# Patient Record
Sex: Female | Born: 1965 | Race: White | Hispanic: No | Marital: Married | Smoking: Never smoker
Health system: Southern US, Community
[De-identification: ages and names within clinical notes are randomized; demographics above are authoritative.]

## PROBLEM LIST (undated history)

## (undated) DIAGNOSIS — R1013 Epigastric pain: Secondary | ICD-10-CM

## (undated) DIAGNOSIS — B019 Varicella without complication: Secondary | ICD-10-CM

## (undated) DIAGNOSIS — R011 Cardiac murmur, unspecified: Secondary | ICD-10-CM

## (undated) DIAGNOSIS — R739 Hyperglycemia, unspecified: Secondary | ICD-10-CM

## (undated) DIAGNOSIS — E785 Hyperlipidemia, unspecified: Secondary | ICD-10-CM

## (undated) DIAGNOSIS — R42 Dizziness and giddiness: Secondary | ICD-10-CM

## (undated) DIAGNOSIS — R030 Elevated blood-pressure reading, without diagnosis of hypertension: Secondary | ICD-10-CM

## (undated) DIAGNOSIS — E039 Hypothyroidism, unspecified: Secondary | ICD-10-CM

## (undated) DIAGNOSIS — R001 Bradycardia, unspecified: Secondary | ICD-10-CM

## (undated) DIAGNOSIS — E559 Vitamin D deficiency, unspecified: Secondary | ICD-10-CM

## (undated) DIAGNOSIS — R7303 Prediabetes: Secondary | ICD-10-CM

## (undated) HISTORY — DX: Hypothyroidism, unspecified: E03.9

## (undated) HISTORY — DX: Bradycardia, unspecified: R00.1

## (undated) HISTORY — DX: Varicella without complication: B01.9

## (undated) HISTORY — DX: Hyperglycemia, unspecified: R73.9

## (undated) HISTORY — DX: Elevated blood-pressure reading, without diagnosis of hypertension: R03.0

## (undated) HISTORY — PX: OTHER SURGICAL HISTORY: SHX169

## (undated) HISTORY — DX: Hyperlipidemia, unspecified: E78.5

## (undated) HISTORY — DX: Vitamin D deficiency, unspecified: E55.9

## (undated) HISTORY — DX: Epigastric pain: R10.13

## (undated) HISTORY — DX: Cardiac murmur, unspecified: R01.1

## (undated) HISTORY — DX: Dizziness and giddiness: R42

---

## 2015-01-16 ENCOUNTER — Ambulatory Visit (INDEPENDENT_AMBULATORY_CARE_PROVIDER_SITE_OTHER): Payer: BLUE CROSS/BLUE SHIELD | Admitting: Adult Health

## 2015-01-16 ENCOUNTER — Encounter: Payer: Self-pay | Admitting: Adult Health

## 2015-01-16 VITALS — BP 132/90 | Temp 98.4°F | Ht 62.5 in | Wt 172.0 lb

## 2015-01-16 DIAGNOSIS — Z23 Encounter for immunization: Secondary | ICD-10-CM | POA: Diagnosis not present

## 2015-01-16 DIAGNOSIS — Z7189 Other specified counseling: Secondary | ICD-10-CM | POA: Diagnosis not present

## 2015-01-16 DIAGNOSIS — D1720 Benign lipomatous neoplasm of skin and subcutaneous tissue of unspecified limb: Secondary | ICD-10-CM

## 2015-01-16 DIAGNOSIS — Z7689 Persons encountering health services in other specified circumstances: Secondary | ICD-10-CM

## 2015-01-16 NOTE — Progress Notes (Signed)
HPI:  Brooke Khan is here to establish care. She is a very pleasant caucasian female who  has a past medical history of Chicken pox and Heart murmur.  Last PCP and physical: Unknown "years", she does see GYN. She has been living in [place] and [place] for the last 12 years.  Immunizations:UTD Diet: Cooks healthy. Trying to get rid of sweets in her diet.  Exercise:Tennis 3-4 times a week. Walks.  Colonoscopy:Not had one Pap Smear:10/2013 - Sees GYN Mammogram:10/2013 Dental: twice a year   Has the following chronic problems that require follow up and concerns today:  None  Has the following acute problems that require follow-up and concerns today:  1. " Swollen lymph node" in right axilla. Has been there for 1.5 years. No pain, redness, or warmth.   2. Mole on right shoulder that she has noticed has increased in size.   ROS negative for unless reported above: fevers, chills,feeling poorly, unintentional weight loss, hearing or vision loss, chest pain, palpitations, leg claudication, struggling to breath,Not feeling congested in the chest, no orthopenia, no cough,no wheezing, normal appetite, no soft tissue swelling, no hemoptysis, melena, hematochezia, hematuria, falls, loc, si, or thoughts of self harm.     Past Medical History  Diagnosis Date  . Chicken pox   . Heart murmur     History reviewed. No pertinent past surgical history.  Family History  Problem Relation Age of Onset  . Arthritis Maternal Grandfather   . Breast cancer Maternal Aunt   . Prostate cancer Father   . Elevated Lipids Father   . Elevated Lipids Mother   . Heart disease Father   . Hypertension Father   . Diabetes Father   . Diabetes Paternal Grandfather   . Diabetes Maternal Grandmother     Social History   Social History  . Marital Status: Unknown    Spouse Name: N/A  . Number of Children: N/A  . Years of Education: N/A   Social History Main Topics  . Smoking status: Never Smoker    . Smokeless tobacco: None  . Alcohol Use: 0.0 oz/week    0 Standard drinks or equivalent per week     Comment: occ; twice a month   . Drug Use: None  . Sexual Activity: Not Asked   Other Topics Concern  . None   Social History Narrative  . None     Current outpatient prescriptions:  Marland Kitchen  VITAMIN D, CHOLECALCIFEROL, PO, Take by mouth., Disp: , Rfl:   EXAM:   Filed Vitals:   01/16/15 1300  BP: 132/90  Temp: 98.4 F (36.9 C)    Body mass index is 30.94 kg/(m^2).  GENERAL: vitals reviewed and listed above, alert, oriented, appears well hydrated and in no acute distress  HEENT: atraumatic, conjunttiva clear, no obvious abnormalities on inspection of external nose and ears  NECK: Neck is soft and supple without masses, no adenopathy or thyromegaly, trachea midline, no JVD. Normal range of motion.   LUNGS: clear to auscultation bilaterally, no wheezes, rales or rhonchi, good air movement  CV: Regular rate and rhythm, normal S1/S2, soft murmur, no gallops, or rubs. No carotid bruit and no peripheral edema.   MS: moves all extremities without noticeable abnormality. No edema noted  Abd: soft/nontender/nondistended/normal bowel sounds   Skin: warm and dry, no rash. Small brown nevi on right upper arm, it does not look concerning at this point . Area of swelling in right axilla. Feels soft and fluctuant. No  pain or hardness. Does not appear as lymph node in nature. No redness or warmth noted.    Extremities: No clubbing, cyanosis, or edema. Capillary refill is WNL. Pulses intact bilaterally in upper and lower extremities.   Neuro: CN II-XII intact, sensation and reflexes normal throughout, 5/5 muscle strength in bilateral upper and lower extremities. Normal finger to nose. Normal rapid alternating movements.   PSYCH: pleasant and cooperative, no obvious depression or anxiety  ASSESSMENT AND PLAN: 1. Encounter to establish care - Follow up at next available appointment for  CPE - Follow up for any acute issue sooner, if needed - Ambulatory referral to Gastroenterology for Colonoscopy - Her blood pressure was slightly elevated at 132/90 in the office today. Will continue to monitor.   2. Lipoma of axilla - Likely Lipoma. Less likely lymphedema. No concern for abscess - Will continue to monitor.  - Consider consult to plastic surgery - Consider [place] in future.    3. Encounter for immunization - Flu vaccination given.    -We reviewed the PMH, PSH, FH, SH, Meds and Allergies. -We provided refills for any medications we will prescribe as needed. -We addressed current concerns per orders and patient instructions. -We have asked for records for pertinent exams, studies, vaccines and notes from previous providers. -We have advised patient to follow up per instructions below.   -Patient advised to return or notify a provider immediately if symptoms worsen or persist or new concerns arise.  Dorothyann Peng, AGNP

## 2015-01-16 NOTE — Progress Notes (Signed)
Pre visit review using our clinic review tool, if applicable. No additional management support is needed unless otherwise documented below in the visit note. 

## 2015-01-16 NOTE — Patient Instructions (Signed)
It was great meeting you today!  Please follow up in 1-2 months for a complete physical. If you need anything in the meantime, please let me know.   Continue to work on diet and exercise.

## 2015-03-14 ENCOUNTER — Other Ambulatory Visit (INDEPENDENT_AMBULATORY_CARE_PROVIDER_SITE_OTHER): Payer: BLUE CROSS/BLUE SHIELD

## 2015-03-14 DIAGNOSIS — R7989 Other specified abnormal findings of blood chemistry: Secondary | ICD-10-CM

## 2015-03-14 DIAGNOSIS — Z0000 Encounter for general adult medical examination without abnormal findings: Secondary | ICD-10-CM

## 2015-03-14 LAB — POCT URINALYSIS DIPSTICK
Bilirubin, UA: NEGATIVE
Blood, UA: NEGATIVE
Glucose, UA: NEGATIVE
Ketones, UA: NEGATIVE
Leukocytes, UA: NEGATIVE
Nitrite, UA: NEGATIVE
Protein, UA: NEGATIVE
Spec Grav, UA: 1.02
Urobilinogen, UA: 0.2
pH, UA: 7

## 2015-03-14 LAB — HEPATIC FUNCTION PANEL
ALT: 30 U/L (ref 0–35)
AST: 19 U/L (ref 0–37)
Albumin: 4.1 g/dL (ref 3.5–5.2)
Alkaline Phosphatase: 73 U/L (ref 39–117)
Bilirubin, Direct: 0.1 mg/dL (ref 0.0–0.3)
Total Bilirubin: 0.4 mg/dL (ref 0.2–1.2)
Total Protein: 7.3 g/dL (ref 6.0–8.3)

## 2015-03-14 LAB — BASIC METABOLIC PANEL
BUN: 18 mg/dL (ref 6–23)
CO2: 29 mEq/L (ref 19–32)
Calcium: 9.6 mg/dL (ref 8.4–10.5)
Chloride: 107 mEq/L (ref 96–112)
Creatinine, Ser: 0.76 mg/dL (ref 0.40–1.20)
GFR: 85.07 mL/min (ref >60.00)
Glucose, Bld: 104 mg/dL — ABNORMAL HIGH (ref 70–99)
Potassium: 4.4 mEq/L (ref 3.5–5.1)
Sodium: 143 mEq/L (ref 135–145)

## 2015-03-14 LAB — CBC WITH DIFFERENTIAL/PLATELET
Basophils Absolute: 0 10*3/uL (ref 0.0–0.1)
Basophils Relative: 0.6 % (ref 0.0–3.0)
Eosinophils Absolute: 0.4 10*3/uL (ref 0.0–0.7)
Eosinophils Relative: 5.3 % — ABNORMAL HIGH (ref 0.0–5.0)
HCT: 42.2 % (ref 36.0–46.0)
Hemoglobin: 14.2 g/dL (ref 12.0–15.0)
Lymphocytes Relative: 38.1 % (ref 12.0–46.0)
Lymphs Abs: 2.9 10*3/uL (ref 0.7–4.0)
MCHC: 33.7 g/dL (ref 30.0–36.0)
MCV: 90.5 fl (ref 78.0–100.0)
Monocytes Absolute: 0.4 10*3/uL (ref 0.1–1.0)
Monocytes Relative: 5.3 % (ref 3.0–12.0)
Neutro Abs: 3.9 10*3/uL (ref 1.4–7.7)
Neutrophils Relative %: 50.7 % (ref 43.0–77.0)
Platelets: 293 10*3/uL (ref 150.0–400.0)
RBC: 4.66 Mil/uL (ref 3.87–5.11)
RDW: 13.1 % (ref 11.5–15.5)
WBC: 7.7 10*3/uL (ref 4.0–10.5)

## 2015-03-14 LAB — LIPID PANEL
Cholesterol: 174 mg/dL (ref 0–200)
HDL: 37.1 mg/dL — ABNORMAL LOW (ref >39.00)
NonHDL: 136.6
Total CHOL/HDL Ratio: 5
Triglycerides: 215 mg/dL — ABNORMAL HIGH (ref 0.0–149.0)
VLDL: 43 mg/dL — ABNORMAL HIGH (ref 0.0–40.0)

## 2015-03-14 LAB — TSH: TSH: 5.86 u[IU]/mL — ABNORMAL HIGH (ref 0.35–4.50)

## 2015-03-14 LAB — LDL CHOLESTEROL, DIRECT: Direct LDL: 118 mg/dL

## 2015-03-20 ENCOUNTER — Encounter: Payer: Self-pay | Admitting: Adult Health

## 2015-03-20 ENCOUNTER — Ambulatory Visit (INDEPENDENT_AMBULATORY_CARE_PROVIDER_SITE_OTHER): Payer: BLUE CROSS/BLUE SHIELD | Admitting: Adult Health

## 2015-03-20 VITALS — BP 120/78 | HR 82 | Temp 98.6°F | Ht 63.19 in | Wt 175.9 lb

## 2015-03-20 DIAGNOSIS — R7989 Other specified abnormal findings of blood chemistry: Secondary | ICD-10-CM

## 2015-03-20 DIAGNOSIS — Z0000 Encounter for general adult medical examination without abnormal findings: Secondary | ICD-10-CM

## 2015-03-20 NOTE — Progress Notes (Signed)
Subjective:    Patient ID: Brooke Khan, female    DOB: 17-Aug-1965, 49 y.o.   MRN: [id number]  HPI  Patient presents for yearly preventative medicine examination. She  has a past medical history of Chicken pox and Heart murmur.  All immunizations and health maintenance protocols were reviewed with the patient and needed orders were placed.  Appropriate screening laboratory values were ordered for the patient including screening of hyperlipidemia, renal function and hepatic function.  Medication reconciliation,  past medical history, social history, problem list and allergies were reviewed in detail with the patient  Goals were established with regard to weight loss, exercise, and  diet in compliance with medications  End of life planning was discussed.- She does not have advanced directives or living will  She has been seen by her GYN and had her pap done recently.   Review of Systems  Constitutional: Negative.   HENT: Negative.   Eyes: Negative.   Respiratory: Negative.   Cardiovascular: Negative.   Gastrointestinal: Negative.   Endocrine: Negative.   Genitourinary: Negative.   Musculoskeletal: Negative.   Skin: Negative.   Allergic/Immunologic: Negative.   Neurological: Negative.   Hematological: Negative.   Psychiatric/Behavioral: Negative.   All other systems reviewed and are negative.  Past Medical History  Diagnosis Date  . Chicken pox   . Heart murmur     Social History   Social History  . Marital Status: Married    Spouse Name: N/A  . Number of Children: N/A  . Years of Education: N/A   Occupational History  . Not on file.   Social History Main Topics  . Smoking status: Never Smoker   . Smokeless tobacco: Not on file  . Alcohol Use: 0.0 oz/week    0 Standard drinks or equivalent per week     Comment: occ; twice a month   . Drug Use: No  . Sexual Activity: Not on file   Other Topics Concern  . Not on file   Social History Narrative   Does not work    Married for 26 years    Two children ( 75 and 65), both live in [place].    Has a beagle   She plays tennis and golf.     Past Surgical History  Procedure Laterality Date  . Cesarean section      Family History  Problem Relation Age of Onset  . Arthritis Maternal Grandfather   . Breast cancer Maternal Aunt   . Prostate cancer Father   . Elevated Lipids Father   . Elevated Lipids Mother   . Heart disease Father   . Hypertension Father   . Diabetes Father   . Diabetes Paternal Grandfather   . Diabetes Maternal Grandmother     Allergies  Allergen Reactions  . Codeine     Current Outpatient Prescriptions on File Prior to Visit  Medication Sig Dispense Refill  . VITAMIN D, CHOLECALCIFEROL, PO Take by mouth.     No current facility-administered medications on file prior to visit.    BP 120/78 mmHg  Pulse 82  Temp(Src) 98.6 F (37 C) (Oral)  Ht 5' 3.19" (1.605 m)  Wt 175 lb 14.4 oz (79.788 kg)  BMI 30.97 kg/m2  LMP 05/11/2014       Objective:   Physical Exam  Constitutional: She is oriented to person, place, and time. She appears well-developed and well-nourished. No distress.  Slightly obese  HENT:  Head: Normocephalic and atraumatic.  Right Ear:  External ear normal.  Left Ear: External ear normal.  Nose: Nose normal.  Mouth/Throat: Oropharynx is clear and moist. No oropharyngeal exudate.  Eyes: Conjunctivae and EOM are normal. Pupils are equal, round, and reactive to light. Right eye exhibits no discharge. Left eye exhibits no discharge. No scleral icterus.  Neck: Normal range of motion. Neck supple. No JVD present. No tracheal deviation present. No thyromegaly present.  Cardiovascular: Normal rate, regular rhythm, normal heart sounds and intact distal pulses.  Exam reveals no gallop and no friction rub.   No murmur heard. No carotid bruit  Pulmonary/Chest: Effort normal and breath sounds normal. No respiratory distress. She has no wheezes. She  has no rales. She exhibits no tenderness.  Abdominal: Soft. Bowel sounds are normal. She exhibits no distension and no mass. There is no tenderness. There is no rebound and no guarding.  Genitourinary:  Deferred, done by GYN  Musculoskeletal: Normal range of motion.  Lymphadenopathy:    She has no cervical adenopathy.  Neurological: She is alert and oriented to person, place, and time. She has normal reflexes. No cranial nerve deficit. Coordination normal.  Skin: Skin is warm and dry. No rash noted. She is not diaphoretic. No erythema. No pallor.  Scattered non malignant appearing moles on torso  Psychiatric: She has a normal mood and affect. Her behavior is normal. Judgment and thought content normal.  Nursing note and vitals reviewed.     Assessment & Plan:  1. Abnormal TSH - Has no symptoms of hypothyroidism - TSH; Future  2. Routine general medical examination at a health care facility - Follow up in one year for CPE - Follow up sooner if needed - Stressed importance of diet and exercise.  - She will schedule her colonoscopy.

## 2015-03-20 NOTE — Patient Instructions (Addendum)
It was great seeing you again!  Please follow up in 1-2 months to have your thyroid level checked again.   Call and schedule your colonoscopy  [facility]  Address: [address], [place], [place] [address]  [phone]   Follow up in one year for a complete physical exam or sooner if needed.

## 2015-03-20 NOTE — Progress Notes (Signed)
Pre visit review using our clinic review tool, if applicable. No additional management support is needed unless otherwise documented below in the visit note. 

## 2015-04-30 ENCOUNTER — Other Ambulatory Visit (INDEPENDENT_AMBULATORY_CARE_PROVIDER_SITE_OTHER): Payer: BLUE CROSS/BLUE SHIELD

## 2015-04-30 DIAGNOSIS — R7989 Other specified abnormal findings of blood chemistry: Secondary | ICD-10-CM

## 2015-04-30 LAB — TSH: TSH: 3.89 u[IU]/mL (ref 0.35–4.50)

## 2015-06-07 ENCOUNTER — Encounter: Payer: Self-pay | Admitting: Gastroenterology

## 2015-07-29 ENCOUNTER — Ambulatory Visit (AMBULATORY_SURGERY_CENTER): Payer: Self-pay | Admitting: *Deleted

## 2015-07-29 VITALS — Ht 62.0 in | Wt 167.0 lb

## 2015-07-29 DIAGNOSIS — Z1211 Encounter for screening for malignant neoplasm of colon: Secondary | ICD-10-CM

## 2015-07-29 NOTE — Progress Notes (Signed)
No egg or soy allergy known to patient  No issues with past sedation with any surgeries  or procedures, no intubation problems  No diet pills per patient No home 02 use per patient  No blood thinners per patient  Pt denies issues with constipation  emmi video declined

## 2015-08-05 ENCOUNTER — Ambulatory Visit (AMBULATORY_SURGERY_CENTER): Payer: BLUE CROSS/BLUE SHIELD | Admitting: Gastroenterology

## 2015-08-05 ENCOUNTER — Encounter: Payer: Self-pay | Admitting: Gastroenterology

## 2015-08-05 VITALS — BP 110/86 | HR 56 | Temp 99.1°F | Resp 10 | Ht 62.0 in | Wt 167.0 lb

## 2015-08-05 DIAGNOSIS — D123 Benign neoplasm of transverse colon: Secondary | ICD-10-CM | POA: Diagnosis not present

## 2015-08-05 DIAGNOSIS — Z1211 Encounter for screening for malignant neoplasm of colon: Secondary | ICD-10-CM | POA: Diagnosis not present

## 2015-08-05 MED ORDER — SODIUM CHLORIDE 0.9 % IV SOLN: 500.0000 mL | INTRAVENOUS | Status: DC

## 2015-08-05 NOTE — Progress Notes (Signed)
Called to room to assist during endoscopic procedure.  Patient ID and intended procedure confirmed with present staff. Received instructions for my participation in the procedure from the performing physician.  

## 2015-08-05 NOTE — Op Note (Signed)
[facility] Patient Name: Brooke Khan Procedure Date: 08/05/2015 11:37 AM MRN: [id number] Endoscopist: Mallie Mussel L. Loletha Carrow , MD Age: 50 Referring MD:  Date of Birth: 27-Apr-1966 Gender: Female Procedure:                Colonoscopy Indications:              Screening for colorectal malignant neoplasm Medicines:                Monitored Anesthesia Care Procedure:                Pre-Anesthesia Assessment:                           - Prior to the procedure, a History and Physical                            was performed, and patient medications and                            allergies were reviewed. The patient's tolerance of                            previous anesthesia was also reviewed. The risks                            and benefits of the procedure and the sedation                            options and risks were discussed with the patient.                            All questions were answered, and informed consent                            was obtained. Prior Anticoagulants: The patient has                            taken no previous anticoagulant or antiplatelet                            agents. ASA Grade Assessment: I - A normal, healthy                            patient. After reviewing the risks and benefits,                            the patient was deemed in satisfactory condition to                            undergo the procedure.                           After obtaining informed consent, the colonoscope  was passed under direct vision. Throughout the                            procedure, the patient's blood pressure, pulse, and                            oxygen saturations were monitored continuously. The                            Model PCF-H190L [id number]) scope was introduced                            through the anus and advanced to the the cecum,                            identified by appendiceal orifice and ileocecal                           valve. The colonoscopy was performed without                            difficulty. The patient tolerated the procedure                            well. The quality of the bowel preparation was                            good. The ileocecal valve, appendiceal orifice, and                            rectum were photographed. The bowel preparation                            used was Miralax. The quality of the bowel                            preparation was evaluated using the BBPS [facility]                            Bowel Preparation Scale) with scores of: Right                            Colon = 2, Transverse Colon = 2 and Left Colon = 2.                            The total BBPS score equals 6. Scope In: 11:51:20 AM Scope Out: 12:10:04 PM Scope Withdrawal Time: 0 hours 13 minutes 38 seconds  Total Procedure Duration: 0 hours 18 minutes 44 seconds  Findings:      The perianal and digital rectal examinations were normal.      A 4 mm polyp was found in the splenic flexure. The polyp was sessile.       The polyp was removed with a piecemeal technique using a cold biopsy  forceps. Resection and retrieval were complete.      The exam was otherwise without abnormality on direct and retroflexion       views. Complications:            No immediate complications. Estimated Blood Loss:     Estimated blood loss was minimal. Impression:               - One 4 mm polyp at the splenic flexure, removed                            piecemeal using a cold biopsy forceps. Resected and                            retrieved.                           - The examination was otherwise normal on direct                            and retroflexion views. Recommendation:           - Patient has a contact number available for                            emergencies. The signs and symptoms of potential                            delayed complications were discussed with the                             patient. Return to normal activities tomorrow.                            Written discharge instructions were provided to the                            patient.                           - Resume previous diet.                           - Continue present medications.                           - Await pathology results.                           - Repeat colonoscopy is recommended for                            surveillance. The colonoscopy date will be                            determined after pathology results from today's  exam become available for review. Procedure Code(s):        --- Professional ---                           [phone], Colonoscopy, flexible; with biopsy, single                            or multiple CPT copyright 2016 American Medical Association. All rights reserved. Amita Atayde L. Loletha Carrow, MD 08/05/2015 12:17:23 PM This report has been signed electronically. Number of Addenda: 0 Referring MD:      Dorothyann Peng

## 2015-08-05 NOTE — Progress Notes (Signed)
Report given to RN, vss

## 2015-08-05 NOTE — Patient Instructions (Signed)
YOU HAD AN ENDOSCOPIC PROCEDURE TODAY AT [facility]:   Refer to the procedure report that was given to you for any specific questions about what was found during the examination.  If the procedure report does not answer your questions, please call your gastroenterologist to clarify.  If you requested that your care partner not be given the details of your procedure findings, then the procedure report has been included in a sealed envelope for you to review at your convenience later.  YOU SHOULD EXPECT: Some feelings of bloating in the abdomen. Passage of more gas than usual.  Walking can help get rid of the air that was put into your GI tract during the procedure and reduce the bloating. If you had a lower endoscopy (such as a colonoscopy or flexible sigmoidoscopy) you may notice spotting of blood in your stool or on the toilet paper. If you underwent a bowel prep for your procedure, you may not have a normal bowel movement for a few days.  Please Note:  You might notice some irritation and congestion in your nose or some drainage.  This is from the oxygen used during your procedure.  There is no need for concern and it should clear up in a day or so.  SYMPTOMS TO REPORT IMMEDIATELY:   Following lower endoscopy (colonoscopy or flexible sigmoidoscopy):  Excessive amounts of blood in the stool  Significant tenderness or worsening of abdominal pains  Swelling of the abdomen that is new, acute  Fever of 100F or higher  For urgent or emergent issues, a gastroenterologist can be reached at any hour by calling [phone].   DIET: Your first meal following the procedure should be a small meal and then it is ok to progress to your normal diet. Heavy or fried foods are harder to digest and may make you feel nauseous or bloated.  Likewise, meals heavy in dairy and vegetables can increase bloating.  Drink plenty of fluids but you should avoid alcoholic beverages for 24  hours.  ACTIVITY:  You should plan to take it easy for the rest of today and you should NOT DRIVE or use heavy machinery until tomorrow (because of the sedation medicines used during the test).    FOLLOW UP: Our staff will call the number listed on your records the next business day following your procedure to check on you and address any questions or concerns that you may have regarding the information given to you following your procedure. If we do not reach you, we will leave a message.  However, if you are feeling well and you are not experiencing any problems, there is no need to return our call.  We will assume that you have returned to your regular daily activities without incident.  If any biopsies were taken you will be contacted by phone or by letter within the next 1-3 weeks.  Please call us at [phone] if you have not heard about the biopsies in 3 weeks.    SIGNATURES/CONFIDENTIALITY: You and/or your care partner have signed paperwork which will be entered into your electronic medical record.  These signatures attest to the fact that that the information above on your After Visit Summary has been reviewed and is understood.  Full responsibility of the confidentiality of this discharge information lies with you and/or your care-partner.  Polyp handout given Await pathology results Resume previous medication and diet

## 2015-08-06 ENCOUNTER — Telehealth: Payer: Self-pay

## 2015-08-06 NOTE — Telephone Encounter (Signed)
  Follow up Call-  Call back number 08/05/2015  Post procedure Call Back phone  # [phone]  Permission to leave phone message Yes     Patient was called for follow up phone call after her procedure on 08/05/2015. No answer at the number given for follow up phone call. A message was left on the answering machine.

## 2015-08-09 ENCOUNTER — Encounter: Payer: Self-pay | Admitting: Gastroenterology

## 2015-10-28 ENCOUNTER — Telehealth: Payer: Self-pay | Admitting: Adult Health

## 2015-10-28 NOTE — Telephone Encounter (Signed)
[facility]  Patient Name: Brooke Khan  DOB: 1965-09-06    Initial Comment Caller states for the past 2 1/2 months, she started having vaginal bleeding and pain/pressure in pelvis. She is also having pain in her back.   Nurse Assessment      Guidelines    Guideline Title Affirmed Question Affirmed Notes       Final Disposition User   FINAL ATTEMPT MADE - no message left Harlow Mares, Therapist, sports, Suanne Marker

## 2015-10-29 NOTE — Telephone Encounter (Signed)
Pt has been scheduled.  °

## 2015-10-30 ENCOUNTER — Encounter: Payer: Self-pay | Admitting: Family Medicine

## 2015-10-30 ENCOUNTER — Ambulatory Visit (INDEPENDENT_AMBULATORY_CARE_PROVIDER_SITE_OTHER): Payer: BLUE CROSS/BLUE SHIELD | Admitting: Family Medicine

## 2015-10-30 VITALS — BP 110/80 | HR 65 | Temp 98.2°F | Resp 12 | Ht 62.0 in | Wt 161.1 lb

## 2015-10-30 DIAGNOSIS — R1032 Left lower quadrant pain: Secondary | ICD-10-CM

## 2015-10-30 DIAGNOSIS — R1031 Right lower quadrant pain: Secondary | ICD-10-CM

## 2015-10-30 DIAGNOSIS — M545 Low back pain, unspecified: Secondary | ICD-10-CM

## 2015-10-30 MED ORDER — CYCLOBENZAPRINE HCL 10 MG PO TABS: 10.0000 mg | ORAL_TABLET | Freq: Every day | ORAL | Status: AC

## 2015-10-30 NOTE — Patient Instructions (Signed)
A few things to remember from today's visit:   [name] I have seen you today for an acute visit because your primary care provider was not available. Monitor for signs of worsening symptoms and seek immediate medical attention if any concerning/warning symptom as we discussed.   Please continue following with your PCP , schedule a follow up appt in 4-6 weeks, before if symptoms are worse.  1. Bilateral lower abdominal pain  - CT Abdomen Pelvis W Contrast; Future  2. Bilateral low back pain without sciatica  - cyclobenzaprine (FLEXERIL) 10 MG tablet; Take 1 tablet (10 mg total) by mouth at bedtime.  Dispense: 30 tablet; Refill: 0   Back pain is very common in adults.The cause of back pain is rarely dangerous and the pain often gets better over time even with no pharmacologic treatment.  The cause of your back pain may not be known. Some common causes of back pain include: 1. Strain of the muscles or ligaments supporting the spine. 2. Wear and tear (degeneration) of the spinal disks. 3. Arthritis. 4. Direct injury to the back. 5.  For many people, back pain may return. Since back pain is rarely dangerous, most people can learn to manage this condition on their own.  HOME CARE INSTRUCTIONS Watch your back pain for any changes. The following actions may help to lessen any discomfort you are feeling: 1. Remain active. It is stressful on your back to sit or stand in one place for long periods of time. Do not sit, drive, or stand in one place for more than 30 minutes at a time. Take short walks on even surfaces as soon as you are able.Try to increase the length of time you walk each day. 2.  3. Exercise regularly as directed by your health care provider. Exercise helps your back heal faster. It also helps avoid future injury by keeping your muscles strong and flexible.  4. Do not stay in bed.Resting more than 1-2 days can delay your recovery.                           5. Pay attention to your body when you bend and lift. The most comfortable positions are those that put less stress on your recovering back.  6.  Always use proper lifting techniques, including: 1. Bending your knees. 2. Keeping the load close to your body. 3. Avoiding twisting.  7. Find a comfortable position to sleep. Use a firm mattress and lie on your side with your knees slightly bent. If you lie on your back, put a pillow under your knees.  8. Over the counter rubbing medications like Icy Hot or local heat might help.  Acetaminophen and/or Aleve/Ibuprofen can be taken if needed and if not contraindications. Local ice and heat may be alternated to reduce pain and spasms.     Muscle relaxants might or might not help, they cause drowsiness among other    side effects. They could also interact with some of medications you may be already taking (medications for depression/anxiety and some pain medications).   9. Maintain a healthy weight. Excess weight puts extra stress on your back and makes it difficult to maintain good posture.   SEEK MEDICAL CARE IF: worsening pain, associated fever, rash/edema on area, pain going to legs or buttocks, numbness/tingling, night pain, or abnormal weight loss.    SEEK IMMEDIATE MEDICAL CARE IF:  1. You develop new bowel or bladder control  problems. 2. You have unusual weakness or numbness in your arms or legs. 3. You develop nausea or vomiting. 4. You develop abdominal pain. 5. You feel faint.     Back Exercises The following exercises strengthen the muscles that help to support the back. They also help to keep the lower back flexible. Doing these exercises can help to prevent back pain or lessen existing pain. If you have back pain or discomfort, try doing these exercises 2-3 times each day or as told by your health care provider. When the pain goes away, do them once each day, but increase the number of times that you repeat  the steps for each exercise (do more repetitions). If you do not have back pain or discomfort, do these exercises once each day or as told by your health care provider.   EXERCISES Single Knee to Chest Repeat these steps 3-5 times for each leg: 6. Lie on your back on a firm bed or the floor with your legs extended. 7. Bring one knee to your chest. Your other leg should stay extended and in contact with the floor. 8. Hold your knee in place by grabbing your knee or thigh. 9. Pull on your knee until you feel a gentle stretch in your lower back. 10. Hold the stretch for 10-30 seconds. 11. Slowly release and straighten your leg.  Pelvic Tilt Repeat these steps 5-10 times: 10. Lie on your back on a firm bed or the floor with your legs extended. Emerson your knees so they are pointing toward the ceiling and your feet are flat on the floor. 12. Tighten your lower abdominal muscles to press your lower back against the floor. This motion will tilt your pelvis so your tailbone points up toward the ceiling instead of pointing to your feet or the floor. 13. With gentle tension and even breathing, hold this position for 5-10 seconds.  Cat-Cow Repeat these steps until your lower back becomes more flexible: 1. Get into a hands-and-knees position on a firm surface. Keep your hands under your shoulders, and keep your knees under your hips. You may place padding under your knees for comfort. 2. Let your head hang down, and point your tailbone toward the floor so your lower back becomes rounded like the back of a cat. 3. Hold this position for 5 seconds. 4. Slowly lift your head and point your tailbone up toward the ceiling so your back forms a sagging arch like the back of a cow. 5. Hold this position for 5 seconds.   Press-Ups Repeat these steps 5-10 times: 6. Lie on your abdomen (face-down) on the floor. 7. Place your palms near your head, about shoulder-width apart. 8. While you keep your back as  relaxed as possible and keep your hips on the floor, slowly straighten your arms to raise the top half of your body and lift your shoulders. Do not use your back muscles to raise your upper torso. You may adjust the placement of your hands to make yourself more comfortable. 9. Hold this position for 5 seconds while you keep your back relaxed. 10. Slowly return to lying flat on the floor.   Bridges Repeat these steps 10 times: 1. Lie on your back on a firm surface. 2. Bend your knees so they are pointing toward the ceiling and your feet are flat on the floor. 3. Tighten your buttocks muscles and lift your buttocks off of the floor until your waist is at almost the same height  as your knees. You should feel the muscles working in your buttocks and the back of your thighs. If you do not feel these muscles, slide your feet 1-2 inches farther away from your buttocks. 4. Hold this position for 3-5 seconds. 5. Slowly lower your hips to the starting position, and allow your buttocks muscles to relax completely. If this exercise is too easy, try doing it with your arms crossed over your chest.

## 2015-10-30 NOTE — Progress Notes (Signed)
HPI:   [name] is a 50 y.o. female, who is here today complaining of lower abdominal pain.  She started reporting an episode of vaginal bleeding associated to lower abdominal pain in 08/2015, evaluated by gyn and reporting negative pelvic u/s and endometrial Bx. She has not had any other episode of vaginal bleeding but abdominal pain has not resolved, it has improved though.  Pain is like dull/pressure, mainly suprapubic but sometimes feels it LLQ or RLQ close to groin area, max 5/10, exacerbated by walking/activities during the day, relieved by lying down, it is present about 80 % of the time and starts in the morning a few minutes after being up.   Denies nausea, vomiting, changes in bowel habits, blood in stool or melena. Daily bowel movements, last one today. Reporting colonoscopy in 07/2015, polypectomy. No urinary symptoms. No tobacco use.  Mid and lower back pain, not sure if it is associated with abdominal pain but started also in 08/2015 and usually present at the same time.  Lower back pain is sometimes radiated to buttocks but no to LE, no numbness or tingling, no saddle anesthesia,. No weakness, or limitation of ROM. Achy sensation, mild. No urine or bowel incontinence. Sometimes she takes OTC Ibuprofen and helps.  No prior Hx of back pain, no hx of injuries.  Lab work done a few weeks ago, according to pt, everything was normal: sugar,urine among some. We do not have these results.    Lab Results  Component Value Date   WBC 7.7 03/14/2015   HGB 14.2 03/14/2015   HCT 42.2 03/14/2015   MCV 90.5 03/14/2015   PLT 293.0 03/14/2015     Chemistry      Component Value Date/Time   NA 143 03/14/2015 0832   K 4.4 03/14/2015 0832   CL 107 03/14/2015 0832   CO2 29 03/14/2015 0832   BUN 18 03/14/2015 0832   CREATININE 0.76 03/14/2015 0832      Component Value Date/Time   CALCIUM 9.6 03/14/2015 0832   ALKPHOS 73 03/14/2015 0832   AST 19 03/14/2015  0832   ALT 30 03/14/2015 0832   BILITOT 0.4 03/14/2015 0832       Review of Systems  Constitutional: Negative for fever, activity change, appetite change, fatigue and unexpected weight change.  HENT: Negative for mouth sores, sore throat and trouble swallowing.   Respiratory: Negative for cough, shortness of breath and wheezing.   Cardiovascular: Negative for chest pain, palpitations and leg swelling.  Gastrointestinal: Positive for abdominal pain. Negative for nausea, vomiting and blood in stool.       Negative for changes in bowel habits or fecal incontinence.  Genitourinary: Negative for dysuria, frequency, hematuria, decreased urine volume, vaginal bleeding and vaginal discharge.       Negative for urine incontinence.  Musculoskeletal: Positive for back pain. Negative for joint swelling, arthralgias, gait problem and neck pain.  Skin: Negative for rash.  Neurological: Negative for syncope, weakness, numbness and headaches.  Hematological: Negative for adenopathy. Does not bruise/bleed easily.  Psychiatric/Behavioral: Negative for confusion and sleep disturbance. The patient is nervous/anxious.       Current Outpatient Prescriptions on File Prior to Visit  Medication Sig Dispense Refill  . Calcium Citrate-Vitamin D (CALCIUM + D PO) Take 1 capsule by mouth daily.    . Cyanocobalamin (VITAMIN B 12 PO) Take 1 capsule by mouth daily.    Marland Kitchen VITAMIN D, CHOLECALCIFEROL, PO Take by mouth.  No current facility-administered medications on file prior to visit.     Past Medical History  Diagnosis Date  . Chicken pox   . Heart murmur   . Hyperlipidemia     borderline- no meds   Allergies  Allergen Reactions  . Codeine Nausea And Vomiting    Social History   Social History  . Marital Status: Married    Spouse Name: N/A  . Number of Children: N/A  . Years of Education: N/A   Social History Main Topics  . Smoking status: Never Smoker   . Smokeless tobacco: Never Used  .  Alcohol Use: 0.0 oz/week    0 Standard drinks or equivalent per week     Comment: occ; twice a month   . Drug Use: No  . Sexual Activity: Not Asked   Other Topics Concern  . None   Social History Narrative   Does not work    Married for 26 years    Two children ( 71 and 18), both live in [place].    Has a beagle   She plays tennis and golf.     Filed Vitals:   10/30/15 0928  BP: 110/80  Pulse: 65  Temp: 98.2 F (36.8 C)  Resp: 12   Body mass index is 29.46 kg/(m^2).  SpO2 Readings from Last 3 Encounters:  10/30/15 98%  08/05/15 90%       Physical Exam  Constitutional: She is oriented to person, place, and time. She appears well-developed. She does not appear ill. No distress.  HENT:  Head: Atraumatic.  Mouth/Throat: Oropharynx is clear and moist and mucous membranes are normal.  Eyes: Conjunctivae are normal.  Neck: No tracheal deviation present. No thyromegaly present.  Cardiovascular: Normal rate and regular rhythm.   No murmur heard. Pulses:      Dorsalis pedis pulses are 2+ on the right side, and 2+ on the left side.  Respiratory: Effort normal and breath sounds normal. No respiratory distress.  GI: Soft. Normal appearance. She exhibits no distension, no abdominal bruit and no mass. There is no hepatosplenomegaly. There is no tenderness. Hernia confirmed negative in the right inguinal area and confirmed negative in the left inguinal area.  Musculoskeletal: She exhibits no edema.       Right hip: She exhibits normal range of motion and normal strength.       Left hip: She exhibits normal range of motion and no tenderness.   No tenderness upon palpation of paraspinal muscles. Pain not elicited with movement on exam table during examination.     Lymphadenopathy:    She has no cervical adenopathy.       Right: No supraclavicular adenopathy present.       Left: No supraclavicular adenopathy present.  Neurological: She is alert and oriented to person, place, and  time. She has normal strength. Coordination and gait normal.  SLR negative bilateral.  Skin: Skin is warm. No rash noted. No erythema.  Psychiatric: Her speech is normal. Her mood appears anxious.  Well groomed, good eye contact.      ASSESSMENT AND PLAN:     Jeremi was seen today for vaginal bleeding and back pain.  Diagnoses and all orders for this visit:  Bilateral lower abdominal pain  Possible causes discussed, today examination was negative. Because persistent abdominal pain + pt's concern abdominal/pelvic CT will be arranged. Because she is reporting blood work done a few weeks ago, so I do not think labs are needed today. She  will try to obtain a copy of labs before her follow-up appointment.  -     CT Abdomen Pelvis W Contrast; Future   Bilateral low back pain without sciatica  Not sure if related to her abdominal pain, it seems musculoskeletal. I don't think imaging is needed today, she will try local heat and muscle relaxant at bed time. Follow-up in 6 weeks before if needed.   -     cyclobenzaprine (FLEXERIL) 10 MG tablet; Take 1 tablet (10 mg total) by mouth at bedtime.           -She was advised to return or notify a doctor immediately if symptoms worsen or persist or new concerns arise.       Ediel Unangst G. Martinique, MD  [facility]. [place] office.

## 2015-10-30 NOTE — Progress Notes (Signed)
Pre visit review using our clinic review tool, if applicable. No additional management support is needed unless otherwise documented below in the visit note. 

## 2015-11-04 ENCOUNTER — Encounter (INDEPENDENT_AMBULATORY_CARE_PROVIDER_SITE_OTHER): Payer: Self-pay

## 2015-11-04 ENCOUNTER — Telehealth: Payer: Self-pay | Admitting: Adult Health

## 2015-11-04 NOTE — Telephone Encounter (Signed)
Pt would like to have something to calm her nerves down for the CT that will take place on 6/30 (Friday).

## 2015-11-05 ENCOUNTER — Other Ambulatory Visit: Payer: Self-pay | Admitting: Adult Health

## 2015-11-05 MED ORDER — ALPRAZOLAM 0.5 MG PO TABS: 0.5000 mg | ORAL_TABLET | Freq: Once | ORAL | Status: DC

## 2015-11-05 NOTE — Telephone Encounter (Signed)
She can have a single dose of xanax. She will need to have someone there to drive her home

## 2015-11-05 NOTE — Telephone Encounter (Signed)
Rx faxed to pharmacy  

## 2015-11-06 ENCOUNTER — Other Ambulatory Visit: Payer: BLUE CROSS/BLUE SHIELD

## 2015-11-08 ENCOUNTER — Ambulatory Visit
Admission: RE | Admit: 2015-11-08 | Discharge: 2015-11-08 | Disposition: A | Discharge status: Home / Self Care | Payer: BLUE CROSS/BLUE SHIELD | Source: Ambulatory Visit | Attending: Family Medicine | Admitting: Family Medicine

## 2015-11-08 DIAGNOSIS — R1031 Right lower quadrant pain: Secondary | ICD-10-CM

## 2015-11-08 DIAGNOSIS — R1032 Left lower quadrant pain: Secondary | ICD-10-CM

## 2015-11-08 MED ORDER — IOPAMIDOL (ISOVUE-300) INJECTION 61%
100.0000 mL | Freq: Once | INTRAVENOUS | Status: AC | PRN
  Administered 2015-11-08: 100 mL via INTRAVENOUS

## 2015-11-10 ENCOUNTER — Encounter: Payer: Self-pay | Admitting: Family Medicine

## 2015-11-28 ENCOUNTER — Telehealth: Payer: Self-pay | Admitting: Adult Health

## 2015-11-28 NOTE — Telephone Encounter (Signed)
Please advise 

## 2015-11-28 NOTE — Telephone Encounter (Signed)
Spoke to the pt.  Informed her that Tommi Rumps has agreed to order hep c screen when she comes to see him on 12/24/15

## 2015-11-28 NOTE — Telephone Encounter (Signed)
Pt would like to have a Hep C test 12/24/15.  May I have a order for this?

## 2015-11-28 NOTE — Telephone Encounter (Signed)
That is fine 

## 2015-12-05 ENCOUNTER — Ambulatory Visit: Payer: BLUE CROSS/BLUE SHIELD | Admitting: Adult Health

## 2015-12-24 ENCOUNTER — Ambulatory Visit (INDEPENDENT_AMBULATORY_CARE_PROVIDER_SITE_OTHER): Payer: Managed Care, Other (non HMO) | Admitting: Adult Health

## 2015-12-24 ENCOUNTER — Encounter: Payer: Self-pay | Admitting: Adult Health

## 2015-12-24 VITALS — BP 132/64 | Temp 98.6°F | Ht 62.0 in | Wt 164.2 lb

## 2015-12-24 DIAGNOSIS — R1084 Generalized abdominal pain: Secondary | ICD-10-CM | POA: Diagnosis not present

## 2015-12-24 DIAGNOSIS — Z1159 Encounter for screening for other viral diseases: Secondary | ICD-10-CM | POA: Diagnosis not present

## 2015-12-24 NOTE — Patient Instructions (Signed)
It was great seeing you again and I am happy that your symptoms have resolved  I will follow up with you regarding your blood work

## 2015-12-24 NOTE — Progress Notes (Signed)
Subjective:    Patient ID: Brooke Khan, female    DOB: [birth year], 50 y.o.   MRN: [id number]  HPI  50 year old female who presents to the office today for follow up regarding lower abdominal pain. She saw Dr. Martinique on 10/30/2015 for lower abdominal pain, Pain was  dull/pressure, mainly suprapubic but sometimes feels it LLQ or RLQ close to groin area, max 5/10, exacerbated by walking/activities during the day, relieved by lying down, it is present about 80 % of the time and starts in the morning a few minutes after being up.  She was prescribed Flexeril and a CT was ordered  Today in the office she reports that since having her CT that all of her symptoms have resolved and she is feeling " much better".   IMPRESSION: 1. No specific explanation for lower abdominal pain. 2. 5 mm gallbladder polyp or less likely floating stone. 3. Punctate left nephrolithiasis. 4. 3 cm intramural uterine fibroid. 5.  Aortic Atherosclerosis (ICD10-170.0)  She would like to have testing done for Hep C  Review of Systems  Constitutional: Negative.   Cardiovascular: Negative.   Gastrointestinal: Negative.   Genitourinary: Negative.   Hematological: Negative.   All other systems reviewed and are negative.  Past Medical History:  Diagnosis Date  . Chicken pox   . Heart murmur   . Hyperlipidemia    borderline- no meds    Social History   Social History  . Marital status: Married    Spouse name: N/A  . Number of children: N/A  . Years of education: N/A   Occupational History  . Not on file.   Social History Main Topics  . Smoking status: Never Smoker  . Smokeless tobacco: Never Used  . Alcohol use 0.0 oz/week     Comment: occ; twice a month   . Drug use: No  . Sexual activity: Not on file   Other Topics Concern  . Not on file   Social History Narrative   Does not work    Married for 26 years    Two children ( 96 and 33), both live in [place].    Has a beagle   She plays  tennis and golf.     Past Surgical History:  Procedure Laterality Date  . CESAREAN SECTION     x2  . cosmetic knee surgery     as child    Family History  Problem Relation Age of Onset  . Arthritis Maternal Grandfather   . Breast cancer Maternal Aunt   . Prostate cancer Father   . Elevated Lipids Father   . Heart disease Father   . Hypertension Father   . Diabetes Father   . Elevated Lipids Mother   . Diabetes Paternal Grandfather   . Diabetes Maternal Grandmother   . Colon cancer Neg Hx   . Colon polyps Neg Hx   . Esophageal cancer Neg Hx   . Rectal cancer Neg Hx   . Stomach cancer Neg Hx     Allergies  Allergen Reactions  . Codeine Nausea And Vomiting    Current Outpatient Prescriptions on File Prior to Visit  Medication Sig Dispense Refill  . ALPRAZolam (XANAX) 0.5 MG tablet Take 1 tablet (0.5 mg total) by mouth once. 1 tablet 0  . Calcium Citrate-Vitamin D (CALCIUM + D PO) Take 1 capsule by mouth daily.    . Cyanocobalamin (VITAMIN B 12 PO) Take 1 capsule by mouth daily.    Marland Kitchen  VITAMIN D, CHOLECALCIFEROL, PO Take by mouth.     No current facility-administered medications on file prior to visit.     BP 132/64   Temp 98.6 F (37 C) (Oral)   Ht 5\' 2"  (1.575 m)   Wt 164 lb 3.2 oz (74.5 kg)   LMP 05/11/2014   BMI 30.03 kg/m       Objective:   Physical Exam  Constitutional: She is oriented to person, place, and time. She appears well-developed and well-nourished. No distress.  Cardiovascular: Normal rate, regular rhythm, normal heart sounds and intact distal pulses.  Exam reveals no gallop and no friction rub.   No murmur heard. Pulmonary/Chest: Effort normal and breath sounds normal. No respiratory distress. She has no wheezes. She has no rales. She exhibits no tenderness.  Abdominal: Soft. Bowel sounds are normal. She exhibits no distension and no mass. There is no tenderness. There is no rebound and no guarding.  Neurological: She is alert and oriented to  person, place, and time.  Skin: Skin is warm and dry. No rash noted. She is not diaphoretic. No erythema. No pallor.  Psychiatric: She has a normal mood and affect. Her behavior is normal. Judgment and thought content normal.  Nursing note and vitals reviewed.     Assessment & Plan:  1. Generalized abdominal pain - Appears to have resolved - CT scan reviewed with patient and all questions answered - Follow up as needed  2. Need for hepatitis C screening test - Hep C Antibody   Dorothyann Peng, AGNP

## 2015-12-25 LAB — HEPATITIS C ANTIBODY: HCV Ab: NEGATIVE

## 2017-01-29 ENCOUNTER — Encounter: Payer: Self-pay | Admitting: Adult Health

## 2017-05-27 DIAGNOSIS — Z13220 Encounter for screening for lipoid disorders: Secondary | ICD-10-CM | POA: Diagnosis not present

## 2017-05-27 DIAGNOSIS — Z1321 Encounter for screening for nutritional disorder: Secondary | ICD-10-CM | POA: Diagnosis not present

## 2017-05-27 DIAGNOSIS — Z131 Encounter for screening for diabetes mellitus: Secondary | ICD-10-CM | POA: Diagnosis not present

## 2017-05-27 DIAGNOSIS — Z1329 Encounter for screening for other suspected endocrine disorder: Secondary | ICD-10-CM | POA: Diagnosis not present

## 2017-08-31 DIAGNOSIS — E559 Vitamin D deficiency, unspecified: Secondary | ICD-10-CM | POA: Diagnosis not present

## 2018-08-19 ENCOUNTER — Ambulatory Visit: Payer: Self-pay | Admitting: *Deleted

## 2018-08-19 NOTE — Telephone Encounter (Signed)
  Patient called in with complaints of dizziness that she states started on yesterday morning that comes and goes. Pt states she does feel lightheaded and feels a little like the room is spinning or tilting. Pt denies being on any medications that could cause dizziness. Pt states that she did but a pulse oximeter and noted that her O2 ranged between 98-99% and her pulse was between 56-59. Pt advised to seek treatment at Urgent Care for current symptoms. Last visit with Cory,NP noted to be in 2017. Pt also notes that she was going to her OB/GYN to be evaluated for labs as well bu has not been for an appt in a year and a half. Pt verbalized understanding. Pt can be contacted at [phone]. Reason for Disposition . [1] MODERATE dizziness (e.g., interferes with normal activities) AND [2] has NOT been evaluated by physician for this  (Exception: dizziness caused by heat exposure, sudden standing, or poor fluid intake)  Answer Assessment - Initial Assessment Questions 1. DESCRIPTION: "Describe your dizziness."     Feels light headed and comes and goes 2. LIGHTHEADED: "Do you feel lightheaded?" (e.g., somewhat faint, woozy, weak upon standing)     Feels real lightheaded 3. VERTIGO: "Do you feel like either you or the room is spinning or tilting?" (i.e. vertigo)     A little bit 4. SEVERITY: "How bad is it?"  "Do you feel like you are going to faint?" "Can you stand and walk?"   - MILD - walking normally   - MODERATE - interferes with normal activities (e.g., work, school)    - SEVERE - unable to stand, requires support to walk, feels like passing out now.      mild 5. ONSET:  "When did the dizziness begin?"     Yesterday morning 6. AGGRAVATING FACTORS: "Does anything make it worse?" (e.g., standing, change in head position)     No, not really I just sit down when I start to get dizzy 7. HEART RATE: "Can you tell me your heart rate?" "How many beats in 15 seconds?"  (Note: not all patients can do this)        No does not know how to check 8. CAUSE: "What do you think is causing the dizziness?"     unknown 9. RECURRENT SYMPTOM: "Have you had dizziness before?" If so, ask: "When was the last time?" "What happened that time?"     A long time ago, years and years ago 59. OTHER SYMPTOMS: "Do you have any other symptoms?" (e.g., fever, chest pain, vomiting, diarrhea, bleeding)       No difficulty breathing, about 2 weeks ago I started to have back pain and on right hand shoulder but thought it was due to being heavy chested and due to bra. One day took bra strap off for the whole day and then it seemed like chest right on the inside of chest began to hurt.Pt states the middle and side of back has felt a lot better but on yesterday the right side of the breast began to hurt but not constant. Ibuprofen did help some  Protocols used: DIZZINESS [facility]

## 2018-08-22 NOTE — Telephone Encounter (Signed)
Left a message for a return call.  Will see if pt would like to set up virtual visit.

## 2018-08-23 ENCOUNTER — Telehealth: Payer: Self-pay | Admitting: Family Medicine

## 2018-08-23 NOTE — Telephone Encounter (Signed)
Copied from [place] [id number]. Topic: General - Other >> Aug 22, 2018  4:18 PM Oneta Rack wrote: Patient returning [facility] call and wanted PCP to be made aware she was seen at the urgent care on Friday EKG was done no findings or anything to be concerned with. Patient states she no longer feels dehydrated due to her drinking a lot of water and no c/o of chest pain. Patient states if symptoms worsen will call back and consider virtual visit

## 2018-08-23 NOTE — Telephone Encounter (Signed)
See incoming telephone call from 08/23/2018.

## 2018-09-06 ENCOUNTER — Encounter: Payer: Self-pay | Admitting: Family Medicine

## 2018-09-06 ENCOUNTER — Other Ambulatory Visit: Payer: Self-pay

## 2018-09-06 ENCOUNTER — Ambulatory Visit (INDEPENDENT_AMBULATORY_CARE_PROVIDER_SITE_OTHER): Payer: Managed Care, Other (non HMO) | Admitting: Family Medicine

## 2018-09-06 DIAGNOSIS — R079 Chest pain, unspecified: Secondary | ICD-10-CM | POA: Diagnosis not present

## 2018-09-06 DIAGNOSIS — R42 Dizziness and giddiness: Secondary | ICD-10-CM

## 2018-09-06 NOTE — Progress Notes (Signed)
Subjective:    Patient ID: Brooke Khan, female    DOB: March 02, 1966, 53 y.o.   MRN: [id number]  HPI Virtual Visit via Video Note  I connected with the patient on 09/06/18 at  3:15 PM EDT by a video enabled telemedicine application and verified that I am speaking with the correct person using two identifiers.  Location patient: home Location provider:work or home office Persons participating in the virtual visit: patient, provider  I discussed the limitations of evaluation and management by telemedicine and the availability of in person appointments. The patient expressed understanding and agreed to proceed.   HPI: Here to discuss some symptoms that first appeared 2 and 1/2 weeks ago. That day while walking on her treadmill at home she developed a mild pain in the right upper back along with some dizziness. She describes the dizziness as feeling like the ground is moving beneath her feet, but the room was not spinning around her. No headache or blurred vision, no SOB, no nausea, no cough or fever. As soon as she stopped walking the pain resolved. She was seen in urgent care shortly after that and her exam was normal. Her BP was elevated a bit however at 162/86. O2 sat was 100%. An EKG was normal except for some mild bradycardia. The etiology of her symptoms was not clear, so she was sent home. She felt fine until yesterday when she was again walking on her treadmill at home. This time she developed some mild right sided chest pain and some dizziness. The chest pain resolved promptly when she stopped walking. No SOB or cough or fever. No NVD. She again developed a little dizziness, and this dizziness persists today. No blurred vision or headaches. No bowel or bladder issues. She takes a few OTC vitamins every day, but she takes no medications.    ROS: See pertinent positives and negatives per HPI.  Past Medical History:  Diagnosis Date  . Chicken pox   . Heart murmur   .  Hyperlipidemia    borderline- no meds    Past Surgical History:  Procedure Laterality Date  . CESAREAN SECTION     x2  . cosmetic knee surgery     as child    Family History  Problem Relation Age of Onset  . Arthritis Maternal Grandfather   . Breast cancer Maternal Aunt   . Prostate cancer Father   . Elevated Lipids Father   . Heart disease Father   . Hypertension Father   . Diabetes Father   . Elevated Lipids Mother   . Diabetes Paternal Grandfather   . Diabetes Maternal Grandmother   . Colon cancer Neg Hx   . Colon polyps Neg Hx   . Esophageal cancer Neg Hx   . Rectal cancer Neg Hx   . Stomach cancer Neg Hx      Current Outpatient Medications:  .  Calcium Citrate-Vitamin D (CALCIUM + D PO), Take 1 capsule by mouth daily., Disp: , Rfl:  .  Cyanocobalamin (VITAMIN B 12 PO), Take 1 capsule by mouth daily., Disp: , Rfl:  .  VITAMIN D, CHOLECALCIFEROL, PO, Take by mouth., Disp: , Rfl:   EXAM:  VITALS per patient if applicable:  GENERAL: alert, oriented, appears well and in no acute distress  HEENT: atraumatic, conjunttiva clear, no obvious abnormalities on inspection of external nose and ears  NECK: normal movements of the head and neck  LUNGS: on inspection no signs of respiratory distress, breathing rate appears  normal, no obvious gross SOB, gasping or wheezing  CV: no obvious cyanosis  MS: moves all visible extremities without noticeable abnormality  PSYCH/NEURO: pleasant and cooperative, no obvious depression or anxiety, speech and thought processing grossly intact  ASSESSMENT AND PLAN: It is not clear what the etiology of her symptoms could be. She seems to have a bit of vertigo, which could be related to seasonal allergies. The chest pain could have been muscular I suppose. Another possibility is that her BP went up while she was exercising. We will have her come by the clinic in the morning for lab work including electrolytes and a D dimer. If this  persists, she will likely ned to come in for a live OV to allow a more thorough exam.  Alysia Penna, MD  Discussed the following assessment and plan:  Chest pain, unspecified type - Plan: Lipid panel, Basic metabolic panel, Hepatic function panel, TSH, POCT Urinalysis Dipstick (Automated), CBC with Differential/Platelet, D-dimer, Quantitative     I discussed the assessment and treatment plan with the patient. The patient was provided an opportunity to ask questions and all were answered. The patient agreed with the plan and demonstrated an understanding of the instructions.   The patient was advised to call back or seek an in-person evaluation if the symptoms worsen or if the condition fails to improve as anticipated.     Review of Systems     Objective:   Physical Exam        Assessment & Plan:

## 2018-09-07 ENCOUNTER — Other Ambulatory Visit: Payer: Managed Care, Other (non HMO)

## 2018-09-07 ENCOUNTER — Telehealth: Payer: Self-pay | Admitting: *Deleted

## 2018-09-07 DIAGNOSIS — E559 Vitamin D deficiency, unspecified: Secondary | ICD-10-CM

## 2018-09-07 LAB — POC URINALSYSI DIPSTICK (AUTOMATED)
Bilirubin, UA: NEGATIVE
Blood, UA: NEGATIVE
Glucose, UA: NEGATIVE
Ketones, UA: NEGATIVE
Leukocytes, UA: NEGATIVE
Nitrite, UA: NEGATIVE
Protein, UA: NEGATIVE
Spec Grav, UA: 1.01 (ref 1.010–1.025)
Urobilinogen, UA: 0.2 E.U./dL
pH, UA: 7 (ref 5.0–8.0)

## 2018-09-07 LAB — BASIC METABOLIC PANEL
BUN: 14 mg/dL (ref 6–23)
CO2: 29 mEq/L (ref 19–32)
Calcium: 9.8 mg/dL (ref 8.4–10.5)
Chloride: 105 mEq/L (ref 96–112)
Creatinine, Ser: 0.75 mg/dL (ref 0.40–1.20)
GFR: 80.2 mL/min (ref >60.00)
Glucose, Bld: 99 mg/dL (ref 70–99)
Potassium: 4.6 mEq/L (ref 3.5–5.1)
Sodium: 142 mEq/L (ref 135–145)

## 2018-09-07 LAB — TSH: TSH: 4.79 u[IU]/mL — ABNORMAL HIGH (ref 0.35–4.50)

## 2018-09-07 LAB — CBC WITH DIFFERENTIAL/PLATELET
Basophils Absolute: 0 10*3/uL (ref 0.0–0.1)
Basophils Relative: 0.8 % (ref 0.0–3.0)
Eosinophils Absolute: 0.2 10*3/uL (ref 0.0–0.7)
Eosinophils Relative: 3.4 % (ref 0.0–5.0)
HCT: 42.4 % (ref 36.0–46.0)
Hemoglobin: 14.2 g/dL (ref 12.0–15.0)
Lymphocytes Relative: 37.9 % (ref 12.0–46.0)
Lymphs Abs: 2.3 10*3/uL (ref 0.7–4.0)
MCHC: 33.5 g/dL (ref 30.0–36.0)
MCV: 91.4 fl (ref 78.0–100.0)
Monocytes Absolute: 0.3 10*3/uL (ref 0.1–1.0)
Monocytes Relative: 5.7 % (ref 3.0–12.0)
Neutro Abs: 3.2 10*3/uL (ref 1.4–7.7)
Neutrophils Relative %: 52.2 % (ref 43.0–77.0)
Platelets: 295 10*3/uL (ref 150.0–400.0)
RBC: 4.64 Mil/uL (ref 3.87–5.11)
RDW: 13.6 % (ref 11.5–15.5)
WBC: 6 10*3/uL (ref 4.0–10.5)

## 2018-09-07 LAB — LIPID PANEL
Cholesterol: 195 mg/dL (ref 0–200)
HDL: 41.1 mg/dL (ref >39.00)
LDL Cholesterol: 129 mg/dL — ABNORMAL HIGH (ref 0–99)
NonHDL: 153.47
Total CHOL/HDL Ratio: 5
Triglycerides: 121 mg/dL (ref 0.0–149.0)
VLDL: 24.2 mg/dL (ref 0.0–40.0)

## 2018-09-07 LAB — HEPATIC FUNCTION PANEL
ALT: 33 U/L (ref 0–35)
AST: 20 U/L (ref 0–37)
Albumin: 4.5 g/dL (ref 3.5–5.2)
Alkaline Phosphatase: 67 U/L (ref 39–117)
Bilirubin, Direct: 0.1 mg/dL (ref 0.0–0.3)
Total Bilirubin: 0.4 mg/dL (ref 0.2–1.2)
Total Protein: 7.4 g/dL (ref 6.0–8.3)

## 2018-09-07 NOTE — Addendum Note (Signed)
Addended by: Alysia Penna A on: 09/07/2018 10:37 AM   Modules accepted: Orders

## 2018-09-07 NOTE — Telephone Encounter (Signed)
Dr. Sarajane Jews please advise on adding the vitamin d to her labs today.  Thanks

## 2018-09-07 NOTE — Telephone Encounter (Signed)
Copied from [place] [phone]. Topic: Appointment Scheduling - Scheduling Inquiry for Clinic >> Sep 07, 2018  8:35 AM Brooke Khan wrote: Reason for CRM: pt saw dr fry yesterday and would like vit d to be added to her labs. Pt vit d was low last year. Pt would like this to be coded so that her insurance will cover the charges ? Maybe preventative code. Pt would to have labs drawn today. Pt is fasting

## 2018-09-07 NOTE — Telephone Encounter (Signed)
This order was added

## 2018-09-07 NOTE — Addendum Note (Signed)
Addended by: Elmer Picker on: 09/07/2018 10:25 AM   Modules accepted: Orders

## 2018-09-08 ENCOUNTER — Other Ambulatory Visit: Payer: Self-pay | Admitting: Family Medicine

## 2018-09-08 ENCOUNTER — Encounter: Payer: Self-pay | Admitting: *Deleted

## 2018-09-08 LAB — D-DIMER, QUANTITATIVE (NOT AT ARMC): D-Dimer, Quant: 0.3 mcg/mL FEU (ref <0.50)

## 2018-09-08 MED ORDER — LEVOTHYROXINE SODIUM 50 MCG PO TABS: 50.0000 ug | ORAL_TABLET | Freq: Every day | ORAL | 3 refills | Status: DC

## 2018-09-08 NOTE — Telephone Encounter (Signed)
New rx sent to the pharmacy

## 2018-09-12 NOTE — Telephone Encounter (Signed)
"   Good fats" like omega 3 fatty acids (almonds, salmon, avocados) are fine. Just fatty meats like bacon and sausage, gravies, etc.

## 2018-09-17 ENCOUNTER — Encounter: Payer: Self-pay | Admitting: Family Medicine

## 2018-10-04 ENCOUNTER — Ambulatory Visit: Payer: Managed Care, Other (non HMO) | Admitting: Family Medicine

## 2018-10-04 ENCOUNTER — Ambulatory Visit (INDEPENDENT_AMBULATORY_CARE_PROVIDER_SITE_OTHER): Payer: BC Managed Care – PPO | Admitting: Family Medicine

## 2018-10-04 ENCOUNTER — Encounter: Payer: Self-pay | Admitting: Family Medicine

## 2018-10-04 VITALS — Ht 62.0 in | Wt 170.0 lb

## 2018-10-04 DIAGNOSIS — E785 Hyperlipidemia, unspecified: Secondary | ICD-10-CM | POA: Diagnosis not present

## 2018-10-04 DIAGNOSIS — R42 Dizziness and giddiness: Secondary | ICD-10-CM

## 2018-10-04 DIAGNOSIS — R079 Chest pain, unspecified: Secondary | ICD-10-CM

## 2018-10-04 DIAGNOSIS — M79602 Pain in left arm: Secondary | ICD-10-CM

## 2018-10-04 DIAGNOSIS — E559 Vitamin D deficiency, unspecified: Secondary | ICD-10-CM

## 2018-10-04 DIAGNOSIS — E039 Hypothyroidism, unspecified: Secondary | ICD-10-CM | POA: Diagnosis not present

## 2018-10-04 NOTE — Assessment & Plan Note (Signed)
S: On thyroid medication-Patient was doing weight watchers and lost 4 lbs in 2 months. Had tsh checked and it was slightly high- was started on levothyroxine 50 mcg.  Lab Results  Component Value Date   TSH 4.79 (H) 09/07/2018   A/P: too soon for tsh repeat- she has visit mid June with GYN Dr. Julien Girt and will try to get this drawn at that time

## 2018-10-04 NOTE — Assessment & Plan Note (Signed)
S: currently on 2000 units daily A/P: agrees to get vitamin D checked at [place]- if this cant get ordered will let me know and we can update

## 2018-10-04 NOTE — Patient Instructions (Addendum)
Health Maintenance Due  Topic Date Due  . HIV Screening  08/09/1978  . TETANUS/TDAP  01/19/2016  Will discuss with during visit  Depression screen [facility] 2/9 10/04/2018 01/16/2015  Decreased Interest 0 0  Down, Depressed, Hopeless 0 0  PHQ - 2 Score 0 0   Video visit

## 2018-10-04 NOTE — Progress Notes (Signed)
Phone [phone]   Subjective:  Virtual visit via Video note. Chief complaint: Chief Complaint  Patient presents with  . Establish Care  Patient presents today to establish care.  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   This visit type was conducted due to national recommendations for restrictions regarding the COVID-19 Pandemic (e.g. social distancing).  This format is felt to be most appropriate for this patient at this time balancing risks to patient and risks to population by having him in for in person visit.  No physical exam was performed (except for noted visual exam or audio findings with Telehealth visits).    Our team/I connected with Lysle Morales at  1:40 PM EDT by a video enabled telemedicine application ([email or web address] or caregility through epic) and verified that I am speaking with the correct person using two identifiers.  Location patient: Home[place] Location provider: [facility], office Persons participating in the virtual visit:  patient  Our team/I discussed the limitations of evaluation and management by telemedicine and the availability of in person appointments. In light of current covid-19 pandemic, patient also understands that we are trying to protect them by minimizing in office contact if at all possible.  The patient expressed consent for telemedicine visit and agreed to proceed. Patient understands insurance will be billed.   ROS- no shortness of breath. No neck pain. No abnormal fatigue. No fever or chills reported.    See problem oriented charting  The following were reviewed and entered/updated in epic: Past Medical History:  Diagnosis Date  . Chicken pox   . Heart murmur    since childhood- not always heard  . Hyperlipidemia    borderline- no meds   Patient Active Problem List   Diagnosis Date Noted  . Hypothyroidism, unspecified 10/04/2018  . Vitamin D deficiency 10/04/2018   Past Surgical History:  Procedure Laterality Date   . CESAREAN SECTION     x2  . cosmetic knee surgery     as child    Family History  Problem Relation Age of Onset  . Arthritis Maternal Grandfather   . Breast cancer Maternal Aunt   . Prostate cancer Father   . Elevated Lipids Father   . Heart disease Father        mid 31s. had been remote smoker.   . Hypertension Father   . Diabetes Father        mid 59s  . Elevated Lipids Mother   . Diabetes Paternal Grandfather   . Diabetes Maternal Grandmother   . Healthy Brother   . Colon cancer Neg Hx   . Colon polyps Neg Hx   . Esophageal cancer Neg Hx   . Rectal cancer Neg Hx   . Stomach cancer Neg Hx     Medications- reviewed and updated Current Outpatient Medications  Medication Sig Dispense Refill  . Calcium Citrate-Vitamin D (CALCIUM + D PO) Take 1 capsule by mouth daily.    . Cyanocobalamin (VITAMIN B 12 PO) Take 1 capsule by mouth daily.    Marland Kitchen levothyroxine (SYNTHROID) 50 MCG tablet Take 1 tablet (50 mcg total) by mouth daily. 90 tablet 3  . Omega-3 Fatty Acids (FISH OIL) 1000 MG CAPS Take by mouth.    Marland Kitchen VITAMIN D, CHOLECALCIFEROL, PO Take by mouth.     No current facility-administered medications for this visit.     Allergies-reviewed and updated Allergies  Allergen Reactions  . Codeine Nausea And Vomiting    Social History   Social  History Narrative   Married 30 years October 2020. Daughter went to [place]. Son went to [place]. In 2020, daughter Corporate treasurer in [place], son moving to [place] and to work for [employer] in Engineer, mining. 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       Hobbies: tennis, plays mahjong in womens group      ROS- see above  Objective  Objective:  Ht 5\' 2"  (1.575 m)   Wt 170 lb (77.1 kg)   LMP 05/11/2014   BMI 31.09 kg/m  Gen: NAD, resting comfortably Lungs: nonlabored, normal respiratory rate  Skin: appears dry, no obvious rash   Assessment and Plan:   # prior right sided chest pain. Intermittent left arm  pain.  S:last 2 weeks has had some pain in her left arm- woke her up the first night and kept her up for 2-3 hours. Then since then has had a low level pain. Located in pinpoint level in the arm just to the left of the axilla level. Feels little sharp pains that go away at this point. Also has had similar sensation in right arm recently in same place but not as often and not as severe. Does not hurt when she pushes on it- just comes and goes perhaps 2x a day  No chest pain, shortness of rbeath. It does not correspond to her dizziness.  When saw Dr. Sarajane Jews had some right had some right sided chest pain but that has resolved- that was when she was on treadmill and she has avoided the treadmill  She was seen at urgent care 2 weeks prior to seeing Dr. Sarajane Jews 09/06/2018 A/P: prior right sided chest pain has resolved- was not exertional- occurred at night- possible GERD? Will get records from urgent care as well as EKG- she will contact tem and forward to [place]. In regards to the left arm pain- intermittent sharp pains- no other symptoms along with this and do not strongly suspect cardiac- also occasionally happens on right- perhaps msk strain? Regardless she will monitor closely for new or worsening symptoms and let me know if this occurs.    # DIzziness S:perhaps 2 days a week would feel mildly lightheaded- could still drive and didn't disorient her.  Admits doesn't do the best job staying well hydrated. Started a month ago or so.  A/P: dizziness could easily be dehydration related- encouraged her to push hydration and let us know if recurs. Episodes have not occurred along with right chest pain or left arm pain   #hyperlipidemia S: poorly controlled on no statin. 10 year ascvd risk at 4% Lab Results  Component Value Date   CHOL 195 09/07/2018   HDL 41.10 09/07/2018   LDLCALC 129 (H) 09/07/2018   LDLDIRECT 118.0 03/14/2015   TRIG 121.0 09/07/2018   CHOLHDL 5 09/07/2018   A/P:  Has family history of CAD in  mid 2s as well as untreated hyperlipidemia but very mild- I do not feel strongly about stating atstatin at this point- focus on healthy eating and regular exercise and reassess at least yearly and update ascvd risk  #hypothyroidism S: On thyroid medication-Patient was doing weight watchers and lost 4 lbs in 2 months. Had tsh checked and it was slightly high- was started on levothyroxine 50 mcg.  Lab Results  Component Value Date   TSH 4.79 (H) 09/07/2018   A/P: too soon for tsh repeat- she has visit mid June with GYN Dr. Julien Girt and will try to  get this drawn at that time  Other notes: 1.needs Tdap- she wants to hold off for now.  2. Had zostavax years ago. Wants to consider shingrix later date   Hypothyroidism, unspecified S: On thyroid medication-Patient was doing weight watchers and lost 4 lbs in 2 months. Had tsh checked and it was slightly high- was started on levothyroxine 50 mcg.  Lab Results  Component Value Date   TSH 4.79 (H) 09/07/2018   A/P: too soon for tsh repeat- she has visit mid June with GYN Dr. Julien Girt and will try to get this drawn at that time   Vitamin D deficiency S: currently on 2000 units daily A/P: agrees to get vitamin D checked at [facility]- if this cant get ordered will let me know and we can update   Advised physical at least yearly- did not do as cpe today but would be reasonable to do in 1 year- sooner if needed for new or worsening symptoms such as left arm pain or dizzinss Lab/Order associations: Hypothyroidism, unspecified type  Hyperlipidemia, unspecified hyperlipidemia type  Vitamin D deficiency  Right-sided chest pain  Left arm pain  Dizziness  Return precautions advised. Garret Reddish, MD

## 2018-10-19 DIAGNOSIS — E039 Hypothyroidism, unspecified: Secondary | ICD-10-CM | POA: Diagnosis not present

## 2018-10-19 DIAGNOSIS — Z1321 Encounter for screening for nutritional disorder: Secondary | ICD-10-CM | POA: Diagnosis not present

## 2018-10-19 DIAGNOSIS — R7303 Prediabetes: Secondary | ICD-10-CM | POA: Diagnosis not present

## 2018-11-02 DIAGNOSIS — Z1231 Encounter for screening mammogram for malignant neoplasm of breast: Secondary | ICD-10-CM | POA: Diagnosis not present

## 2018-11-02 DIAGNOSIS — Z6831 Body mass index (BMI) 31.0-31.9, adult: Secondary | ICD-10-CM | POA: Diagnosis not present

## 2018-11-02 DIAGNOSIS — Z01419 Encounter for gynecological examination (general) (routine) without abnormal findings: Secondary | ICD-10-CM | POA: Diagnosis not present

## 2018-11-02 LAB — HM MAMMOGRAPHY

## 2018-11-29 DIAGNOSIS — L570 Actinic keratosis: Secondary | ICD-10-CM | POA: Diagnosis not present

## 2018-11-29 DIAGNOSIS — D485 Neoplasm of uncertain behavior of skin: Secondary | ICD-10-CM | POA: Diagnosis not present

## 2019-01-05 ENCOUNTER — Encounter: Payer: Self-pay | Admitting: Family Medicine

## 2019-01-11 ENCOUNTER — Other Ambulatory Visit: Payer: Self-pay

## 2019-01-11 DIAGNOSIS — R7989 Other specified abnormal findings of blood chemistry: Secondary | ICD-10-CM

## 2019-01-12 NOTE — Telephone Encounter (Signed)
Was pt Bp checked. Please advise

## 2019-01-12 NOTE — Telephone Encounter (Signed)
Lab visit is scheduled for 9/4. BP can be checked when she comes in for labs tomorrow. Looks like it has been noted on appt info for tomorrow.

## 2019-01-12 NOTE — Telephone Encounter (Signed)
Noted. Thanks.

## 2019-01-13 ENCOUNTER — Other Ambulatory Visit: Payer: Self-pay

## 2019-01-13 ENCOUNTER — Other Ambulatory Visit (INDEPENDENT_AMBULATORY_CARE_PROVIDER_SITE_OTHER): Payer: BC Managed Care – PPO

## 2019-01-13 DIAGNOSIS — R7989 Other specified abnormal findings of blood chemistry: Secondary | ICD-10-CM

## 2019-01-13 DIAGNOSIS — E559 Vitamin D deficiency, unspecified: Secondary | ICD-10-CM

## 2019-01-13 LAB — VITAMIN D 25 HYDROXY (VIT D DEFICIENCY, FRACTURES): VITD: 22.76 ng/mL — ABNORMAL LOW (ref 30.00–100.00)

## 2019-01-13 LAB — TSH: TSH: 2.27 u[IU]/mL (ref 0.35–4.50)

## 2019-01-13 NOTE — Telephone Encounter (Signed)
Dr. Yong Channel, BP today was 142/82. Pt denies HA, dizziness, CP, SOB, visual changes. She is aware that you will be out of the office until Tuesday.

## 2019-01-13 NOTE — Progress Notes (Signed)
Pt denies HA, dizziness, CP, SOB, visual changes.

## 2019-01-19 ENCOUNTER — Ambulatory Visit: Payer: BC Managed Care – PPO

## 2019-01-19 ENCOUNTER — Encounter: Payer: Self-pay | Admitting: Family Medicine

## 2019-01-29 ENCOUNTER — Encounter: Payer: Self-pay | Admitting: Family Medicine

## 2019-01-30 ENCOUNTER — Ambulatory Visit: Payer: Self-pay | Admitting: *Deleted

## 2019-01-30 NOTE — Telephone Encounter (Signed)
   Reason for Disposition . Systolic BP  >= 0000000 OR Diastolic >= 123XX123  Answer Assessment - Initial Assessment Questions 1. BLOOD PRESSURE: "What is the blood pressure?" "Did you take at least two measurements 5 minutes apart?"     147/84 and 157/87 5 hours apart   2. ONSET: "When did you take your blood pressure?"     First elevated reading April 2020 3. HOW: "How did you obtain the blood pressure?" (e.g., visiting nurse, automatic home BP monitor)     Automatic home BP monitor recommended by Dr. Yong Channel  4. HISTORY: "Do you have a history of high blood pressure?"     No previous history of elevated blood pressures 5. MEDICATIONS: "Are you taking any medications for blood pressure?" "Have you missed any doses recently?"     Not on blood pressure medications 6. OTHER SYMPTOMS: "Do you have any symptoms?" (e.g., headache, chest pain, blurred vision, difficulty breathing, weakness)     Headache- rated 3/10 , no other symptoms 7. PREGNANCY: "Is there any chance you are pregnant?" "When was your last menstrual period?"     N/A  Protocols used: HIGH BLOOD PRESSURE-A-AH  Patient states she has been keeping a check on her blood pressure for the past week per Dr. Yong Channel.  She sent her readings to him via Mychart 01/29/2019.  Her most elevated readings has been 160s/80s.  Today her readings were 147/84 and 157/87 about 5 hours apart.  She states she has been having a "dull headache" as well, she rates 3/10 today.  Attempted to get patient scheduled for an appointment with Dr. Yong Channel in the next 1-2 days.  His schedule is full, and flow coordinator requested that I send note over, and Dr. Ansel Bong nurse will check with him and call patient with appointment.  Patient notified that she will receive a call back from PCP office regarding appointment.  Advised patient in the meantime if she experiences any chest pain, shortness of breath, severe headache, or vision changes, she should seek treatment at ER.   Patient expressed understanding and is agreeable with plan.

## 2019-01-30 NOTE — Telephone Encounter (Signed)
I am not sure why there is a "block" on the 220 slot-I am not sure what this is referencing.  Lets see her at 220 tomorrow.  If that does not work for her-can use other same days this week.

## 2019-01-30 NOTE — Telephone Encounter (Signed)
Dr. Yong Channel,  Please see pt BP readings and triage note below and advise if pt needs to be worked in or if visit can wait until first available appointment.   "Hi Dr. Yong Channel,   I've been keeping track of my blood pressure this last week. It does seem higher than it has been. It is as follows  9/13 - 139/89. 59  9/14 - 142/93. 66  9/15 - 133/89. 61  9/16 - 142/91. 67  9/17 - 134/74. 61  9/18 - 141/76. 57  (first thing in morning it was really high...160 over something, a couple of hours later it was 142/76  9/19 - 138/80. 60  9/20 - 156/81. 45. in am.  142/86. 60. pm.   at night.Marland KitchenMarland KitchenI checked and it was 153/80 on one arm and 145/83  Seems like it is a little all over the place.   Not exactly sure what all this means. What is your recommendation? Should I come into the office tomorrow? "

## 2019-01-30 NOTE — Telephone Encounter (Signed)
Please advise on scheduling as BP numbers are not elevated. I asked Dr. Jonni Sanger and she asked "does the patient have HTN already? being treated? regardless, can be reassured if not having symptoms and make an appointment with Dr. Yong Channel within the next several weeks. "  Please assist

## 2019-01-31 ENCOUNTER — Encounter: Payer: Self-pay | Admitting: Family Medicine

## 2019-01-31 NOTE — Telephone Encounter (Signed)
Please contact pt to see if she can come in at 2:20 today. Thanks!

## 2019-01-31 NOTE — Telephone Encounter (Signed)
Noted, thanks!

## 2019-01-31 NOTE — Telephone Encounter (Signed)
Patient was unable to come in at 2:20 today. I scheduled her into the 10am for tomorrow so she does not have to wait until the next available and wanted to see Dr. Yong Channel.

## 2019-02-01 ENCOUNTER — Encounter: Payer: Self-pay | Admitting: Family Medicine

## 2019-02-01 ENCOUNTER — Other Ambulatory Visit: Payer: Self-pay

## 2019-02-01 ENCOUNTER — Ambulatory Visit (INDEPENDENT_AMBULATORY_CARE_PROVIDER_SITE_OTHER): Payer: BC Managed Care – PPO | Admitting: Family Medicine

## 2019-02-01 VITALS — BP 130/74 | HR 66 | Temp 98.0°F | Ht 62.0 in | Wt 180.0 lb

## 2019-02-01 DIAGNOSIS — E559 Vitamin D deficiency, unspecified: Secondary | ICD-10-CM

## 2019-02-01 DIAGNOSIS — E039 Hypothyroidism, unspecified: Secondary | ICD-10-CM

## 2019-02-01 DIAGNOSIS — R030 Elevated blood-pressure reading, without diagnosis of hypertension: Secondary | ICD-10-CM

## 2019-02-01 DIAGNOSIS — E669 Obesity, unspecified: Secondary | ICD-10-CM | POA: Diagnosis not present

## 2019-02-01 DIAGNOSIS — Z23 Encounter for immunization: Secondary | ICD-10-CM

## 2019-02-01 DIAGNOSIS — I10 Essential (primary) hypertension: Secondary | ICD-10-CM | POA: Insufficient documentation

## 2019-02-01 NOTE — Patient Instructions (Addendum)
Health Maintenance Due  Topic Date Due  . TETANUS/TDAP -today 01/19/2016  . PAP SMEAR-not due until 10/2019- Sign release of information at the check out desk for last pap smear Dr. Julien Girt  10/09/2016  . INFLUENZA VACCINE -today 12/10/2018   I like the new blood pressures on beet root juice! Lets continue that and weight loss efforts, plant based diet efforts, exercise efforts targetting 150 mins a week. Consider myfitness pal if struggling with weight loss efforts.   DASH Eating Plan DASH stands for "Dietary Approaches to Stop Hypertension." The DASH eating plan is a healthy eating plan that has been shown to reduce high blood pressure (hypertension). It may also reduce your risk for type 2 diabetes, heart disease, and stroke. The DASH eating plan may also help with weight loss. What are tips for following this plan?  General guidelines  Avoid eating more than 2,300 mg (milligrams) of salt (sodium) a day. If you have hypertension, you may need to reduce your sodium intake to 1,500 mg a day.  Limit alcohol intake to no more than 1 drink a day for nonpregnant women and 2 drinks a day for men. One drink equals 12 oz of beer, 5 oz of wine, or 1 oz of hard liquor.  Work with your health care provider to maintain a healthy body weight or to lose weight. Ask what an ideal weight is for you.  Get at least 30 minutes of exercise that causes your heart to beat faster (aerobic exercise) most days of the week. Activities may include walking, swimming, or biking.  Work with your health care provider or diet and nutrition specialist (dietitian) to adjust your eating plan to your individual calorie needs. Reading food labels   Check food labels for the amount of sodium per serving. Choose foods with less than 5 percent of the Daily Value of sodium. Generally, foods with less than 300 mg of sodium per serving fit into this eating plan.  To find whole grains, look for the word "whole" as the first word  in the ingredient list. Shopping  Buy products labeled as "low-sodium" or "no salt added."  Buy fresh foods. Avoid canned foods and premade or frozen meals. Cooking  Avoid adding salt when cooking. Use salt-free seasonings or herbs instead of table salt or sea salt. Check with your health care provider or pharmacist before using salt substitutes.  Do not fry foods. Cook foods using healthy methods such as baking, boiling, grilling, and broiling instead.  Cook with heart-healthy oils, such as olive, canola, soybean, or sunflower oil. Meal planning  Eat a balanced diet that includes: ? 5 or more servings of fruits and vegetables each day. At each meal, try to fill half of your plate with fruits and vegetables. ? Up to 6-8 servings of whole grains each day. ? Less than 6 oz of lean meat, poultry, or fish each day. A 3-oz serving of meat is about the same size as a deck of cards. One egg equals 1 oz. ? 2 servings of low-fat dairy each day. ? A serving of nuts, seeds, or beans 5 times each week. ? Heart-healthy fats. Healthy fats called Omega-3 fatty acids are found in foods such as flaxseeds and coldwater fish, like sardines, salmon, and mackerel.  Limit how much you eat of the following: ? Canned or prepackaged foods. ? Food that is high in trans fat, such as fried foods. ? Food that is high in saturated fat, such as fatty meat. ?  Sweets, desserts, sugary drinks, and other foods with added sugar. ? Full-fat dairy products.  Do not salt foods before eating.  Try to eat at least 2 vegetarian meals each week.  Eat more home-cooked food and less restaurant, buffet, and fast food.  When eating at a restaurant, ask that your food be prepared with less salt or no salt, if possible. What foods are recommended? The items listed may not be a complete list. Talk with your dietitian about what dietary choices are best for you. Grains Whole-grain or whole-wheat bread. Whole-grain or  whole-wheat pasta. Brown rice. [place]. Bulgur. Whole-grain and low-sodium cereals. Pita bread. Low-fat, low-sodium crackers. Whole-wheat flour tortillas. Vegetables Fresh or frozen vegetables (raw, steamed, roasted, or grilled). Low-sodium or reduced-sodium tomato and vegetable juice. Low-sodium or reduced-sodium tomato sauce and tomato paste. Low-sodium or reduced-sodium canned vegetables. Fruits All fresh, dried, or frozen fruit. Canned fruit in natural juice (without added sugar). Meat and other protein foods Skinless chicken or [place]. Ground chicken or [place]. Pork with fat trimmed off. Fish and seafood. Egg whites. Dried beans, peas, or lentils. Unsalted nuts, nut butters, and seeds. Unsalted canned beans. Lean cuts of beef with fat trimmed off. Low-sodium, lean deli meat. Dairy Low-fat (1%) or fat-free (skim) milk. Fat-free, low-fat, or reduced-fat cheeses. Nonfat, low-sodium ricotta or cottage cheese. Low-fat or nonfat yogurt. Low-fat, low-sodium cheese. Fats and oils Soft margarine without trans fats. Vegetable oil. Low-fat, reduced-fat, or light mayonnaise and salad dressings (reduced-sodium). Canola, safflower, olive, soybean, and sunflower oils. Avocado. Seasoning and other foods Herbs. Spices. Seasoning mixes without salt. Unsalted popcorn and pretzels. Fat-free sweets. What foods are not recommended? The items listed may not be a complete list. Talk with your dietitian about what dietary choices are best for you. Grains Baked goods made with fat, such as croissants, muffins, or some breads. Dry pasta or rice meal packs. Vegetables Creamed or fried vegetables. Vegetables in a cheese sauce. Regular canned vegetables (not low-sodium or reduced-sodium). Regular canned tomato sauce and paste (not low-sodium or reduced-sodium). Regular tomato and vegetable juice (not low-sodium or reduced-sodium). Angie Fava. Olives. Fruits Canned fruit in a light or heavy syrup. Fried fruit. Fruit  in cream or butter sauce. Meat and other protein foods Fatty cuts of meat. Ribs. Fried meat. Berniece Salines. Sausage. Bologna and other processed lunch meats. Salami. Fatback. Hotdogs. Bratwurst. Salted nuts and seeds. Canned beans with added salt. Canned or smoked fish. Whole eggs or egg yolks. Chicken or [place] with skin. Dairy Whole or 2% milk, cream, and half-and-half. Whole or full-fat cream cheese. Whole-fat or sweetened yogurt. Full-fat cheese. Nondairy creamers. Whipped toppings. Processed cheese and cheese spreads. Fats and oils Butter. Stick margarine. Lard. Shortening. Ghee. Bacon fat. Tropical oils, such as coconut, palm kernel, or palm oil. Seasoning and other foods Salted popcorn and pretzels. Onion salt, garlic salt, seasoned salt, table salt, and sea salt. Worcestershire sauce. Tartar sauce. Barbecue sauce. Teriyaki sauce. Soy sauce, including reduced-sodium. Steak sauce. Canned and packaged gravies. Fish sauce. Oyster sauce. Cocktail sauce. Horseradish that you find on the shelf. Ketchup. Mustard. Meat flavorings and tenderizers. Bouillon cubes. Hot sauce and Tabasco sauce. Premade or packaged marinades. Premade or packaged taco seasonings. Relishes. Regular salad dressings. Where to find more information:  National Heart, Lung, and [place]: https://wilson-eaton.com/  American Heart Association: www.heart.org Summary  The DASH eating plan is a healthy eating plan that has been shown to reduce high blood pressure (hypertension). It may also reduce your risk for type 2 diabetes,  heart disease, and stroke.  With the DASH eating plan, you should limit salt (sodium) intake to 2,300 mg a day. If you have hypertension, you may need to reduce your sodium intake to 1,500 mg a day.  When on the DASH eating plan, aim to eat more fresh fruits and vegetables, whole grains, lean proteins, low-fat dairy, and heart-healthy fats.  Work with your health care provider or diet and nutrition specialist  (dietitian) to adjust your eating plan to your individual calorie needs. This information is not intended to replace advice given to you by your health care provider. Make sure you discuss any questions you have with your health care provider. Document Released: 04/16/2011 Document Revised: 04/09/2017 Document Reviewed: 04/20/2016 Elsevier Patient Education  2020 [employer].

## 2019-02-01 NOTE — Progress Notes (Signed)
Phone [phone]   Subjective:  Brooke Khan is a 53 y.o. year old very pleasant female patient who presents for/with See problem oriented charting Chief Complaint  Patient presents with  . Follow-up    BP readings  . Hypertension   ROS- Pt denies HA, blurred vision or chest pain. She does mention a dull HA last week but nothing severe that has resolved.  No dizziness reported  Past Medical History-  Patient Active Problem List   Diagnosis Date Noted  . Elevated blood pressure reading 02/01/2019  . Hypothyroidism, unspecified 10/04/2018  . Vitamin D deficiency 10/04/2018    Medications- reviewed and updated Current Outpatient Medications  Medication Sig Dispense Refill  . Calcium Citrate-Vitamin D (CALCIUM + D PO) Take 1 capsule by mouth daily.    . Cyanocobalamin (VITAMIN B 12 PO) Take 1 capsule by mouth daily.    Marland Kitchen levothyroxine (SYNTHROID) 50 MCG tablet Take 1 tablet (50 mcg total) by mouth daily. 90 tablet 3  . Omega-3 Fatty Acids (FISH OIL) 1000 MG CAPS Take by mouth.    Marland Kitchen VITAMIN D, CHOLECALCIFEROL, PO Take by mouth.     No current facility-administered medications for this visit.      Objective:  BP 130/74   Pulse 66   Temp 98 F (36.7 C)   Ht 5\' 2"  (1.575 m)   Wt 180 lb (81.6 kg)   LMP 05/11/2014   SpO2 98%   BMI 32.92 kg/m  Gen: NAD, resting comfortably CV: RRR no murmurs rubs or gallops Lungs: CTAB no crackles, wheeze, rhonchi Abdomen: soft/nontender/nondistended Ext: no edema Skin: warm, dry    Assessment and Plan   #Elevated blood pressure reading without diagnosis of hypertension #Obesity S: Blood pressure currently controlled on diet and exercise, pt states her BP has been running low last night and this morning 132/80 last night and this morning 128/81.   Did some reading online - Pt has been drinking beet root juice since Monday and unsalted pistachios. Home blood pressures trending back into 120s and 130s with these changes.  Prior  blood pressures intermittently above 140 before making this change-see most recent MyChart message.   Pt states diet and exercise are not going well in general recently.  Her weight is up 10 pounds since May  BP Readings from Last 3 Encounters:  02/01/19 130/74  01/13/19 (!) 142/82  12/24/15 132/64    A/P: Blood pressure is well controlled today-do not have enough elevated in office readings to diagnose this as hypertension and patient has responded well to dietary modifications including feet root juice -Counseling was provided today about ways to continue to control blood pressure including low processed foods in diet, DASHeating plan, diet rich in fruits and vegetables, regular exercise.  Did review some recent literature on the reduce and does appear to be a reasonable option.  Working on healthy lifestyle changes should help continue to control blood pressure and control/improve upon obesity  #Vitamin D deficiency S: Patient's last numbers in the 20s for nanograms per milliliter.  She was on 2000 units a day vitamin D-has increased to 4000 units/day A/P: Too soon to update vitamin D-we will plan on checking this next visit.  I think 4000 units/day is reasonable.  #hypothyroidism S: On thyroid medication-well-controlled on levothyroxine 50 mcg Lab Results  Component Value Date   TSH 2.27 01/13/2019   A/P: Stable. Continue current medications.    Also see letter that was written to patient on my chart  Recommended follow up: As needed-currently blood pressure is well controlled and can do 6 to 34-month follow-up  Lab/Order associations:   ICD-10-CM   1. Elevated blood pressure reading  R03.0   2. Obesity (BMI 30-39.9)  E66.9   3. Vitamin D deficiency  E55.9   4. Hypothyroidism, unspecified type  E03.9   5. Need for influenza vaccination  Z23 Flu Vaccine QUAD 6+ mos PF IM (Fluarix Quad PF)  6. Need for Tdap vaccination  Z23 Tdap vaccine greater than or equal to 7yo IM   Return  precautions advised.  Garret Reddish, MD

## 2019-02-13 ENCOUNTER — Other Ambulatory Visit: Payer: Self-pay | Admitting: Family Medicine

## 2019-08-04 ENCOUNTER — Ambulatory Visit: Payer: Self-pay | Attending: Internal Medicine

## 2019-08-04 DIAGNOSIS — Z23 Encounter for immunization: Secondary | ICD-10-CM

## 2019-08-04 NOTE — Progress Notes (Signed)
   Covid-19 Vaccination Clinic  Name:  Brooke Khan    MRN: [id number] DOB: July 31, 1965  08/04/2019  [name] was observed post Covid-19 immunization for 15 minutes without incident. She was provided with Vaccine Information Sheet and instruction to access the V-Safe system.   [name] was instructed to call 911 with any severe reactions post vaccine: Marland Kitchen Difficulty breathing  . Swelling of face and throat  . A fast heartbeat  . A bad rash all over body  . Dizziness and weakness   Immunizations Administered    Name Date Dose VIS Date Route   Pfizer COVID-19 Vaccine 08/04/2019 10:29 AM 0.3 mL 04/21/2019 Intramuscular   Manufacturer: [place]   Lot: [id number]   [place]: [id number]

## 2019-10-10 ENCOUNTER — Telehealth: Payer: Self-pay | Admitting: Family Medicine

## 2019-10-10 NOTE — Telephone Encounter (Signed)
If she is having concerns and wants [place] to update blood work-we can certainly do that before her physical but unless she has [place] healthcare we will have to wait on her normal physical until September

## 2019-10-10 NOTE — Telephone Encounter (Signed)
Patient is calling in wanting to get scheduled for an appointment so she can do fasting blood work, scheduled for a physical in September but patient would like to know if she can just come in sooner.

## 2019-10-10 NOTE — Telephone Encounter (Signed)
Please advise labs should be with visit right?

## 2019-10-11 NOTE — Telephone Encounter (Signed)
Per DPR, left a detailed message informing patient of PCP recommendations.

## 2019-12-12 DIAGNOSIS — Z1231 Encounter for screening mammogram for malignant neoplasm of breast: Secondary | ICD-10-CM | POA: Diagnosis not present

## 2019-12-22 DIAGNOSIS — Z1321 Encounter for screening for nutritional disorder: Secondary | ICD-10-CM | POA: Diagnosis not present

## 2019-12-22 DIAGNOSIS — Z13220 Encounter for screening for lipoid disorders: Secondary | ICD-10-CM | POA: Diagnosis not present

## 2019-12-22 DIAGNOSIS — Z1329 Encounter for screening for other suspected endocrine disorder: Secondary | ICD-10-CM | POA: Diagnosis not present

## 2019-12-22 DIAGNOSIS — Z131 Encounter for screening for diabetes mellitus: Secondary | ICD-10-CM | POA: Diagnosis not present

## 2019-12-23 LAB — LIPID PANEL
Cholesterol: 166 (ref 0–200)
HDL: 39 (ref 35–70)
LDL Cholesterol: 106
LDl/HDL Ratio: 2.7
Triglycerides: 113 (ref 40–160)

## 2019-12-23 LAB — HEMOGLOBIN A1C: Hemoglobin A1C: 6

## 2019-12-23 LAB — HM PAP SMEAR: HM Pap smear: NORMAL

## 2019-12-23 LAB — VITAMIN D 25 HYDROXY (VIT D DEFICIENCY, FRACTURES): Vit D, 25-Hydroxy: 42.4

## 2019-12-23 LAB — TSH: TSH: 4.28 (ref 0.41–5.90)

## 2019-12-23 LAB — HM MAMMOGRAPHY

## 2019-12-26 DIAGNOSIS — Z01419 Encounter for gynecological examination (general) (routine) without abnormal findings: Secondary | ICD-10-CM | POA: Diagnosis not present

## 2019-12-26 DIAGNOSIS — Z6832 Body mass index (BMI) 32.0-32.9, adult: Secondary | ICD-10-CM | POA: Diagnosis not present

## 2020-01-29 ENCOUNTER — Encounter: Payer: Self-pay | Admitting: Family Medicine

## 2020-02-12 ENCOUNTER — Other Ambulatory Visit: Payer: Self-pay | Admitting: Family Medicine

## 2020-05-12 DIAGNOSIS — N390 Urinary tract infection, site not specified: Secondary | ICD-10-CM | POA: Diagnosis not present

## 2020-05-12 DIAGNOSIS — R319 Hematuria, unspecified: Secondary | ICD-10-CM | POA: Diagnosis not present

## 2020-05-12 DIAGNOSIS — U071 COVID-19: Secondary | ICD-10-CM | POA: Diagnosis not present

## 2020-05-13 ENCOUNTER — Telehealth: Payer: Self-pay

## 2020-05-13 NOTE — Telephone Encounter (Signed)
Patient called in stating she has been recently diagnosed with COVID and has been monitoring her oxygen and heart rate levels. Noticed that on her oximeter that her oxygen has been staying in the high 90's but her heart rate has been reading in low 60's in morning but then a recheck it goes down to mid 50's. Has been having light headedness; sent patient to triage.

## 2020-05-13 NOTE — Telephone Encounter (Signed)
That's reasonable- HR doesn't look too bad but interested in outcome of triage. I dont like that shes dizzy. Glad oxygen levels are ok

## 2020-05-13 NOTE — Telephone Encounter (Signed)
FYI

## 2020-05-13 NOTE — Telephone Encounter (Signed)
Please schedule pt for virtual.  

## 2020-05-13 NOTE — Telephone Encounter (Signed)
Nurse Assessment Nurse: Jimmye Norman, RN, Olin Hauser Date/Time Eilene Ghazi Time): 05/13/2020 2:30:43 PM Confirm and document reason for call. If symptomatic, describe symptoms. ---caller states HR is 60 now 56 at rest and COVID +. dizziness noted with headache.Marland Kitchen UTI RX cephalexin. 50 mg... started yesterday (at [facility]) no fever Does the patient have any new or worsening symptoms? ---Yes Will a triage be completed? ---Yes Related visit to physician within the last 2 weeks? ---Yes Does the PT have any chronic conditions? (i.e. diabetes, asthma, this includes High risk factors for pregnancy, etc.) ---No Is this a behavioral health or substance abuse call? ---No Guidelines Guideline Title Affirmed Question Affirmed Notes Nurse Date/Time (Eastern Time) COVID-19 - Diagnosed or Suspected [1] COVID-19 diagnosed by positive lab test (e.g., PCR, rapid selftest kit) AND [2] mild symptoms (e.g., cough, fever, others) AND [6] no complications or SOB Ruby Cola 05/13/2020 2:34:11 PM Heart Rate and Heartbeat Questions Dizziness, lightheadedness, or weakness Ruby Cola 05/13/2020 2:38:29 PM Disp. Time Eilene Ghazi Time) Disposition Final User 05/13/2020 2:37:55 PM [place], RN, Olin Hauser PLEASE NOTE: All timestamps contained within this report are represented as [place] Standard Time. CONFIDENTIALTY NOTICE: This fax transmission is intended only for the addressee. It contains information that is legally privileged, confidential or otherwise protected from use or disclosure. If you are not the intended recipient, you are strictly prohibited from reviewing, disclosing, copying using or disseminating any of this information or taking any action in reliance on or regarding this information. If you have received this fax in error, please notify us immediately by telephone so that we can arrange for its return to [place]. Phone: [phone], Toll-Free: [phone], Fax: [phone] Page: 2 of 2 Call  Id: [id number] 05/13/2020 2:42:29 PM Go to ED Now Yes Jimmye Norman, RN, Otho Najjar Disagree/Comply Disagree Caller Understands Yes PreDisposition Did not know what to do Care Advice Given Per Guideline HOME CARE: * Feeling dehydrated: Drink extra liquids. If the air in your home is dry, use a humidifier. * Fever: For fever over 101 F (38.3 C), take acetaminophen every 4 to 6 hours (Adults 650 mg) OR ibuprofen every 6 to 8 hours (Adults 400 mg). Before taking any medicine, read all the instructions on the package. Do not take aspirin unless your doctor has prescribed it for you. CALL BACK IF: * Fever over 103 F (39.4 C) * Chest pain or difficulty breathing occurs GO TO ED NOW: * Leave now. Drive carefully. ANOTHER ADULT SHOULD DRIVE: * It is better and safer if another adult drives instead of you. BRING MEDICINES: * Bring a list of your current medicines when you go to the Emergency Department (ER). Comments User: Tempie Donning, RN Date/Time Eilene Ghazi Time): 05/13/2020 2:35:50 PM UC yesrterday [address]

## 2020-05-14 NOTE — Telephone Encounter (Signed)
LVM asking patient to call back and get scheduled. 

## 2020-07-04 NOTE — Patient Instructions (Addendum)
No labs today  Call back to schedule shingrix #1 and then repeat in 2-6 months- schedule another nurse visit  discussed going back to 1000 units of vitamin D- if she begins to feel poorly like she did in the past on lower dose- can schedule future vitamin D that I ordered today- for lab draw - also ordered a1c that can be done at that time-   You may find this book helpful for your diabetes. It is available on [place] and is fairly inexpensive: Dr. Janene Harvey Program for Reversing Diabetes: The Scientifically Proven System for Reversing Diabetes without Drugs  Diabetes education-Get on youtube and access videos by Dr. Smiley Houseman (an endocrinologist. Listen to diabetes education parts 1-5)  myfitnesspal if needed app on apple and android   We will call you within two weeks about your referral to [facility] for colonoscopy. If you do not hear within 2 weeks, give [place] a call.   Recommended follow up: Return in about 6 months (around 01/02/2021) for physical or sooner if needed.

## 2020-07-04 NOTE — Progress Notes (Signed)
Phone [phone]   Subjective:  Patient presents today for their annual physical. Chief complaint-noted.   See problem oriented charting- ROS- full  review of systems was completed and negative per full ROS sheet  The following were reviewed and entered/updated in epic: Past Medical History:  Diagnosis Date  . Chicken pox   . Heart murmur    since childhood- not always heard  . Hyperlipidemia    borderline- no meds   Patient Active Problem List   Diagnosis Date Noted  . Elevated blood pressure reading 02/01/2019  . Hypothyroidism, unspecified 10/04/2018  . Vitamin D deficiency 10/04/2018   Past Surgical History:  Procedure Laterality Date  . CESAREAN SECTION     x2  . cosmetic knee surgery     as child    Family History  Problem Relation Age of Onset  . Arthritis Maternal Grandfather   . Breast cancer Maternal Aunt   . Prostate cancer Father   . Elevated Lipids Father   . Heart disease Father        mid 76s. had been remote smoker.   . Hypertension Father   . Diabetes Father        mid 13s  . Dementia Father        vascular based  . Elevated Lipids Mother   . Diabetes Paternal Grandfather   . Diabetes Maternal Grandmother   . Healthy Brother   . Colon cancer Neg Hx   . Colon polyps Neg Hx   . Esophageal cancer Neg Hx   . Rectal cancer Neg Hx   . Stomach cancer Neg Hx     Medications- reviewed and updated Current Outpatient Medications  Medication Sig Dispense Refill  . Calcium Citrate-Vitamin D (CALCIUM + D PO) Take 1 capsule by mouth daily.    . Cyanocobalamin (VITAMIN B 12 PO) Take 1 capsule by mouth daily.    Marland Kitchen levothyroxine (SYNTHROID) 50 MCG tablet TAKE 1 TABLET BY MOUTH EVERY DAY 90 tablet 3  . VITAMIN D, CHOLECALCIFEROL, PO Take by mouth.    . Omega-3 Fatty Acids (FISH OIL) 1000 MG CAPS Take by mouth. (Patient not taking: Reported on 07/05/2020)     No current facility-administered medications for this visit.    Allergies-reviewed and  updated Allergies  Allergen Reactions  . Codeine Nausea And Vomiting    Social History   Social History Narrative   Married 30 years October 2020. Daughter went to [place]. Son went to [place]. 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       Hobbies: tennis, plays mahjong in womens group      Objective  Objective:  BP 134/76   Pulse 60   Temp 98 F (36.7 C) (Temporal)   Ht 5\' 2"  (1.575 m)   Wt 179 lb (81.2 kg)   LMP 05/11/2014   SpO2 97%   BMI 32.74 kg/m  Gen: NAD, resting comfortably HEENT: Mucous membranes are moist. Oropharynx normal Neck: no thyromegaly CV: RRR no murmurs rubs or gallops Lungs: CTAB no crackles, wheeze, rhonchi Abdomen: soft/nontender/nondistended/normal bowel sounds. No rebound or guarding.  Ext: no edema Skin: warm, dry Neuro: grossly normal, moves all extremities, PERRLA   Assessment and Plan   56 y.o. female presenting for annual physical.  Health Maintenance counseling: 1. Anticipatory guidance: Patient counseled regarding regular dental exams -q6-16months, eye exams -  recommended updating exam,  avoiding smoking and second hand smoke , limiting alcohol to 1 beverage per day, no illicit drugs .   2. Risk factor reduction:  Advised patient of need for regular exercise and diet rich and fruits and vegetables to reduce risk of heart attack and stroke. Exercise-  Tennis 2 days a week and pickle ball a day a week- getting at least 150 mins a week. Diet-feels could cut down on snacking and sweets Wt Readings from Last 3 Encounters:  07/05/20 179 lb (81.2 kg)  02/01/19 180 lb (81.6 kg)  10/04/18 170 lb (77.1 kg)  3. Immunizations/screenings/ancillary studies- had zostavax 5 years ago and interested in shingrix- update today Immunization History  Administered Date(s) Administered  . Influenza,inj,Quad PF,6+ Mos 01/16/2015,  02/01/2019  . PFIZER(Purple Top)SARS-COV-2 Vaccination 07/11/2019, 08/04/2019, 04/22/2020  . Tdap 01/18/2006, 02/01/2019  4. Cervical cancer screening- 12/23/19 with Dr. Julien Girt with 2-3 year repeat planned 5. Breast cancer screening-  breast exam with GYn and mammogram 12/23/19 6. Colon cancer screening - 08/05/15 with 5 year repeat with history of tubular adenoma- interestingly had a fair amount of back pain afterwards. Had a bowel cleanout for another scan and symptoms resolved and did not recur.  Didn't end up even needing the scan.  7. Skin cancer screening- dermatology a year ago- going every 2 years per derm. advised regular sunscreen use. Denies worrisome, changing, or new skin lesions.  8. Birth control/STD check- postmenopause and monogomous 9. Osteoporosis screening at 39- considering next year with GYN -Never smoker  Status of chronic or acute concerns   #social update- caring for dad who is 25 with dementia nad had covid and some falls- looking at hospice and 24 hour care. Mom in 32 and largely healthy  #hypothyroidism S: compliant On thyroid medication-Levothyroxine 81mcg Lab Results  Component Value Date   TSH 4.28 12/23/2019    A/P:well controlled- continue current meds   #Vitamin D deficiency S: Medication: Vitamin D 5000 units daily around august 2021 Last vitamin D Lab Results  Component Value Date   VD25OH 42.4 12/23/2019  A/P: discussed going back to 1000 units of vitamin D- if she begins to feel poorly like she did in the past on lower dose- can schedule future vitamin D that I ordered today- for lab draw   #hyperlipidemia S: Medication: none  Lab Results  Component Value Date   CHOL 166 12/23/2019   HDL 39 12/23/2019   LDLCALC 106 12/23/2019   LDLDIRECT 118.0 03/14/2015   TRIG 113 12/23/2019   CHOLHDL 5 09/07/2018   A/P: mild elevations but 10 year ascvd ris l ow at 2.7%. does not need statin at this point- continue to work on [employer]  #  Hyperglycemia/insulin resistance/prediabetes- dad with diabetes S:  Medication: none Exercise and diet- see discussion above Lab Results  Component Value Date   HGBA1C 6.0 12/23/2019   A/P: discussed importance of lifestyle changes with regular exercise and whole foods plant based diet.   Recommended follow up: Return in about 6 months (around 01/02/2021) for physical or sooner if needed.  Lab/Order associations: NOT fasting   ICD-10-CM   1. Preventative health care  Z00.00 CANCELED: HIV Antibody (routine testing w rflx)    CANCELED: CBC with Differential/Platelet    CANCELED: Comprehensive metabolic panel    CANCELED: Lipid panel  2. Hypothyroidism, unspecified type  E03.9   3. Vitamin D deficiency  E55.9 VITAMIN D 25 Hydroxy (Vit-D Deficiency, Fractures)  4. Elevated TSH  R79.89  5. Hyperlipidemia, unspecified hyperlipidemia type  E78.5 CANCELED: CBC with Differential/Platelet    CANCELED: Comprehensive metabolic panel    CANCELED: Lipid panel  6. Screening for HIV (human immunodeficiency virus)  Z11.4 CANCELED: HIV Antibody (routine testing w rflx)  7. Screen for colon cancer  Z12.11 Ambulatory referral to Gastroenterology  8. Hyperglycemia  R73.9 Hemoglobin A1c    No orders of the defined types were placed in this encounter.   Return precautions advised.  Garret Reddish, MD

## 2020-07-05 ENCOUNTER — Ambulatory Visit (INDEPENDENT_AMBULATORY_CARE_PROVIDER_SITE_OTHER): Payer: BC Managed Care – PPO | Admitting: Family Medicine

## 2020-07-05 ENCOUNTER — Encounter: Payer: Self-pay | Admitting: Family Medicine

## 2020-07-05 ENCOUNTER — Other Ambulatory Visit: Payer: Self-pay

## 2020-07-05 VITALS — BP 134/76 | HR 60 | Temp 98.0°F | Ht 62.0 in | Wt 179.0 lb

## 2020-07-05 DIAGNOSIS — E559 Vitamin D deficiency, unspecified: Secondary | ICD-10-CM | POA: Diagnosis not present

## 2020-07-05 DIAGNOSIS — Z114 Encounter for screening for human immunodeficiency virus [HIV]: Secondary | ICD-10-CM

## 2020-07-05 DIAGNOSIS — E039 Hypothyroidism, unspecified: Secondary | ICD-10-CM | POA: Diagnosis not present

## 2020-07-05 DIAGNOSIS — E785 Hyperlipidemia, unspecified: Secondary | ICD-10-CM

## 2020-07-05 DIAGNOSIS — R739 Hyperglycemia, unspecified: Secondary | ICD-10-CM

## 2020-07-05 DIAGNOSIS — Z0000 Encounter for general adult medical examination without abnormal findings: Secondary | ICD-10-CM | POA: Diagnosis not present

## 2020-07-05 DIAGNOSIS — R7989 Other specified abnormal findings of blood chemistry: Secondary | ICD-10-CM | POA: Diagnosis not present

## 2020-07-05 DIAGNOSIS — Z1211 Encounter for screening for malignant neoplasm of colon: Secondary | ICD-10-CM

## 2020-08-25 ENCOUNTER — Encounter: Payer: Self-pay | Admitting: Family Medicine

## 2020-08-27 ENCOUNTER — Telehealth: Payer: Self-pay

## 2020-08-27 NOTE — Telephone Encounter (Signed)
Called pt and LVM to schedule in same day slot.

## 2020-08-27 NOTE — Telephone Encounter (Signed)
Scheduled pt on 4/26 with Dr.Hunter

## 2020-08-27 NOTE — Telephone Encounter (Signed)
Pt called wanting to schedule an appt with Dr. Yong Channel to review readings from heart and discuss cardiology information. First available is not until June 7th. Pt asked if she could get in sooner with Dr. Yong Channel. Ok to schedule in same day? Please advise.

## 2020-08-27 NOTE — Telephone Encounter (Signed)
Yes, SDA is fine. 

## 2020-09-02 NOTE — Progress Notes (Signed)
Phone [phone] In person visit   Subjective:   Brooke Khan is a 55 y.o. year old very pleasant female patient who presents for/with See problem oriented charting Chief Complaint  Patient presents with  . Heart rate readings    Heart rates started running in the 40's, was drinking lots of water and feeling lightheaded. Noticed about a month ago.    This visit occurred during the SARS-CoV-2 public health emergency.  Safety protocols were in place, including screening questions prior to the visit, additional usage of staff PPE, and extensive cleaning of exam room while observing appropriate contact time as indicated for disinfecting solutions.   Past Medical History-  Patient Active Problem List   Diagnosis Date Noted  . Elevated blood pressure reading 02/01/2019  . Hypothyroidism, unspecified 10/04/2018  . Vitamin D deficiency 10/04/2018    Medications- reviewed and updated Current Outpatient Medications  Medication Sig Dispense Refill  . Calcium Citrate-Vitamin D (CALCIUM + D PO) Take 1 capsule by mouth daily.    . Cyanocobalamin (VITAMIN B 12 PO) Take 1 capsule by mouth daily.    Marland Kitchen levothyroxine (SYNTHROID) 50 MCG tablet TAKE 1 TABLET BY MOUTH EVERY DAY 90 tablet 3  . Omega-3 Fatty Acids (FISH OIL) 1000 MG CAPS Take by mouth.    Marland Kitchen VITAMIN D, CHOLECALCIFEROL, PO Take by mouth.     No current facility-administered medications for this visit.     Objective:  BP 138/80   Pulse (!) 59   Temp 97.6 F (36.4 C) (Temporal)   Ht 5\' 2"  (1.575 m)   Wt 172 lb 12.8 oz (78.4 kg)   LMP 05/11/2014   SpO2 97%   BMI 31.61 kg/m  Gen: NAD, resting comfortably CV: RRR no murmurs rubs or gallops Lungs: CTAB no crackles, wheeze, rhonchi Abdomen: soft/nontender/nondistended/normal bowel sounds. No rebound or guarding.  Ext: no edema Skin: warm, dry Neuro: grossly normal, moves all extremities   EKG: Sinus bradycardiawith rate 56, normal axis, normal intervals, no  hypertrophy, no st or t wave changes     Assessment and Plan   #Bradycardia S: Patient has been monitoring her heart rate on her iPhone- has noted several heart rates below 40 for up to 10 minutes during sleep.  The lowest she has seen is 38.  Probably averages in the high 50s.  With exercise can get heart rate up as high as 170. 10 readings in last month down below 40 per her device and would stay for 10 minutes. Resting heart rate 46-54.   She has not had recent issues with lightheadedness or dizziness but has had those in the more distant past- more recently if this occurs drinks more water and seems to help- typically this is not associated with very low heart rate though.  No chest pain or shortness of breath.  No headache or blurry vision.  No abnormal fatigue. Tennis 3-4 days a week and feels fine with that- HR up to 170 with this.   TSH has been normal 12/23/2019 on 50 mcg levothyroxine- not recently changed  #epigastric abdominal pain- Patient played tennis in October of November of 2020- felt upper abdominal pain 5-6 hours after tennis that lasted through night and next day couldn't get out of bed and then the next day she was ine. Had 3 hour match last week- all day she was fine and then that next night about 24 hours later she had similar epigastric pain and ended up having to throw up- couldn't  walk next morning- next day she was fine.  A/P: Patient with sinus bradycardia on exam today.  We are going to check a TSH but with weight loss doubt significant hypothyroidism.  In the past she has run low normal or slightly low but on her home readings on her apple watch she has seen several daytime readings into 40s though and several nighttime readings under 40. Although I am not very confident on accuracy and she had not had any significant symptoms- we discussed monitoring vs. get cardiology's opinion just with how low she has been running- she prefers to see cardiology and referral placed  today -if she becomes symptomatic prior to visit she should let us know  Also has had 2 severe episodes of epigastric pain- I wonder if she may have gallstones- check CMP and ultrasound  #hypothyroidism S: compliant On thyroid medication-levothyroxine 50 mcg  Lab Results  Component Value Date   TSH 4.28 12/23/2019   A/P:hopefully controlled- update tsh with labs- for now continue current meds  # Hyperglycemia/insulin resistance/prediabetes S:  Medication: none Exercise and diet- patient doen 15 lbs on home scales. Playing tennis regularly Lab Results  Component Value Date   HGBA1C 6.0 12/23/2019    A/P: doing a great job with regular exercise and weight loss- will update a1c with labs  Recommended follow up: keep august visit- as needed prior to that visit for new or worsening symptoms Future Appointments  Date Time Provider [place]  12/13/2020  8:15 AM [facility]  12/17/2020  9:20 AM [name], MD [facility]   Lab/Order associations:   ICD-10-CM   1. Bradycardia  R00.1 EKG 12-Lead    Ambulatory referral to Cardiology  2. Epigastric abdominal pain  R10.13 Comprehensive metabolic panel    US Abdomen Limited RUQ (LIVER/GB)  3. Hyperglycemia  R73.9 Hemoglobin A1c  4. Hypothyroidism, unspecified type  E03.9 TSH   Time Spent: 36 minutes of total time (4:00 PM- 4:36 PM with EKG interpreted outside this time frame) was spent on the date of the encounter performing the following actions: chart review prior to seeing the patient, obtaining history, performing a medically necessary exam, counseling on the treatment plan, placing orders, and documenting in our EHR.   Return  precautions advised.  Garret Reddish, MD

## 2020-09-02 NOTE — Patient Instructions (Addendum)
Health Maintenance Due  Topic Date Due  . COLONOSCOPY please call to schedule 08/04/2020   Please stop by lab before you go If you have mychart- we will send your results within 3 business days of [place] receiving them.  If you do not have mychart- we will call you about results within 5 business days of [place] receiving them.  *please also note that you will see labs on mychart as soon as they post. I will later go in and write notes on them- will say "notes from Dr. Yong Channel"  We will call you within two weeks about your referral to ultrasound of gallbladder/liver with recurrent pain. If you do not hear within 2 weeks, give [place] a call.   We will call you within two weeks about your referral to cardiology as well. If you do not hear within 2 weeks, give [place] a call.

## 2020-09-03 ENCOUNTER — Other Ambulatory Visit: Payer: Self-pay

## 2020-09-03 ENCOUNTER — Encounter: Payer: Self-pay | Admitting: Family Medicine

## 2020-09-03 ENCOUNTER — Ambulatory Visit (INDEPENDENT_AMBULATORY_CARE_PROVIDER_SITE_OTHER): Payer: BC Managed Care – PPO | Admitting: Family Medicine

## 2020-09-03 VITALS — BP 138/80 | HR 59 | Temp 97.6°F | Ht 62.0 in | Wt 172.8 lb

## 2020-09-03 DIAGNOSIS — R1013 Epigastric pain: Secondary | ICD-10-CM

## 2020-09-03 DIAGNOSIS — R001 Bradycardia, unspecified: Secondary | ICD-10-CM | POA: Diagnosis not present

## 2020-09-03 DIAGNOSIS — E039 Hypothyroidism, unspecified: Secondary | ICD-10-CM | POA: Diagnosis not present

## 2020-09-03 DIAGNOSIS — R739 Hyperglycemia, unspecified: Secondary | ICD-10-CM

## 2020-09-04 ENCOUNTER — Encounter: Payer: Self-pay | Admitting: Gastroenterology

## 2020-09-04 LAB — COMPREHENSIVE METABOLIC PANEL
ALT: 17 U/L (ref 0–35)
AST: 14 U/L (ref 0–37)
Albumin: 4.3 g/dL (ref 3.5–5.2)
Alkaline Phosphatase: 60 U/L (ref 39–117)
BUN: 18 mg/dL (ref 6–23)
CO2: 26 mEq/L (ref 19–32)
Calcium: 9.7 mg/dL (ref 8.4–10.5)
Chloride: 105 mEq/L (ref 96–112)
Creatinine, Ser: 0.94 mg/dL (ref 0.40–1.20)
GFR: 67.56 mL/min (ref >60.00)
Glucose, Bld: 92 mg/dL (ref 70–99)
Potassium: 3.8 mEq/L (ref 3.5–5.1)
Sodium: 142 mEq/L (ref 135–145)
Total Bilirubin: 0.3 mg/dL (ref 0.2–1.2)
Total Protein: 7.4 g/dL (ref 6.0–8.3)

## 2020-09-04 LAB — HEMOGLOBIN A1C: Hgb A1c MFr Bld: 5.8 % (ref 4.6–6.5)

## 2020-09-04 LAB — TSH: TSH: 2.27 u[IU]/mL (ref 0.35–4.50)

## 2020-09-23 ENCOUNTER — Ambulatory Visit
Admission: RE | Admit: 2020-09-23 | Discharge: 2020-09-23 | Disposition: A | Discharge status: Home / Self Care | Payer: BC Managed Care – PPO | Source: Ambulatory Visit | Attending: Family Medicine | Admitting: Family Medicine

## 2020-09-23 DIAGNOSIS — K824 Cholesterolosis of gallbladder: Secondary | ICD-10-CM | POA: Diagnosis not present

## 2020-09-23 DIAGNOSIS — R1013 Epigastric pain: Secondary | ICD-10-CM

## 2020-10-15 ENCOUNTER — Ambulatory Visit: Payer: BC Managed Care – PPO | Admitting: Family Medicine

## 2020-11-01 ENCOUNTER — Encounter: Payer: Self-pay | Admitting: Physician Assistant

## 2020-11-01 ENCOUNTER — Ambulatory Visit (INDEPENDENT_AMBULATORY_CARE_PROVIDER_SITE_OTHER): Payer: BC Managed Care – PPO

## 2020-11-01 ENCOUNTER — Other Ambulatory Visit: Payer: Self-pay

## 2020-11-01 ENCOUNTER — Ambulatory Visit (INDEPENDENT_AMBULATORY_CARE_PROVIDER_SITE_OTHER): Payer: BC Managed Care – PPO | Admitting: Physician Assistant

## 2020-11-01 VITALS — BP 143/84 | HR 62 | Temp 98.1°F | Ht 62.0 in | Wt 165.2 lb

## 2020-11-01 DIAGNOSIS — K5900 Constipation, unspecified: Secondary | ICD-10-CM | POA: Diagnosis not present

## 2020-11-01 DIAGNOSIS — R1084 Generalized abdominal pain: Secondary | ICD-10-CM

## 2020-11-01 DIAGNOSIS — R109 Unspecified abdominal pain: Secondary | ICD-10-CM | POA: Diagnosis not present

## 2020-11-01 NOTE — Patient Instructions (Signed)
I will call with your official XR reading. Start with one half bottle of Magnesium Citrate OTC, and then finish the bottle if still no results after a few hours. Drink plenty of water. Call on Monday with an update for me of how you are feeling.

## 2020-11-01 NOTE — Progress Notes (Signed)
Acute Office Visit  Subjective:    Patient ID: Brooke Khan, female    DOB: Jul 10, 1965, 56 y.o.   MRN: [id number]  Chief Complaint  Patient presents with   Abdominal Pain   Constipation    HPI Patient is in today for abdominal pain x 1 month, worse in the last few weeks. Lower abdomen feels uncomfortable and swollen in the mornings. Constant full-feeling pain. She feels like she might be constipated. Last BM was normal this morning after she had some prunes. She is normally fairly regular after coffee in the mornings, but says it has been changing in the last month and she is going every 2-3 days instead. She is due for a colonoscopy (last one was 08/05/15 and says a polyp was found). No hx of cancer. Hx of two cesarean sections, otherwise no abdominal surgeries. She has lost weight since changing her diet to [place].   She had RUQ abdominal U/S done on 09/24/20 after seeing Dr. Yong Channel, who thought she might have gallstones. No stones were present, but she did have several polyps and was advised for recheck in 1 year.   CT abd/pelvis done 11/08/15 following her colonoscopy, was negative. She did have 5 mm GB polyp, left renal stone, 3 cm uterine fibroid, and aortic atherosclerosis present. Pt states pain then was different than what she is experiencing now.   Past Medical History:  Diagnosis Date   Bradycardia    Chicken pox    Elevated blood pressure reading    Epigastric abdominal pain    Heart murmur    since childhood- not always heard   Hyperglycemia    Hyperlipidemia    borderline- no meds   Hypothyroidism (acquired)    Lightheaded    Vitamin D deficiency     Past Surgical History:  Procedure Laterality Date   CESAREAN SECTION     x2   cosmetic knee surgery     as child    Family History  Problem Relation Age of Onset   Arthritis Maternal Grandfather    Breast cancer Maternal Aunt    Prostate cancer Father    Elevated Lipids Father    Heart disease  Father        mid 11s. had been remote smoker.    Hypertension Father    Diabetes Father        mid 86s   Dementia Father        vascular based   Elevated Lipids Mother    Diabetes Paternal Grandfather    Diabetes Maternal Grandmother    Healthy Brother    Colon cancer Neg Hx    Colon polyps Neg Hx    Esophageal cancer Neg Hx    Rectal cancer Neg Hx    Stomach cancer Neg Hx     Social History   Socioeconomic History   Marital status: Married    Spouse name: Not on file   Number of children: Not on file   Years of education: Not on file   Highest education level: Not on file  Occupational History   Not on file  Tobacco Use   Smoking status: Never   Smokeless tobacco: Never  Substance and Sexual Activity   Alcohol use: Yes    Alcohol/week: 0.0 standard drinks    Comment: occ; twice a month    Drug use: No   Sexual activity: Yes    Partners: Male    Comment: husband  Other Topics Concern  Not on file  Social History Narrative   Married 30 years October 2020. Daughter went to [place]. Son went to [place]. 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       Hobbies: tennis, plays mahjong in womens group      Social Determinants of Health   Financial Resource Strain: Not on file  Food Insecurity: Not on file  Transportation Needs: Not on file  Physical Activity: Not on file  Stress: Not on file  Social Connections: Not on file  Intimate Partner Violence: Not on file    Outpatient Medications Prior to Visit  Medication Sig Dispense Refill   Calcium Citrate-Vitamin D (CALCIUM + D PO) Take 1 capsule by mouth daily.     Cyanocobalamin (VITAMIN B 12 PO) Take 1 capsule by mouth daily.     levothyroxine (SYNTHROID) 50 MCG tablet TAKE 1 TABLET BY MOUTH EVERY DAY 90 tablet 3   Omega-3 Fatty Acids (FISH OIL) 1000 MG CAPS Take by mouth.     VITAMIN  D, CHOLECALCIFEROL, PO Take by mouth.     No facility-administered medications prior to visit.    Allergies  Allergen Reactions   Codeine Nausea And Vomiting    Review of Systems  Constitutional:  Positive for appetite change (has been skipping breakfast otherwise feels too full) and unexpected weight change (dietary change per patient). Negative for activity change, fatigue and fever.  Cardiovascular:  Negative for chest pain and leg swelling.  Gastrointestinal:  Positive for abdominal distention and abdominal pain. Negative for anal bleeding, blood in stool, diarrhea and nausea.  Genitourinary:  Negative for decreased urine volume, difficulty urinating, dyspareunia, dysuria, flank pain, frequency, hematuria, pelvic pain, urgency, vaginal bleeding, vaginal discharge and vaginal pain.  Musculoskeletal:  Negative for back pain.  Skin:  Negative for rash.  Neurological:  Negative for dizziness.      Objective:    Physical Exam Vitals and nursing note reviewed.  Constitutional:      General: She is not in acute distress.    Appearance: She is well-developed. She is obese. She is not ill-appearing.  Cardiovascular:     Rate and Rhythm: Normal rate and regular rhythm.     Heart sounds: No murmur heard. Pulmonary:     Effort: Pulmonary effort is normal.     Breath sounds: Normal breath sounds.  Abdominal:     General: Bowel sounds are normal.     Palpations: There is no fluid wave or hepatomegaly.     Tenderness: There is generalized abdominal tenderness. There is no right CVA tenderness, left CVA tenderness, guarding or rebound. Negative signs include Murphy's sign, Rovsing's sign, McBurney's sign, psoas sign and obturator sign.     Hernia: No hernia is present.     Comments: Periumbilical abdomen solid and firm, all lateral quadrants are soft. Generally tender all over without any acute abdominal findings.   Neurological:     Mental Status: She is alert.    BP (!) 143/84    Pulse 62   Temp 98.1 F (36.7 C)   Ht 5\' 2"  (1.575 m)   Wt 165 lb 3.2 oz (74.9 kg)   LMP 05/11/2014   SpO2 99%   BMI 30.22 kg/m  Wt Readings from Last 3 Encounters:  11/01/20 165 lb 3.2 oz (74.9 kg)  09/03/20 172 lb 12.8 oz (78.4 kg)  07/05/20 179 lb (81.2 kg)    Health Maintenance Due  Topic Date Due   Zoster Vaccines- Shingrix (1 of 2) Never done   COLONOSCOPY (Pts 45-46yrs Insurance coverage will need to be confirmed)  08/04/2020   COVID-19 Vaccine (4 - Booster for Pfizer series) 08/21/2020    There are no preventive care reminders to display for this patient.   Lab Results  Component Value Date   TSH 2.27 09/03/2020   Lab Results  Component Value Date   WBC 6.0 09/07/2018   HGB 14.2 09/07/2018   HCT 42.4 09/07/2018   MCV 91.4 09/07/2018   PLT 295.0 09/07/2018   Lab Results  Component Value Date   NA 142 09/03/2020   K 3.8 09/03/2020   CO2 26 09/03/2020   GLUCOSE 92 09/03/2020   BUN 18 09/03/2020   CREATININE 0.94 09/03/2020   BILITOT 0.3 09/03/2020   ALKPHOS 60 09/03/2020   AST 14 09/03/2020   ALT 17 09/03/2020   PROT 7.4 09/03/2020   ALBUMIN 4.3 09/03/2020   CALCIUM 9.7 09/03/2020   GFR 67.56 09/03/2020   Lab Results  Component Value Date   CHOL 166 12/23/2019   Lab Results  Component Value Date   HDL 39 12/23/2019   Lab Results  Component Value Date   LDLCALC 106 12/23/2019   Lab Results  Component Value Date   TRIG 113 12/23/2019   Lab Results  Component Value Date   CHOLHDL 5 09/07/2018   Lab Results  Component Value Date   HGBA1C 5.8 09/03/2020       Assessment & Plan:   Problem List Items Addressed This Visit   None Visit Diagnoses     Generalized abdominal pain    -  Primary   Relevant Orders   DG Abd 2 Views (Completed)   Constipation, unspecified constipation type          1. Generalized abdominal pain 2. Constipation, unspecified constipation type She certainly does not have an acute abdomen today.  I am  concerned about the periumbilical firmness on physical exam.  Her x-ray showed some constipation and patient did say she felt better after a bowel movement this morning.  We will try a cleanout this weekend with magnesium citrate.  She will call back on Monday to let me know how she is feeling.  Very low threshold to bring her back and push on her abdomen again and consider further imaging.  Total time spent on encounter today including reviewing x-ray in office with patient, as well as previous ultrasound and CT reports, documentation, as well as discussing plan with patient was 45 minutes.  This note was prepared with assistance of Systems analyst. Occasional wrong-word or sound-a-like substitutions may have occurred due to the inherent limitations of voice recognition software.   Angelle Isais M Lakyla Biswas, PA-C

## 2020-11-05 DIAGNOSIS — D259 Leiomyoma of uterus, unspecified: Secondary | ICD-10-CM | POA: Diagnosis not present

## 2020-11-05 DIAGNOSIS — K3580 Unspecified acute appendicitis: Secondary | ICD-10-CM | POA: Diagnosis not present

## 2020-11-05 DIAGNOSIS — K760 Fatty (change of) liver, not elsewhere classified: Secondary | ICD-10-CM | POA: Diagnosis not present

## 2020-11-05 DIAGNOSIS — R1031 Right lower quadrant pain: Secondary | ICD-10-CM | POA: Diagnosis not present

## 2020-11-05 DIAGNOSIS — R109 Unspecified abdominal pain: Secondary | ICD-10-CM | POA: Diagnosis not present

## 2020-11-05 DIAGNOSIS — D4959 Neoplasm of unspecified behavior of other genitourinary organ: Secondary | ICD-10-CM | POA: Diagnosis not present

## 2020-11-05 DIAGNOSIS — N838 Other noninflammatory disorders of ovary, fallopian tube and broad ligament: Secondary | ICD-10-CM | POA: Diagnosis not present

## 2020-11-05 DIAGNOSIS — R1084 Generalized abdominal pain: Secondary | ICD-10-CM | POA: Diagnosis not present

## 2020-11-07 ENCOUNTER — Telehealth: Payer: Self-pay

## 2020-11-07 ENCOUNTER — Ambulatory Visit: Payer: BC Managed Care – PPO | Admitting: Physician Assistant

## 2020-11-07 NOTE — Telephone Encounter (Signed)
I tried both patient's and husband's phone but did not go through and eventually went straight to voicemail on her phone.  I left a voicemail  I reviewed CT scan from [facility] in [place]-notes very large mass arising from the pelvis which is predominantly cystic with mural  nodularity and septation and it is arising from the right ovary.  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  Could be cystadenoma or serous cystadenoma or mucinous cystadenoma or adenocarcinoma.  GYN oncology evaluation as needed. - No definitive metastatic lesions in abdomen -Tiny nodule in the right lower lobe 5 mm right base - Fatty infiltration of the liver  Per notes this was discussed with patient and she was referred to [facility] gynecology oncology.  I certainly agree with gynecology oncology as next step

## 2020-11-07 NOTE — Telephone Encounter (Signed)
Patient called to check on if Dr. Yong Channel had a chance to look over her CT result that were sent over. Patient would like a call back.

## 2020-11-07 NOTE — Telephone Encounter (Signed)
Have you seen the results?

## 2020-11-08 DIAGNOSIS — C801 Malignant (primary) neoplasm, unspecified: Secondary | ICD-10-CM

## 2020-11-08 DIAGNOSIS — N83299 Other ovarian cyst, unspecified side: Secondary | ICD-10-CM | POA: Diagnosis not present

## 2020-11-08 DIAGNOSIS — R1903 Right lower quadrant abdominal swelling, mass and lump: Secondary | ICD-10-CM | POA: Diagnosis not present

## 2020-11-08 HISTORY — DX: Malignant (primary) neoplasm, unspecified: C80.1

## 2020-11-08 NOTE — Telephone Encounter (Signed)
Jazz, do you know if Dr. Yong Channel received pt's CT results? She is calling about them.

## 2020-11-09 ENCOUNTER — Encounter: Payer: Self-pay | Admitting: Family Medicine

## 2020-11-12 ENCOUNTER — Encounter: Payer: Self-pay | Admitting: Gynecologic Oncology

## 2020-11-13 ENCOUNTER — Telehealth: Payer: Self-pay | Admitting: *Deleted

## 2020-11-13 ENCOUNTER — Other Ambulatory Visit: Payer: Self-pay

## 2020-11-13 DIAGNOSIS — R001 Bradycardia, unspecified: Secondary | ICD-10-CM

## 2020-11-13 NOTE — Telephone Encounter (Signed)
Dr Yong Channel has seen these patient CT results, the patient came in the office and spoke with me. Patient stated that she was going to see her GYN as they directed her to from the Urgent care she was seen at.

## 2020-11-13 NOTE — Progress Notes (Signed)
Per Dr Caryl Comes order placed for 7 day ZIO for bradycardia (nocturnal)

## 2020-11-13 NOTE — Telephone Encounter (Signed)
Patient scheduled for ZIO XT monitor to be applied in office 11/14/20 at 12:30PM.

## 2020-11-14 ENCOUNTER — Other Ambulatory Visit: Payer: Self-pay

## 2020-11-14 ENCOUNTER — Inpatient Hospital Stay: Payer: BC Managed Care – PPO | Attending: Gynecologic Oncology | Admitting: Gynecologic Oncology

## 2020-11-14 ENCOUNTER — Inpatient Hospital Stay (HOSPITAL_BASED_OUTPATIENT_CLINIC_OR_DEPARTMENT_OTHER): Payer: BC Managed Care – PPO | Admitting: Gynecologic Oncology

## 2020-11-14 ENCOUNTER — Other Ambulatory Visit (INDEPENDENT_AMBULATORY_CARE_PROVIDER_SITE_OTHER): Payer: BC Managed Care – PPO

## 2020-11-14 ENCOUNTER — Encounter: Payer: Self-pay | Admitting: Gynecologic Oncology

## 2020-11-14 VITALS — BP 148/72 | HR 68 | Temp 97.0°F | Resp 18 | Ht 62.0 in | Wt 159.0 lb

## 2020-11-14 DIAGNOSIS — R001 Bradycardia, unspecified: Secondary | ICD-10-CM | POA: Insufficient documentation

## 2020-11-14 DIAGNOSIS — N83201 Unspecified ovarian cyst, right side: Secondary | ICD-10-CM | POA: Diagnosis not present

## 2020-11-14 DIAGNOSIS — E785 Hyperlipidemia, unspecified: Secondary | ICD-10-CM | POA: Insufficient documentation

## 2020-11-14 DIAGNOSIS — Z8042 Family history of malignant neoplasm of prostate: Secondary | ICD-10-CM | POA: Diagnosis not present

## 2020-11-14 DIAGNOSIS — Z803 Family history of malignant neoplasm of breast: Secondary | ICD-10-CM | POA: Insufficient documentation

## 2020-11-14 DIAGNOSIS — N83299 Other ovarian cyst, unspecified side: Secondary | ICD-10-CM

## 2020-11-14 DIAGNOSIS — R011 Cardiac murmur, unspecified: Secondary | ICD-10-CM | POA: Insufficient documentation

## 2020-11-14 DIAGNOSIS — E669 Obesity, unspecified: Secondary | ICD-10-CM | POA: Insufficient documentation

## 2020-11-14 DIAGNOSIS — E039 Hypothyroidism, unspecified: Secondary | ICD-10-CM | POA: Insufficient documentation

## 2020-11-14 DIAGNOSIS — R971 Elevated cancer antigen 125 [CA 125]: Secondary | ICD-10-CM

## 2020-11-14 MED ORDER — IBUPROFEN 800 MG PO TABS: 800.0000 mg | ORAL_TABLET | Freq: Three times a day (TID) | ORAL | 0 refills | Status: DC | PRN

## 2020-11-14 MED ORDER — SENNOSIDES-DOCUSATE SODIUM 8.6-50 MG PO TABS: 2.0000 | ORAL_TABLET | Freq: Every day | ORAL | 0 refills | Status: DC

## 2020-11-14 MED ORDER — LORAZEPAM 0.5 MG PO TABS: 0.5000 mg | ORAL_TABLET | Freq: Once | ORAL | 0 refills | Status: AC

## 2020-11-14 MED ORDER — TRAMADOL HCL 50 MG PO TABS: 50.0000 mg | ORAL_TABLET | Freq: Four times a day (QID) | ORAL | 0 refills | Status: DC | PRN

## 2020-11-14 NOTE — Progress Notes (Signed)
Consult Note: Gyn-Onc  Consult was requested by Dr. Julien Girt for the evaluation of Brooke Khan 55 y.o. female  CC:  Chief Complaint  Patient presents with   Complex ovarian cyst    Assessment/Plan:  Brooke Khan  is a 55 y.o.  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  We will obtain CT scan imaging to evaluate for occult metastatic disease and complexity of the mass.  Presuming CT findings high probability for malignancy, I am recommending exploratory laparotomy with unilateral salpingo-oophorectomy, possible hysterectomy BSO and staging if malignancy is confirmed on frozen section.  I explained the surgical procedure to the patient including its risks of  bleeding, infection, damage to internal organs (such as bladder,ureters, bowels), blood clot, reoperation and rehospitalization.  I explained that exploratory laparotomy is associated with approximately 2-day hospital stay in 4 weeks time off of work.  If CT imaging confirms a benign appearing cystic mass, she would be a candidate for mini laparotomy procedure with cyst decompression and robotic USO versus hysterectomy BSO and staging pending frozen section.   HPI: Brooke Khan is a 54 year old P2 who was seen in consultation at the request of Dr Julien Girt for evaluation of a 20cm ovarian complex mass and elevated CA 125.  The patient had progressively increasing discomfort in her epigastric region in April and May 2022.  This prompted her to tell her primary care provider about it who performed an abdominal x-ray which was suspicious for constipation.  The patient then use magnesium citrate for her constipation and developed sharp right-sided abdominal pains which prompted her to go to an emergency room at [facility] where she was on vacation at [employer].  A CT scan was performed which identified a complex cystic mass likely arising from an ovary.  She followed up with her  gynecologist, Dr. Julien Girt, on 11/08/2020.  Transvaginal ultrasound scan with abdominal ultrasound was performed and revealed a uterus measuring 8.8 x 5.6 x 2.5 cm with an endometrium of 1.2 mm.  There was a 19.9 x 18.6 x 18.6 cm complex cystic mass likely arising from the right ovary with low-level echoes seen in the right adnexa.  There was no normal ovarian tissue seen.  There was a slightly irregular solid area with papillary projections seen on the wall of the cyst but no blood flow within the main cystic mass and very minimal blood flow seen to the solid area.  The left ovary was not visualized.  For Ca1 25 was drawn on 11/08/2020 and was mildly elevated at 62.7.  CEA was normal at 2.2.  Her medical history is most significant for hypothyroidism, bradycardia with sleeping.  Her surgical history is most significant for 2 cesarean section.  Her gynecologic history is remarkable for 2 cesarean section.  Her family cancer history is significant for maternal aunt with breast cancer at age older than 80, father with prostate cancer.  She works as a Cabin crew. She lives with her husband.   Current Meds:  Outpatient Encounter Medications as of 11/14/2020  Medication Sig   Calcium Citrate-Vitamin D (CALCIUM + D PO) Take 1 capsule by mouth daily.   Cyanocobalamin (VITAMIN B 12 PO) Take 1 capsule by mouth daily.   levothyroxine (SYNTHROID) 50 MCG tablet TAKE 1 TABLET BY MOUTH EVERY DAY   Omega-3 Fatty Acids (FISH OIL) 1000 MG CAPS Take by mouth.   VITAMIN D, CHOLECALCIFEROL, PO Take by mouth.   No  facility-administered encounter medications on file as of 11/14/2020.    Allergy:  Allergies  Allergen Reactions   Codeine Nausea And Vomiting    Social Hx:   Social History   Socioeconomic History   Marital status: Married    Spouse name: Not on file   Number of children: Not on file   Years of education: Not on file   Highest education level: Not on file  Occupational History   Not on file   Tobacco Use   Smoking status: Never   Smokeless tobacco: Never  Vaping Use   Vaping Use: Never used  Substance and Sexual Activity   Alcohol use: Yes    Alcohol/week: 0.0 standard drinks    Comment: occ; twice a month    Drug use: No   Sexual activity: Yes    Partners: Male    Comment: husband  Other Topics Concern   Not on file  Social History Narrative   Married 30 years October 2020. Daughter went to [place]. Son went to [place]. In 2020, daughter Corporate treasurer in [place], son moving to [place] and to work for [employer] in Engineer, mining. Has a Biomedical scientist in [place].       Hobbies: tennis, plays mahjong in womens group      Social Determinants of Health   Financial Resource Strain: Not on file  Food Insecurity: Not on file  Transportation Needs: Not on file  Physical Activity: Not on file  Stress: Not on file  Social Connections: Not on file  Intimate Partner Violence: Not on file    Past Surgical Hx:  Past Surgical History:  Procedure Laterality Date   CESAREAN SECTION     x2   cosmetic knee surgery     as child    Past Medical Hx:  Past Medical History:  Diagnosis Date   Bradycardia    Chicken pox    Elevated blood pressure reading    Epigastric abdominal pain    Heart murmur    since childhood- not always heard   Hyperglycemia    Hyperlipidemia    borderline- no meds   Hypothyroidism (acquired)    Lightheaded    Vitamin D deficiency     Past Gynecological History: See HPI, the patient is menopausal Patient's last menstrual period was 05/11/2014.  Family Hx:  Family History  Problem Relation Age of Onset   Arthritis Maternal Grandfather    Breast cancer Maternal Aunt    Prostate cancer Father    Elevated Lipids Father    Heart disease Father        mid 86s. had been remote smoker.    Hypertension Father    Diabetes Father        mid 4s   Dementia Father        vascular based   Elevated Lipids Mother     Diabetes Paternal Grandfather    Diabetes Maternal Grandmother    Healthy Brother    Colon cancer Neg Hx    Colon polyps Neg Hx    Esophageal cancer Neg Hx    Rectal cancer Neg Hx    Stomach cancer Neg Hx     Review of Systems:  Constitutional  Feels well,   ENT Normal appearing ears and nares bilaterally Skin/Breast  No rash, sores, jaundice, itching, dryness Cardiovascular  No chest pain, shortness of breath, or edema  Pulmonary  No cough or wheeze.  Gastro Intestinal  + abdominal discomfort  and distension. Genito Urinary  No frequency, urgency, dysuria, no bleeding Musculo Skeletal  No myalgia, arthralgia, joint swelling or pain  Neurologic  No weakness, numbness, change in gait,  Psychology  No depression, anxiety, insomnia.   Vitals:  Blood pressure (!) 148/72, pulse 68, temperature (!) 97 F (36.1 C), temperature source Tympanic, resp. rate 18, height 5\' 2"  (1.575 m), weight 159 lb (72.1 kg), last menstrual period 05/11/2014, SpO2 100 %.  Physical Exam: WD in NAD Neck  Supple NROM, without any enlargements.  Lymph Node Survey No cervical supraclavicular or inguinal adenopathy Cardiovascular  Pulse normal rate, regularity and rhythm. S1 and S2 normal.  Lungs  Clear to auscultation bilateraly, without wheezes/crackles/rhonchi. Good air movement.  Skin  No rash/lesions/breakdown  Psychiatry  Alert and oriented to person, place, and time  Abdomen  Normoactive bowel sounds, abdomen non-tender and mildly obese/overweight. without evidence of hernia. There is a large abdominal mass extending into the epigastrium.  Back No CVA tenderness Genito Urinary  Vulva/vagina: Normal external female genitalia.  No lesions. No discharge or bleeding.  Bladder/urethra:  No lesions or masses, well supported bladder  Vagina: normal  Cervix: Normal appearing, no lesions.  Uterus:  Small, mobile, no parametrial involvement or nodularity.  Adnexa: large abdominopelvic mass  extending from the upper pelvis into the abdomen.   Rectal  Good tone, no masses no cul de sac nodularity.  Extremities  No bilateral cyanosis, clubbing or edema.  60 minutes of total time was spent for this patient encounter, including preparation, face-to-face counseling with the patient and coordination of care, review of imaging (results and images), communication with the referring provider and documentation of the encounter.   Thereasa Solo, MD  11/14/2020, 10:31 AM

## 2020-11-14 NOTE — Patient Instructions (Addendum)
Plan to have a CT scan prior to surgery and we will contact you with the results. Plan to take an ativan tablet one hour prior to your scan and have someone drive you.  Preparing for your Surgery  Plan for surgery on December 03, 2020 with Dr. Everitt Amber at [facility] will be scheduled for a exploratory laparotomy (larger incision on your abdomen), unilateral salpingo-oophorectomy (removal of one ovary and fallopian tube), possible total abdominal hysterectomy (removal of the uterus and cervix), possible bilateral salpingo-oophorectomy (removal of both ovaries and fallopian tubes), possible staging.   Pre-operative Testing -You will receive a phone call from presurgical testing at [facility] to arrange for a pre-operative appointment and lab work.  -Bring your insurance card, copy of an advanced directive if applicable, medication list  -At that visit, you will be asked to sign a consent for a possible blood transfusion in case a transfusion becomes necessary during surgery.  The need for a blood transfusion is rare but having consent is a necessary part of your care.     -You should not be taking blood thinners or aspirin at least ten days prior to surgery unless instructed by your surgeon.  -Do not take supplements such as fish oil (omega 3), red yeast rice, turmeric before your surgery. You want to avoid medications with aspirin in them including headache powders such as BC or Goody's), Excedrin migraine.  Day Before Surgery at [facility] will be asked to take in a light diet the day before surgery. You will be advised you can have clear liquids up until 3 hours before your surgery.    Eat a light diet the day before surgery.  Examples including soups, broths, toast, yogurt, mashed potatoes.  AVOID GAS PRODUCING FOODS. Things to avoid include carbonated beverages (fizzy beverages, sodas), raw fruits and raw vegetables (uncooked), or beans.   If your bowels are filled with  gas, your surgeon will have difficulty visualizing your pelvic organs which increases your surgical risks.  Your role in recovery Your role is to become active as soon as directed by your doctor, while still giving yourself time to heal.  Rest when you feel tired. You will be asked to do the following in order to speed your recovery:  - Cough and breathe deeply. This helps to clear and expand your lungs and can prevent pneumonia after surgery.  - Dolores. Do mild physical activity. Walking or moving your legs help your circulation and body functions return to normal. Do not try to get up or walk alone the first time after surgery.   -If you develop swelling on one leg or the other, pain in the back of your leg, redness/warmth in one of your legs, please call the office or go to the Emergency Room to have a doppler to rule out a blood clot. For shortness of breath, chest pain-seek care in the Emergency Room as soon as possible. - Actively manage your pain. Managing your pain lets you move in comfort. We will ask you to rate your pain on a scale of zero to 10. It is your responsibility to tell your doctor or nurse where and how much you hurt so your pain can be treated.  Special Considerations -If you are diabetic, you may be placed on insulin after surgery to have closer control over your blood sugars to promote healing and recovery.  This does not mean that you will be discharged  on insulin.  If applicable, your oral antidiabetics will be resumed when you are tolerating a solid diet.  -Your final pathology results from surgery should be available around one week after surgery and the results will be relayed to you when available.  -Dr. Lahoma Crocker is the surgeon that assists your GYN Oncologist with surgery.  If you end up staying the night, the next day after your surgery you will either see Dr. Denman George, Dr. Berline Lopes, or Dr. Lahoma Crocker.  -FMLA forms can be faxed to  [phone] and please allow 5-7 business days for completion.  Pain Management After Surgery -You have been prescribed your pain medication and bowel regimen medications before surgery so that you can have these available when you are discharged from the hospital. The pain medication is for use ONLY AFTER surgery and a new prescription will not be given.   -Make sure that you have Tylenol and Ibuprofen at home to use on a regular basis after surgery for pain control. We recommend alternating the medications every hour to six hours since they work differently and are processed in the body differently for pain relief.  -Review the attached handout on narcotic use and their risks and side effects.   Bowel Regimen -You have been prescribed Sennakot-S to take nightly to prevent constipation especially if you are taking the narcotic pain medication intermittently.  It is important to prevent constipation and drink adequate amounts of liquids. You can stop taking this medication when you are not taking pain medication and you are back on your normal bowel routine.  Risks of Surgery Risks of surgery are low but include bleeding, infection, damage to surrounding structures, re-operation, blood clots, and very rarely death.   Blood Transfusion Information (For the consent to be signed before surgery)  We will be checking your blood type before surgery so in case of emergencies, we will know what type of blood you would need.                                            WHAT IS A BLOOD TRANSFUSION?  A transfusion is the replacement of blood or some of its parts. Blood is made up of multiple cells which provide different functions. Red blood cells carry oxygen and are used for blood loss replacement. White blood cells fight against infection. Platelets control bleeding. Plasma helps clot blood. Other blood products are available for specialized needs, such as hemophilia or other clotting  disorders. BEFORE THE TRANSFUSION  Who gives blood for transfusions?  You may be able to donate blood to be used at a later date on yourself (autologous donation). Relatives can be asked to donate blood. This is generally not any safer than if you have received blood from a stranger. The same precautions are taken to ensure safety when a relative's blood is donated. Healthy volunteers who are fully evaluated to make sure their blood is safe. This is blood bank blood. Transfusion therapy is the safest it has ever been in the practice of medicine. Before blood is taken from a donor, a complete history is taken to make sure that person has no history of diseases nor engages in risky social behavior (examples are intravenous drug use or sexual activity with multiple partners). The donor's travel history is screened to minimize risk of transmitting infections, such as malaria. The donated blood is tested  for signs of infectious diseases, such as HIV and hepatitis. The blood is then tested to be sure it is compatible with you in order to minimize the chance of a transfusion reaction. If you or a relative donates blood, this is often done in anticipation of surgery and is not appropriate for emergency situations. It takes many days to process the donated blood. RISKS AND COMPLICATIONS Although transfusion therapy is very safe and saves many lives, the main dangers of transfusion include:  Getting an infectious disease. Developing a transfusion reaction. This is an allergic reaction to something in the blood you were given. Every precaution is taken to prevent this. The decision to have a blood transfusion has been considered carefully by your caregiver before blood is given. Blood is not given unless the benefits outweigh the risks.  AFTER SURGERY INSTRUCTIONS  Return to work: 4 weeks if applicable  Activity: 1. Be up and out of the bed during the day.  Take a nap if needed.  You may walk up steps but be  careful and use the hand rail.  Stair climbing will tire you more than you think, you may need to stop part way and rest.   2. No lifting or straining for 6 weeks over 10 pounds. No pushing, pulling, straining for 6 weeks.  3. No driving for 1-2 week(s).  Do not drive if you are taking narcotic pain medicine and make sure that your reaction time has returned.   4. You can shower as soon as the next day after surgery. Shower daily.  Use your regular soap and water (not directly on the incision) and pat your incision(s) dry afterwards; don't rub.  No tub baths or submerging your body in water until cleared by your surgeon. If you have the soap that was given to you by pre-surgical testing that was used before surgery, you do not need to use it afterwards because this can irritate your incisions.   5. No sexual activity and nothing in the vagina for 4 weeks (IF YOU HAVE A HYSTERECTOMY, NOTHING IN THE VAGINA FOR 6-8 WEEKS).  6. You may experience a small amount of clear drainage from your incision(s), which is normal.  If the drainage persists, increases, or changes color please call the office.  7. Do not use creams, lotions, or ointments such as neosporin on your incisions after surgery until advised by your surgeon because they can cause removal of the dermabond glue on your incisions.    8. You may experience vaginal spotting after surgery or around the 6-8 week mark from surgery when the stitches at the top of the vagina begin to dissolve (IF YOU HAVE A HYSTERECTOMY).  The spotting is normal but if you experience heavy bleeding, call our office.  9. Take Tylenol or ibuprofen first for pain and only use narcotic pain medication for severe pain not relieved by the Tylenol or Ibuprofen.  Monitor your Tylenol intake to a max of 4,000 mg in a 24 hour period. You can alternate these medications after surgery.  Diet: 1. Low sodium Heart Healthy Diet is recommended but you are cleared to resume your  normal (before surgery) diet after your procedure.  2. It is safe to use a laxative, such as Miralax or Colace, if you have difficulty moving your bowels. You have been prescribed Sennakot at bedtime every evening to keep bowel movements regular and to prevent constipation.    Wound Care: 1. Keep clean and dry.  Shower daily.  Reasons to call the Doctor: Fever - Oral temperature greater than 100.4 degrees Fahrenheit Foul-smelling vaginal discharge Difficulty urinating Nausea and vomiting Increased pain at the site of the incision that is unrelieved with pain medicine. Difficulty breathing with or without chest pain New calf pain especially if only on one side Sudden, continuing increased vaginal bleeding with or without clots.   Contacts: For questions or concerns you should contact:  Dr. Everitt Amber at [phone]  Joylene John, NP at [phone]  After Hours: call [phone] and have the GYN Oncologist paged/contacted (after 5 pm or on the weekends).  Messages sent via mychart are for non-urgent matters and are not responded to after hours so for urgent needs, please call the after hours number.

## 2020-11-14 NOTE — H&P (View-Only) (Signed)
Consult Note: Gyn-Onc  Consult was requested by Dr. Julien Girt for the evaluation of Brooke Khan 55 y.o. female  CC:  Chief Complaint  Patient presents with   Complex ovarian cyst    Assessment/Plan:  Brooke Khan  is a 55 y.o.  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  We will obtain CT scan imaging to evaluate for occult metastatic disease and complexity of the mass.  Presuming CT findings high probability for malignancy, I am recommending exploratory laparotomy with unilateral salpingo-oophorectomy, possible hysterectomy BSO and staging if malignancy is confirmed on frozen section.  I explained the surgical procedure to the patient including its risks of  bleeding, infection, damage to internal organs (such as bladder,ureters, bowels), blood clot, reoperation and rehospitalization.  I explained that exploratory laparotomy is associated with approximately 2-day hospital stay in 4 weeks time off of work.  If CT imaging confirms a benign appearing cystic mass, she would be a candidate for mini laparotomy procedure with cyst decompression and robotic USO versus hysterectomy BSO and staging pending frozen section.   HPI: Brooke Khan is a 55 year old P2 who was seen in consultation at the request of Dr Julien Girt for evaluation of a 20cm ovarian complex mass and elevated CA 125.  The patient had progressively increasing discomfort in her epigastric region in April and May 2022.  This prompted her to tell her primary care provider about it who performed an abdominal x-ray which was suspicious for constipation.  The patient then use magnesium citrate for her constipation and developed sharp right-sided abdominal pains which prompted her to go to an emergency room at [facility] where she was on vacation at [employer].  A CT scan was performed which identified a complex cystic mass likely arising from an ovary.  She followed up with her  gynecologist, Dr. Julien Girt, on 11/08/2020.  Transvaginal ultrasound scan with abdominal ultrasound was performed and revealed a uterus measuring 8.8 x 5.6 x 2.5 cm with an endometrium of 1.2 mm.  There was a 19.9 x 18.6 x 18.6 cm complex cystic mass likely arising from the right ovary with low-level echoes seen in the right adnexa.  There was no normal ovarian tissue seen.  There was a slightly irregular solid area with papillary projections seen on the wall of the cyst but no blood flow within the main cystic mass and very minimal blood flow seen to the solid area.  The left ovary was not visualized.  For Ca1 25 was drawn on 11/08/2020 and was mildly elevated at 62.7.  CEA was normal at 2.2.  Her medical history is most significant for hypothyroidism, bradycardia with sleeping.  Her surgical history is most significant for 2 cesarean section.  Her gynecologic history is remarkable for 2 cesarean section.  Her family cancer history is significant for maternal aunt with breast cancer at age older than 93, father with prostate cancer.  She works as a Cabin crew. She lives with her husband.   Current Meds:  Outpatient Encounter Medications as of 11/14/2020  Medication Sig   Calcium Citrate-Vitamin D (CALCIUM + D PO) Take 1 capsule by mouth daily.   Cyanocobalamin (VITAMIN B 12 PO) Take 1 capsule by mouth daily.   levothyroxine (SYNTHROID) 50 MCG tablet TAKE 1 TABLET BY MOUTH EVERY DAY   Omega-3 Fatty Acids (FISH OIL) 1000 MG CAPS Take by mouth.   VITAMIN D, CHOLECALCIFEROL, PO Take by mouth.   No  facility-administered encounter medications on file as of 11/14/2020.    Allergy:  Allergies  Allergen Reactions   Codeine Nausea And Vomiting    Social Hx:   Social History   Socioeconomic History   Marital status: Married    Spouse name: Not on file   Number of children: Not on file   Years of education: Not on file   Highest education level: Not on file  Occupational History   Not on file   Tobacco Use   Smoking status: Never   Smokeless tobacco: Never  Vaping Use   Vaping Use: Never used  Substance and Sexual Activity   Alcohol use: Yes    Alcohol/week: 0.0 standard drinks    Comment: occ; twice a month    Drug use: No   Sexual activity: Yes    Partners: Male    Comment: husband  Other Topics Concern   Not on file  Social History Narrative   Married 30 years October 2020. Daughter went to [place]. Son went to [place]. In 2020, daughter Corporate treasurer in [place], son moving to [place] and to work for [employer] in Engineer, mining. Has a Biomedical scientist in [place].       Hobbies: tennis, plays mahjong in womens group      Social Determinants of Health   Financial Resource Strain: Not on file  Food Insecurity: Not on file  Transportation Needs: Not on file  Physical Activity: Not on file  Stress: Not on file  Social Connections: Not on file  Intimate Partner Violence: Not on file    Past Surgical Hx:  Past Surgical History:  Procedure Laterality Date   CESAREAN SECTION     x2   cosmetic knee surgery     as child    Past Medical Hx:  Past Medical History:  Diagnosis Date   Bradycardia    Chicken pox    Elevated blood pressure reading    Epigastric abdominal pain    Heart murmur    since childhood- not always heard   Hyperglycemia    Hyperlipidemia    borderline- no meds   Hypothyroidism (acquired)    Lightheaded    Vitamin D deficiency     Past Gynecological History: See HPI, the patient is menopausal Patient's last menstrual period was 05/11/2014.  Family Hx:  Family History  Problem Relation Age of Onset   Arthritis Maternal Grandfather    Breast cancer Maternal Aunt    Prostate cancer Father    Elevated Lipids Father    Heart disease Father        mid 25s. had been remote smoker.    Hypertension Father    Diabetes Father        mid 76s   Dementia Father        vascular based   Elevated Lipids Mother     Diabetes Paternal Grandfather    Diabetes Maternal Grandmother    Healthy Brother    Colon cancer Neg Hx    Colon polyps Neg Hx    Esophageal cancer Neg Hx    Rectal cancer Neg Hx    Stomach cancer Neg Hx     Review of Systems:  Constitutional  Feels well,   ENT Normal appearing ears and nares bilaterally Skin/Breast  No rash, sores, jaundice, itching, dryness Cardiovascular  No chest pain, shortness of breath, or edema  Pulmonary  No cough or wheeze.  Gastro Intestinal  + abdominal discomfort  and distension. Genito Urinary  No frequency, urgency, dysuria, no bleeding Musculo Skeletal  No myalgia, arthralgia, joint swelling or pain  Neurologic  No weakness, numbness, change in gait,  Psychology  No depression, anxiety, insomnia.   Vitals:  Blood pressure (!) 148/72, pulse 68, temperature (!) 97 F (36.1 C), temperature source Tympanic, resp. rate 18, height 5\' 2"  (1.575 m), weight 159 lb (72.1 kg), last menstrual period 05/11/2014, SpO2 100 %.  Physical Exam: WD in NAD Neck  Supple NROM, without any enlargements.  Lymph Node Survey No cervical supraclavicular or inguinal adenopathy Cardiovascular  Pulse normal rate, regularity and rhythm. S1 and S2 normal.  Lungs  Clear to auscultation bilateraly, without wheezes/crackles/rhonchi. Good air movement.  Skin  No rash/lesions/breakdown  Psychiatry  Alert and oriented to person, place, and time  Abdomen  Normoactive bowel sounds, abdomen non-tender and mildly obese/overweight. without evidence of hernia. There is a large abdominal mass extending into the epigastrium.  Back No CVA tenderness Genito Urinary  Vulva/vagina: Normal external female genitalia.  No lesions. No discharge or bleeding.  Bladder/urethra:  No lesions or masses, well supported bladder  Vagina: normal  Cervix: Normal appearing, no lesions.  Uterus:  Small, mobile, no parametrial involvement or nodularity.  Adnexa: large abdominopelvic mass  extending from the upper pelvis into the abdomen.   Rectal  Good tone, no masses no cul de sac nodularity.  Extremities  No bilateral cyanosis, clubbing or edema.  60 minutes of total time was spent for this patient encounter, including preparation, face-to-face counseling with the patient and coordination of care, review of imaging (results and images), communication with the referring provider and documentation of the encounter.   Thereasa Solo, MD  11/14/2020, 10:31 AM

## 2020-11-15 ENCOUNTER — Other Ambulatory Visit: Payer: Self-pay | Admitting: Gynecologic Oncology

## 2020-11-15 DIAGNOSIS — N83299 Other ovarian cyst, unspecified side: Secondary | ICD-10-CM

## 2020-11-15 NOTE — Progress Notes (Signed)
Patient here with her husband for consultation with Dr. Everitt Amber and for a pre-operative discussion prior to her scheduled surgery on December 03, 2020. She is scheduled for an exploratory laparotomy (larger incision on your abdomen), unilateral salpingo-oophorectomy (removal of one ovary and fallopian tube), possible total abdominal hysterectomy (removal of the uterus and cervix), possible bilateral salpingo-oophorectomy (removal of both ovaries and fallopian tubes), possible staging. The surgery was discussed in detail.  See after visit summary for additional details. Visual aids used to discuss items related to surgery including the incentive spirometer, sequential compression stockings, foley catheter, IV pump, multi-modal pain regimen including tylenol, photo of the surgical robot, female reproductive system to discuss surgery in detail.      Discussed post-op pain management in detail including the aspects of the enhanced recovery pathway.  Advised her that a new prescription would be sent in for tramadol and it is only to be used for after her upcoming surgery.  We discussed the use of tylenol post-op and to monitor for a maximum of 4,000 mg in a 24 hour period.  Also prescribed sennakot to be used after surgery and to hold if having loose stools.  Discussed bowel regimen in detail.     Discussed the use of lovenox pre-op, SCDs, and measures to take at home to prevent DVT including frequent mobility.  Reportable signs and symptoms of DVT discussed. Post-operative instructions discussed and expectations for after surgery. Incisional care discussed as well including reportable signs and symptoms including erythema, drainage, wound separation.     10 minutes spent with the patient.  Verbalizing understanding of material discussed. No needs or concerns voiced at the end of the visit.   Advised patient and family to call for any needs.  Advised that her post-operative medications had been prescribed and could  be picked up at any time.     Information for upcoming CT scan discussed and reviewed precautions with taking oral ativan prior to the CT for claustrophobia.   This appointment is included in the surgical global fee and has no charge.

## 2020-11-15 NOTE — Patient Instructions (Signed)
Plan to have a CT scan prior to surgery and we will contact you with the results. Plan to take an ativan tablet one hour prior to your scan and have someone drive you.   Preparing for your Surgery   Plan for surgery on December 03, 2020 with Dr. Everitt Amber at [place] will be scheduled for a exploratory laparotomy (larger incision on your abdomen), unilateral salpingo-oophorectomy (removal of one ovary and fallopian tube), possible total abdominal hysterectomy (removal of the uterus and cervix), possible bilateral salpingo-oophorectomy (removal of both ovaries and fallopian tubes), possible staging.   Pre-operative Testing -You will receive a phone call from presurgical testing at [facility] to arrange for a pre-operative appointment and lab work.   -Bring your insurance card, copy of an advanced directive if applicable, medication list   -At that visit, you will be asked to sign a consent for a possible blood transfusion in case a transfusion becomes necessary during surgery.  The need for a blood transfusion is rare but having consent is a necessary part of your care.      -You should not be taking blood thinners or aspirin at least ten days prior to surgery unless instructed by your surgeon.   -Do not take supplements such as fish oil (omega 3), red yeast rice, turmeric before your surgery. You want to avoid medications with aspirin in them including headache powders such as BC or Goody's), Excedrin migraine.   Day Before Surgery at [place] will be asked to take in a light diet the day before surgery. You will be advised you can have clear liquids up until 3 hours before your surgery.     Eat a light diet the day before surgery.  Examples including soups, broths, toast, yogurt, mashed potatoes.  AVOID GAS PRODUCING FOODS. Things to avoid include carbonated beverages (fizzy beverages, sodas), raw fruits and raw vegetables (uncooked), or beans.   If your bowels are  filled with gas, your surgeon will have difficulty visualizing your pelvic organs which increases your surgical risks.   Your role in recovery Your role is to become active as soon as directed by your doctor, while still giving yourself time to heal.  Rest when you feel tired. You will be asked to do the following in order to speed your recovery:   - Cough and breathe deeply. This helps to clear and expand your lungs and can prevent pneumonia after surgery. - [place]. Do mild physical activity. Walking or moving your legs help your circulation and body functions return to normal. Do not try to get up or walk alone the first time after surgery.   -If you develop swelling on one leg or the other, pain in the back of your leg, redness/warmth in one of your legs, please call the office or go to the Emergency Room to have a doppler to rule out a blood clot. For shortness of breath, chest pain-seek care in the Emergency Room as soon as possible. - Actively manage your pain. Managing your pain lets you move in comfort. We will ask you to rate your pain on a scale of zero to 10. It is your responsibility to tell your doctor or nurse where and how much you hurt so your pain can be treated.   Special Considerations -If you are diabetic, you may be placed on insulin after surgery to have closer control over your blood sugars to promote healing and recovery.  This does not mean that you will be discharged on insulin.  If applicable, your oral antidiabetics will be resumed when you are tolerating a solid diet.   -Your final pathology results from surgery should be available around one week after surgery and the results will be relayed to you when available.   -Dr. Lahoma Crocker is the surgeon that assists your GYN Oncologist with surgery.  If you end up staying the night, the next day after your surgery you will either see Dr. Denman George, Dr. Berline Lopes, or Dr. Lahoma Crocker.   -FMLA forms  can be faxed to [phone] and please allow 5-7 business days for completion.   Pain Management After Surgery -You have been prescribed your pain medication and bowel regimen medications before surgery so that you can have these available when you are discharged from the hospital. The pain medication is for use ONLY AFTER surgery and a new prescription will not be given.   -Make sure that you have Tylenol and Ibuprofen at home to use on a regular basis after surgery for pain control. We recommend alternating the medications every hour to six hours since they work differently and are processed in the body differently for pain relief.   -Review the attached handout on narcotic use and their risks and side effects.   Bowel Regimen -You have been prescribed Sennakot-S to take nightly to prevent constipation especially if you are taking the narcotic pain medication intermittently.  It is important to prevent constipation and drink adequate amounts of liquids. You can stop taking this medication when you are not taking pain medication and you are back on your normal bowel routine.   Risks of Surgery Risks of surgery are low but include bleeding, infection, damage to surrounding structures, re-operation, blood clots, and very rarely death.     Blood Transfusion Information (For the consent to be signed before surgery)   We will be checking your blood type before surgery so in case of emergencies, we will know what type of blood you would need.                                             WHAT IS A BLOOD TRANSFUSION?   A transfusion is the replacement of blood or some of its parts. Blood is made up of multiple cells which provide different functions. Red blood cells carry oxygen and are used for blood loss replacement. White blood cells fight against infection. Platelets control bleeding. Plasma helps clot blood. Other blood products are available for specialized needs, such as hemophilia or  other clotting disorders. BEFORE THE TRANSFUSION Who gives blood for transfusions? You may be able to donate blood to be used at a later date on yourself (autologous donation). Relatives can be asked to donate blood. This is generally not any safer than if you have received blood from a stranger. The same precautions are taken to ensure safety when a relative's blood is donated. Healthy volunteers who are fully evaluated to make sure their blood is safe. This is blood bank blood. Transfusion therapy is the safest it has ever been in the practice of medicine. Before blood is taken from a donor, a complete history is taken to make sure that person has no history of diseases nor engages in risky social behavior (examples are intravenous drug use or sexual activity with multiple partners). The donor's  travel history is screened to minimize risk of transmitting infections, such as malaria. The donated blood is tested for signs of infectious diseases, such as HIV and hepatitis. The blood is then tested to be sure it is compatible with you in order to minimize the chance of a transfusion reaction. If you or a relative donates blood, this is often done in anticipation of surgery and is not appropriate for emergency situations. It takes many days to process the donated blood. RISKS AND COMPLICATIONS Although transfusion therapy is very safe and saves many lives, the main dangers of transfusion include: Getting an infectious disease. Developing a transfusion reaction. This is an allergic reaction to something in the blood you were given. Every precaution is taken to prevent this. The decision to have a blood transfusion has been considered carefully by your caregiver before blood is given. Blood is not given unless the benefits outweigh the risks.   AFTER SURGERY INSTRUCTIONS   Return to work: 4 weeks if applicable   Activity: 1. Be up and out of the bed during the day.  Take a nap if needed.  You may walk  up steps but be careful and use the hand rail.  Stair climbing will tire you more than you think, you may need to stop part way and rest.   2. No lifting or straining for 6 weeks over 10 pounds. No pushing, pulling, straining for 6 weeks.   3. No driving for 1-2 week(s).  Do not drive if you are taking narcotic pain medicine and make sure that your reaction time has returned.   4. You can shower as soon as the next day after surgery. Shower daily.  Use your regular soap and water (not directly on the incision) and pat your incision(s) dry afterwards; don't rub.  No tub baths or submerging your body in water until cleared by your surgeon. If you have the soap that was given to you by pre-surgical testing that was used before surgery, you do not need to use it afterwards because this can irritate your incisions.   5. No sexual activity and nothing in the vagina for 4 weeks (IF YOU HAVE A HYSTERECTOMY, NOTHING IN THE VAGINA FOR 6-8 WEEKS).   6. You may experience a small amount of clear drainage from your incision(s), which is normal.  If the drainage persists, increases, or changes color please call the office.   7. Do not use creams, lotions, or ointments such as neosporin on your incisions after surgery until advised by your surgeon because they can cause removal of the dermabond glue on your incisions.     8. You may experience vaginal spotting after surgery or around the 6-8 week mark from surgery when the stitches at the top of the vagina begin to dissolve (IF YOU HAVE A HYSTERECTOMY).  The spotting is normal but if you experience heavy bleeding, call our office.   9. Take Tylenol or ibuprofen first for pain and only use narcotic pain medication for severe pain not relieved by the Tylenol or Ibuprofen.  Monitor your Tylenol intake to a max of 4,000 mg in a 24 hour period. You can alternate these medications after surgery.   Diet: 1. Low sodium Heart Healthy Diet is recommended but you are  cleared to resume your normal (before surgery) diet after your procedure.   2. It is safe to use a laxative, such as Miralax or Colace, if you have difficulty moving your bowels. You have been prescribed  Sennakot at bedtime every evening to keep bowel movements regular and to prevent constipation.     Wound Care: 1. Keep clean and dry.  Shower daily.   Reasons to call the Doctor: Fever - Oral temperature greater than 100.4 degrees Fahrenheit Foul-smelling vaginal discharge Difficulty urinating Nausea and vomiting Increased pain at the site of the incision that is unrelieved with pain medicine. Difficulty breathing with or without chest pain New calf pain especially if only on one side Sudden, continuing increased vaginal bleeding with or without clots.   Contacts: For questions or concerns you should contact:   Dr. Everitt Amber at [phone]   Joylene John, NP at [phone]   After Hours: call [phone] and have the GYN Oncologist paged/contacted (after 5 pm or on the weekends).   Messages sent via mychart are for non-urgent matters and are not responded to after hours so for urgent needs, please call the after hours number.

## 2020-11-18 ENCOUNTER — Telehealth: Payer: Self-pay | Admitting: *Deleted

## 2020-11-18 NOTE — Telephone Encounter (Signed)
Fax request for reports and images for CT to [facility]

## 2020-11-20 ENCOUNTER — Other Ambulatory Visit: Payer: Self-pay

## 2020-11-20 ENCOUNTER — Ambulatory Visit (HOSPITAL_COMMUNITY)
Admission: RE | Admit: 2020-11-20 | Discharge: 2020-11-20 | Disposition: A | Discharge status: Home / Self Care | Payer: BC Managed Care – PPO | Source: Ambulatory Visit | Attending: Gynecologic Oncology | Admitting: Gynecologic Oncology

## 2020-11-20 ENCOUNTER — Encounter (HOSPITAL_COMMUNITY): Payer: Self-pay

## 2020-11-20 ENCOUNTER — Encounter: Payer: BC Managed Care – PPO | Admitting: Gastroenterology

## 2020-11-20 DIAGNOSIS — N83299 Other ovarian cyst, unspecified side: Secondary | ICD-10-CM | POA: Insufficient documentation

## 2020-11-20 DIAGNOSIS — N83201 Unspecified ovarian cyst, right side: Secondary | ICD-10-CM | POA: Diagnosis not present

## 2020-11-20 DIAGNOSIS — K6389 Other specified diseases of intestine: Secondary | ICD-10-CM | POA: Diagnosis not present

## 2020-11-20 DIAGNOSIS — C561 Malignant neoplasm of right ovary: Secondary | ICD-10-CM | POA: Diagnosis not present

## 2020-11-20 DIAGNOSIS — D259 Leiomyoma of uterus, unspecified: Secondary | ICD-10-CM | POA: Diagnosis not present

## 2020-11-20 MED ORDER — SODIUM CHLORIDE (PF) 0.9 % IJ SOLN
INTRAMUSCULAR | Status: AC
  Filled 2020-11-20: qty 50

## 2020-11-20 MED ORDER — IOHEXOL 350 MG/ML SOLN
80.0000 mL | Freq: Once | INTRAVENOUS | Status: AC | PRN
  Administered 2020-11-20: 80 mL via INTRAVENOUS

## 2020-11-21 ENCOUNTER — Encounter: Payer: Self-pay | Admitting: Gynecologic Oncology

## 2020-11-21 ENCOUNTER — Telehealth: Payer: Self-pay

## 2020-11-21 NOTE — Telephone Encounter (Signed)
Told Brooke Khan that Dr. Denman George reviewed her CT images from today and she feels that her surgery can be done robotically assisted laparoscopic with a little larger incision to decompress the cyst in a controlled fashion.  She should be able to go home the same day.  Pt verbalized understanding.

## 2020-11-22 ENCOUNTER — Ambulatory Visit: Payer: BC Managed Care – PPO | Admitting: Cardiology

## 2020-11-22 ENCOUNTER — Telehealth: Payer: Self-pay | Admitting: Gynecologic Oncology

## 2020-11-22 DIAGNOSIS — R001 Bradycardia, unspecified: Secondary | ICD-10-CM | POA: Diagnosis not present

## 2020-11-22 NOTE — Telephone Encounter (Signed)
Reviewed patient's mychart message and Dr. Denman George made aware as well. Called patient and discussed CT scan findings. Informed her Dr. Denman George had reviewed the images herself and was not overly concerned for malignancy but this will be known at the time of surgery. All questions answered. Advised to call for any further questions or needs.

## 2020-11-26 ENCOUNTER — Telehealth: Payer: Self-pay | Admitting: Internal Medicine

## 2020-11-26 NOTE — Progress Notes (Addendum)
COVID Vaccine Completed: yes x3 Date COVID Vaccine completed:07/11/19, 08/04/19 Has received booster: 04/22/20 COVID vaccine manufacturer: Pfizer      Date of COVID positive in last 90 days: N/A  PCP - Garret Reddish, MD Cardiologist - not yet  Chest x-ray - N/A EKG - 09/03/20 Epic Stress Test - N/A ECHO - N/A Cardiac Cath - N/A Pacemaker/ICD device last checked: N/A Spinal Cord Stimulator: N/a  Sleep Study - N/a CPAP -   Fasting Blood Sugar - pre DM  no sugar checks at home. Checks Blood Sugar _____ times a day  Blood Thinner Instructions: N/a Aspirin Instructions: Last Dose:  Activity level: Can go up a flight of stairs and perform activities of daily living without stopping and without symptoms of chest pain or shortness of breath.    Anesthesia review: heart murmur since childhood  Patient denies shortness of breath, fever, cough and chest pain at PAT appointment   Patient verbalized understanding of instructions that were given to them at the PAT appointment. Patient was also instructed that they will need to review over the PAT instructions again at home before surgery.

## 2020-11-26 NOTE — Patient Instructions (Addendum)
DUE TO COVID-19 ONLY ONE VISITOR IS ALLOWED TO COME WITH YOU AND STAY IN THE WAITING ROOM ONLY DURING PRE OP AND PROCEDURE.   **NO VISITORS ARE ALLOWED IN THE SHORT STAY AREA OR RECOVERY ROOM!!**        Your procedure is scheduled on: 12/03/20   Report to [facility] Main  Entrance    Report to admitting at 8:45 AM   Call this number if you have problems the morning of surgery [phone]   Do not eat food :After Midnight.   May have liquids until 7:45 AM day of surgery  CLEAR LIQUID DIET  Foods Allowed                                                                     Foods Excluded  Water, Black Coffee and tea, regular and decaf               liquids that you cannot  Plain Jell-O in any flavor  (No red)                                     see through such as: Fruit ices (not with fruit pulp)                                            milk, soups, orange juice              Iced Popsicles (No red)                                                All solid food                                   Apple juices Sports drinks like Gatorade (No red) Lightly seasoned clear broth or consume(fat free) Sugar, honey syrup    Take these medicines the morning of surgery with A SIP OF WATER: Levothyroxine                              You may not have any metal on your body including hair pins, jewelry, and body piercing             Do not wear make-up, lotions, powders, perfumes, or deodorant  Do not wear nail polish including gel and S&S, artificial/acrylic nails, or any other type of covering on natural nails including finger and toenails. If you have artificial nails, gel coating, etc. that needs to be removed by a nail salon please have this removed prior to surgery or surgery may need to be canceled/ delayed if the surgeon/ anesthesia feels like they are unable to be safely monitored.   Do not shave  48 hours prior to surgery.    Do not bring valuables to the  hospital. [facility] IS NOT             RESPONSIBLE   FOR VALUABLES.    Patients discharged the day of surgery will not be allowed to drive home.  Special Instructions: Bring a copy of your healthcare power of attorney and living will documents         the day of surgery if you haven't scanned them in before.  Please read over the following fact sheets you were given: IF YOU HAVE QUESTIONS ABOUT YOUR PRE OP INSTRUCTIONS PLEASE CALL [phone]- [place] - Preparing for Surgery Before surgery, you can play an important role.  Because skin is not sterile, your skin needs to be as free of germs as possible.  You can reduce the number of germs on your skin by washing with CHG (chlorahexidine gluconate) soap before surgery.  CHG is an antiseptic cleaner which kills germs and bonds with the skin to continue killing germs even after washing. Please DO NOT use if you have an allergy to CHG or antibacterial soaps.  If your skin becomes reddened/irritated stop using the CHG and inform your nurse when you arrive at Short Stay. Do not shave (including legs and underarms) for at least 48 hours prior to the first CHG shower.  You may shave your face/neck.  Please follow these instructions carefully:  1.  Shower with CHG Soap the night before surgery and the  morning of surgery.  2.  If you choose to wash your hair, wash your hair first as usual with your normal  shampoo.  3.  After you shampoo, rinse your hair and body thoroughly to remove the shampoo.                             4.  Use CHG as you would any other liquid soap.  You can apply chg directly to the skin and wash.  Gently with a scrungie or clean washcloth.  5.  Apply the CHG Soap to your body ONLY FROM THE NECK DOWN.   Do   not use on face/ open                           Wound or open sores. Avoid contact with eyes, ears mouth and   genitals (private parts).                       Wash face,  Genitals (private parts) with your normal soap.              6.  Wash thoroughly, paying special attention to the area where your    surgery  will be performed.  7.  Thoroughly rinse your body with warm water from the neck down.  8.  DO NOT shower/wash with your normal soap after using and rinsing off the CHG Soap.                9.  Pat yourself dry with a clean towel.            10.  Wear clean pajamas.            11.  Place clean sheets on your bed the night of your first shower and do not  sleep with pets. Day of Surgery : Do not apply any lotions/deodorants the morning of surgery.  Please wear  clean clothes to the hospital/surgery center.  FAILURE TO FOLLOW THESE INSTRUCTIONS MAY RESULT IN THE CANCELLATION OF YOUR SURGERY  PATIENT SIGNATURE_________________________________  NURSE SIGNATURE__________________________________  ________________________________________________________________________  WHAT IS A BLOOD TRANSFUSION? Blood Transfusion Information  A transfusion is the replacement of blood or some of its parts. Blood is made up of multiple cells which provide different functions. Red blood cells carry oxygen and are used for blood loss replacement. White blood cells fight against infection. Platelets control bleeding. Plasma helps clot blood. Other blood products are available for specialized needs, such as hemophilia or other clotting disorders. BEFORE THE TRANSFUSION  Who gives blood for transfusions?  Healthy volunteers who are fully evaluated to make sure their blood is safe. This is blood bank blood. Transfusion therapy is the safest it has ever been in the practice of medicine. Before blood is taken from a donor, a complete history is taken to make sure that person has no history of diseases nor engages in risky social behavior (examples are intravenous drug use or sexual activity with multiple partners). The donor's travel history is screened to minimize risk of transmitting infections, such as malaria. The donated blood is  tested for signs of infectious diseases, such as HIV and hepatitis. The blood is then tested to be sure it is compatible with you in order to minimize the chance of a transfusion reaction. If you or a relative donates blood, this is often done in anticipation of surgery and is not appropriate for emergency situations. It takes many days to process the donated blood. RISKS AND COMPLICATIONS Although transfusion therapy is very safe and saves many lives, the main dangers of transfusion include:  Getting an infectious disease. Developing a transfusion reaction. This is an allergic reaction to something in the blood you were given. Every precaution is taken to prevent this. The decision to have a blood transfusion has been considered carefully by your caregiver before blood is given. Blood is not given unless the benefits outweigh the risks. AFTER THE TRANSFUSION Right after receiving a blood transfusion, you will usually feel much better and more energetic. This is especially true if your red blood cells have gotten low (anemic). The transfusion raises the level of the red blood cells which carry oxygen, and this usually causes an energy increase. The nurse administering the transfusion will monitor you carefully for complications. HOME CARE INSTRUCTIONS  No special instructions are needed after a transfusion. You may find your energy is better. Speak with your caregiver about any limitations on activity for underlying diseases you may have. SEEK MEDICAL CARE IF:  Your condition is not improving after your transfusion. You develop redness or irritation at the intravenous (IV) site. SEEK IMMEDIATE MEDICAL CARE IF:  Any of the following symptoms occur over the next 12 hours: Shaking chills. You have a temperature by mouth above 102 F (38.9 C), not controlled by medicine. Chest, back, or muscle pain. People around you feel you are not acting correctly or are confused. Shortness of breath or  difficulty breathing. Dizziness and fainting. You get a rash or develop hives. You have a decrease in urine output. Your urine turns a dark color or changes to pink, red, or brown. Any of the following symptoms occur over the next 10 days: You have a temperature by mouth above 102 F (38.9 C), not controlled by medicine. Shortness of breath. Weakness after normal activity. The white part of the eye turns yellow (jaundice). You have a decrease in the amount of  urine or are urinating less often. Your urine turns a dark color or changes to pink, red, or brown. Document Released: 04/24/2000 Document Revised: 07/20/2011 Document Reviewed: 12/12/2007 [facility] Patient Information 2014 [place], [place].  _______________________________________________________________________

## 2020-11-26 NOTE — Telephone Encounter (Signed)
Patient called to see if her Monitor results have come back yet.  She states she is a close friend of Dr. Olin Pia wife, and Dr. Caryl Comes ordered a 7 day event monitor for her to wear.   She mailed it back 11/20/20. She is concerned because she is supposed to have surgery 12/03/20.  Please call patient

## 2020-11-27 ENCOUNTER — Encounter (HOSPITAL_COMMUNITY)
Admission: RE | Admit: 2020-11-27 | Discharge: 2020-11-27 | Disposition: A | Discharge status: Home / Self Care | Payer: BC Managed Care – PPO | Source: Ambulatory Visit | Attending: Gynecologic Oncology | Admitting: Gynecologic Oncology

## 2020-11-27 ENCOUNTER — Other Ambulatory Visit: Payer: Self-pay

## 2020-11-27 ENCOUNTER — Encounter (HOSPITAL_COMMUNITY): Payer: Self-pay

## 2020-11-27 DIAGNOSIS — Z01812 Encounter for preprocedural laboratory examination: Secondary | ICD-10-CM | POA: Diagnosis not present

## 2020-11-27 HISTORY — DX: Prediabetes: R73.03

## 2020-11-27 LAB — CBC
HCT: 43.7 % (ref 36.0–46.0)
Hemoglobin: 14.1 g/dL (ref 12.0–15.0)
MCH: 30.6 pg (ref 26.0–34.0)
MCHC: 32.3 g/dL (ref 30.0–36.0)
MCV: 94.8 fL (ref 80.0–100.0)
Platelets: 276 10*3/uL (ref 150–400)
RBC: 4.61 MIL/uL (ref 3.87–5.11)
RDW: 13.1 % (ref 11.5–15.5)
WBC: 7 10*3/uL (ref 4.0–10.5)
nRBC: 0 % (ref 0.0–0.2)

## 2020-11-27 LAB — BASIC METABOLIC PANEL
Anion gap: 10 (ref 5–15)
BUN: 16 mg/dL (ref 6–20)
CO2: 30 mmol/L (ref 22–32)
Calcium: 10 mg/dL (ref 8.9–10.3)
Chloride: 103 mmol/L (ref 98–111)
Creatinine, Ser: 0.74 mg/dL (ref 0.44–1.00)
GFR, Estimated: >60 mL/min (ref >60)
Glucose, Bld: 95 mg/dL (ref 70–99)
Potassium: 4.2 mmol/L (ref 3.5–5.1)
Sodium: 143 mmol/L (ref 135–145)

## 2020-11-27 NOTE — Telephone Encounter (Signed)
Monitor results and Dr Olin Pia comments have been released to [facility]

## 2020-12-02 ENCOUNTER — Telehealth: Payer: Self-pay

## 2020-12-02 NOTE — Telephone Encounter (Signed)
Telephone call to check on pre-operative status.  Patient compliant with pre-operative instructions.  Reinforced NPO after midnight.  No questions or concerns voiced.  Instructed to call for any needs.   

## 2020-12-03 ENCOUNTER — Encounter (HOSPITAL_COMMUNITY)
Admission: RE | Disposition: A | Discharge status: Home / Self Care | Payer: Self-pay | Source: Home / Self Care | Attending: Gynecologic Oncology

## 2020-12-03 ENCOUNTER — Ambulatory Visit (HOSPITAL_COMMUNITY)
Admission: RE | Admit: 2020-12-03 | Discharge: 2020-12-03 | Disposition: A | Discharge status: Home / Self Care | Payer: BC Managed Care – PPO | Attending: Gynecologic Oncology | Admitting: Gynecologic Oncology

## 2020-12-03 ENCOUNTER — Ambulatory Visit (HOSPITAL_COMMUNITY): Payer: BC Managed Care – PPO | Admitting: Physician Assistant

## 2020-12-03 ENCOUNTER — Encounter (HOSPITAL_COMMUNITY): Payer: Self-pay | Admitting: Gynecologic Oncology

## 2020-12-03 DIAGNOSIS — N83209 Unspecified ovarian cyst, unspecified side: Secondary | ICD-10-CM | POA: Diagnosis not present

## 2020-12-03 DIAGNOSIS — D398 Neoplasm of uncertain behavior of other specified female genital organs: Secondary | ICD-10-CM

## 2020-12-03 DIAGNOSIS — E559 Vitamin D deficiency, unspecified: Secondary | ICD-10-CM | POA: Diagnosis not present

## 2020-12-03 DIAGNOSIS — N83299 Other ovarian cyst, unspecified side: Secondary | ICD-10-CM | POA: Diagnosis not present

## 2020-12-03 DIAGNOSIS — Z79890 Hormone replacement therapy (postmenopausal): Secondary | ICD-10-CM | POA: Diagnosis not present

## 2020-12-03 DIAGNOSIS — Z8042 Family history of malignant neoplasm of prostate: Secondary | ICD-10-CM | POA: Diagnosis not present

## 2020-12-03 DIAGNOSIS — N9489 Other specified conditions associated with female genital organs and menstrual cycle: Secondary | ICD-10-CM

## 2020-12-03 DIAGNOSIS — C561 Malignant neoplasm of right ovary: Secondary | ICD-10-CM

## 2020-12-03 DIAGNOSIS — Z885 Allergy status to narcotic agent status: Secondary | ICD-10-CM | POA: Insufficient documentation

## 2020-12-03 DIAGNOSIS — C541 Malignant neoplasm of endometrium: Secondary | ICD-10-CM | POA: Diagnosis not present

## 2020-12-03 DIAGNOSIS — N838 Other noninflammatory disorders of ovary, fallopian tube and broad ligament: Secondary | ICD-10-CM | POA: Diagnosis not present

## 2020-12-03 DIAGNOSIS — R7303 Prediabetes: Secondary | ICD-10-CM | POA: Diagnosis not present

## 2020-12-03 DIAGNOSIS — R971 Elevated cancer antigen 125 [CA 125]: Secondary | ICD-10-CM

## 2020-12-03 DIAGNOSIS — Z803 Family history of malignant neoplasm of breast: Secondary | ICD-10-CM | POA: Diagnosis not present

## 2020-12-03 DIAGNOSIS — E039 Hypothyroidism, unspecified: Secondary | ICD-10-CM | POA: Diagnosis not present

## 2020-12-03 HISTORY — PX: LAPAROTOMY: SHX154

## 2020-12-03 HISTORY — PX: ROBOTIC ASSISTED TOTAL HYSTERECTOMY WITH BILATERAL SALPINGO OOPHERECTOMY: SHX6086

## 2020-12-03 LAB — TYPE AND SCREEN
ABO/RH(D): A POS
Antibody Screen: NEGATIVE

## 2020-12-03 LAB — ABO/RH: ABO/RH(D): A POS

## 2020-12-03 LAB — PREGNANCY, URINE: Preg Test, Ur: NEGATIVE

## 2020-12-03 SURGERY — LAPAROTOMY
Anesthesia: General | Site: Abdomen

## 2020-12-03 MED ORDER — SODIUM CHLORIDE 0.9 % IV SOLN
2.0000 g | INTRAVENOUS | Status: AC
Start: 2020-12-03 — End: 2020-12-03
  Administered 2020-12-03: 2 g via INTRAVENOUS
  Filled 2020-12-03: qty 2

## 2020-12-03 MED ORDER — SODIUM CHLORIDE 0.9 % IV SOLN: 250.0000 mL | INTRAVENOUS | Status: DC | PRN

## 2020-12-03 MED ORDER — LIDOCAINE 2% (20 MG/ML) 5 ML SYRINGE
INTRAMUSCULAR | Status: AC
  Filled 2020-12-03: qty 5

## 2020-12-03 MED ORDER — OXYCODONE HCL 5 MG PO TABS
5.0000 mg | ORAL_TABLET | Freq: Once | ORAL | Status: AC | PRN
Start: 2020-12-03 — End: 2020-12-03
  Administered 2020-12-03: 5 mg via ORAL

## 2020-12-03 MED ORDER — CELECOXIB 200 MG PO CAPS
400.0000 mg | ORAL_CAPSULE | ORAL | Status: AC
  Administered 2020-12-03: 400 mg via ORAL
  Filled 2020-12-03: qty 2

## 2020-12-03 MED ORDER — SCOPOLAMINE 1 MG/3DAYS TD PT72
1.0000 | MEDICATED_PATCH | TRANSDERMAL | Status: DC
Start: 2020-12-03 — End: 2020-12-03
  Administered 2020-12-03: 1.5 mg via TRANSDERMAL
  Filled 2020-12-03: qty 1

## 2020-12-03 MED ORDER — LIDOCAINE HCL 2 % IJ SOLN
INTRAMUSCULAR | Status: AC
  Filled 2020-12-03: qty 20

## 2020-12-03 MED ORDER — FENTANYL CITRATE (PF) 100 MCG/2ML IJ SOLN
INTRAMUSCULAR | Status: AC
  Filled 2020-12-03: qty 2

## 2020-12-03 MED ORDER — MIDAZOLAM HCL 5 MG/5ML IJ SOLN
INTRAMUSCULAR | Status: DC | PRN
  Administered 2020-12-03: 2 mg via INTRAVENOUS

## 2020-12-03 MED ORDER — TRAMADOL HCL 50 MG PO TABS: 50.0000 mg | ORAL_TABLET | Freq: Four times a day (QID) | ORAL | Status: DC | PRN

## 2020-12-03 MED ORDER — BUPIVACAINE HCL 0.25 % IJ SOLN
INTRAMUSCULAR | Status: DC | PRN
  Administered 2020-12-03: 18 mL
  Administered 2020-12-03: 32 mL

## 2020-12-03 MED ORDER — FENTANYL CITRATE (PF) 100 MCG/2ML IJ SOLN: 25.0000 ug | INTRAMUSCULAR | Status: DC | PRN

## 2020-12-03 MED ORDER — CHLORHEXIDINE GLUCONATE 0.12 % MT SOLN
15.0000 mL | Freq: Once | OROMUCOSAL | Status: AC
Start: 2020-12-03 — End: 2020-12-03
  Administered 2020-12-03: 15 mL via OROMUCOSAL

## 2020-12-03 MED ORDER — ONDANSETRON HCL 4 MG/2ML IJ SOLN
INTRAMUSCULAR | Status: DC | PRN
  Administered 2020-12-03: 4 mg via INTRAVENOUS

## 2020-12-03 MED ORDER — KETAMINE HCL 10 MG/ML IJ SOLN
INTRAMUSCULAR | Status: DC | PRN
  Administered 2020-12-03 (×2): 20 mg via INTRAVENOUS

## 2020-12-03 MED ORDER — KETOROLAC TROMETHAMINE 15 MG/ML IJ SOLN: 15.0000 mg | Freq: Four times a day (QID) | INTRAMUSCULAR | Status: DC

## 2020-12-03 MED ORDER — FENTANYL CITRATE (PF) 100 MCG/2ML IJ SOLN
INTRAMUSCULAR | Status: DC | PRN
  Administered 2020-12-03: 25 ug via INTRAVENOUS
  Administered 2020-12-03: 100 ug via INTRAVENOUS
  Administered 2020-12-03: 25 ug via INTRAVENOUS

## 2020-12-03 MED ORDER — ACETAMINOPHEN 500 MG PO TABS
1000.0000 mg | ORAL_TABLET | ORAL | Status: AC
  Administered 2020-12-03: 1000 mg via ORAL
  Filled 2020-12-03: qty 2

## 2020-12-03 MED ORDER — EPHEDRINE SULFATE-NACL 50-0.9 MG/10ML-% IV SOSY
PREFILLED_SYRINGE | INTRAVENOUS | Status: DC | PRN
  Administered 2020-12-03 (×3): 5 mg via INTRAVENOUS

## 2020-12-03 MED ORDER — PHENYLEPHRINE HCL-NACL 10-0.9 MG/250ML-% IV SOLN
INTRAVENOUS | Status: DC | PRN
  Administered 2020-12-03: 50 ug/min via INTRAVENOUS

## 2020-12-03 MED ORDER — ACETAMINOPHEN 650 MG RE SUPP
650.0000 mg | RECTAL | Status: DC | PRN
  Filled 2020-12-03: qty 1

## 2020-12-03 MED ORDER — 0.9 % SODIUM CHLORIDE (POUR BTL) OPTIME
TOPICAL | Status: DC | PRN
  Administered 2020-12-03: 1000 mL

## 2020-12-03 MED ORDER — DEXAMETHASONE SODIUM PHOSPHATE 4 MG/ML IJ SOLN: 4.0000 mg | INTRAMUSCULAR | Status: DC

## 2020-12-03 MED ORDER — LIDOCAINE HCL (PF) 2 % IJ SOLN
INTRAMUSCULAR | Status: DC | PRN
  Administered 2020-12-03: 1.5 mg/kg/h via INTRADERMAL

## 2020-12-03 MED ORDER — ONDANSETRON HCL 4 MG/2ML IJ SOLN
INTRAMUSCULAR | Status: AC
  Filled 2020-12-03: qty 2

## 2020-12-03 MED ORDER — MIDAZOLAM HCL 2 MG/2ML IJ SOLN
INTRAMUSCULAR | Status: AC
  Filled 2020-12-03: qty 2

## 2020-12-03 MED ORDER — OXYCODONE HCL 5 MG PO TABS
ORAL_TABLET | ORAL | Status: AC
  Filled 2020-12-03: qty 1

## 2020-12-03 MED ORDER — ROCURONIUM BROMIDE 10 MG/ML (PF) SYRINGE
PREFILLED_SYRINGE | INTRAVENOUS | Status: DC | PRN
  Administered 2020-12-03: 40 mg via INTRAVENOUS
  Administered 2020-12-03: 60 mg via INTRAVENOUS

## 2020-12-03 MED ORDER — FENTANYL CITRATE (PF) 250 MCG/5ML IJ SOLN
INTRAMUSCULAR | Status: AC
  Filled 2020-12-03: qty 5

## 2020-12-03 MED ORDER — POVIDONE-IODINE 10 % EX SWAB: 2.0000 "application " | Freq: Once | CUTANEOUS | Status: DC

## 2020-12-03 MED ORDER — BUPIVACAINE LIPOSOME 1.3 % IJ SUSP
20.0000 mL | Freq: Once | INTRAMUSCULAR | Status: AC
  Administered 2020-12-03: 20 mL
  Filled 2020-12-03: qty 20

## 2020-12-03 MED ORDER — DEXAMETHASONE SODIUM PHOSPHATE 10 MG/ML IJ SOLN
INTRAMUSCULAR | Status: AC
  Filled 2020-12-03: qty 1

## 2020-12-03 MED ORDER — ROCURONIUM BROMIDE 10 MG/ML (PF) SYRINGE
PREFILLED_SYRINGE | INTRAVENOUS | Status: AC
  Filled 2020-12-03: qty 10

## 2020-12-03 MED ORDER — STERILE WATER FOR IRRIGATION IR SOLN
Status: DC | PRN
  Administered 2020-12-03: 1000 mL

## 2020-12-03 MED ORDER — SUGAMMADEX SODIUM 200 MG/2ML IV SOLN
INTRAVENOUS | Status: DC | PRN
  Administered 2020-12-03: 200 mg via INTRAVENOUS

## 2020-12-03 MED ORDER — LACTATED RINGERS IR SOLN
Status: DC | PRN
  Administered 2020-12-03: 1000 mL

## 2020-12-03 MED ORDER — SODIUM CHLORIDE 0.9% FLUSH: 3.0000 mL | Freq: Two times a day (BID) | INTRAVENOUS | Status: DC

## 2020-12-03 MED ORDER — PHENYLEPHRINE HCL (PRESSORS) 10 MG/ML IV SOLN
INTRAVENOUS | Status: AC
  Filled 2020-12-03: qty 1

## 2020-12-03 MED ORDER — OXYCODONE HCL 5 MG/5ML PO SOLN: 5.0000 mg | Freq: Once | ORAL | Status: AC | PRN

## 2020-12-03 MED ORDER — LIDOCAINE 2% (20 MG/ML) 5 ML SYRINGE
INTRAMUSCULAR | Status: DC | PRN
  Administered 2020-12-03: 60 mg via INTRAVENOUS

## 2020-12-03 MED ORDER — KETAMINE HCL 10 MG/ML IJ SOLN
INTRAMUSCULAR | Status: AC
  Filled 2020-12-03: qty 1

## 2020-12-03 MED ORDER — FENTANYL CITRATE (PF) 100 MCG/2ML IJ SOLN
25.0000 ug | INTRAMUSCULAR | Status: DC | PRN
  Administered 2020-12-03: 25 ug via INTRAVENOUS
  Administered 2020-12-03 (×2): 50 ug via INTRAVENOUS

## 2020-12-03 MED ORDER — PROPOFOL 500 MG/50ML IV EMUL
INTRAVENOUS | Status: DC | PRN
  Administered 2020-12-03: 25 ug/kg/min via INTRAVENOUS

## 2020-12-03 MED ORDER — PROPOFOL 10 MG/ML IV BOLUS
INTRAVENOUS | Status: DC | PRN
  Administered 2020-12-03: 15 mg via INTRAVENOUS

## 2020-12-03 MED ORDER — GABAPENTIN 300 MG PO CAPS
300.0000 mg | ORAL_CAPSULE | ORAL | Status: AC
  Administered 2020-12-03: 300 mg via ORAL
  Filled 2020-12-03: qty 1

## 2020-12-03 MED ORDER — PROPOFOL 10 MG/ML IV BOLUS
INTRAVENOUS | Status: AC
  Filled 2020-12-03: qty 40

## 2020-12-03 MED ORDER — DEXAMETHASONE SODIUM PHOSPHATE 10 MG/ML IJ SOLN
INTRAMUSCULAR | Status: DC | PRN
  Administered 2020-12-03: 10 mg via INTRAVENOUS

## 2020-12-03 MED ORDER — ORAL CARE MOUTH RINSE: 15.0000 mL | Freq: Once | OROMUCOSAL | Status: AC

## 2020-12-03 MED ORDER — ENOXAPARIN SODIUM 40 MG/0.4ML IJ SOSY
40.0000 mg | PREFILLED_SYRINGE | INTRAMUSCULAR | Status: AC
  Administered 2020-12-03: 40 mg via SUBCUTANEOUS
  Filled 2020-12-03: qty 0.4

## 2020-12-03 MED ORDER — LACTATED RINGERS IV SOLN: INTRAVENOUS | Status: DC

## 2020-12-03 MED ORDER — PROMETHAZINE HCL 25 MG/ML IJ SOLN: 6.2500 mg | INTRAMUSCULAR | Status: DC | PRN

## 2020-12-03 MED ORDER — ACETAMINOPHEN 325 MG PO TABS: 650.0000 mg | ORAL_TABLET | ORAL | Status: DC | PRN

## 2020-12-03 MED ORDER — SODIUM CHLORIDE 0.9% FLUSH: 3.0000 mL | INTRAVENOUS | Status: DC | PRN

## 2020-12-03 SURGICAL SUPPLY — 76 items
APPLICATOR SURGIFLO ENDO (HEMOSTASIS) IMPLANT
BACTOSHIELD CHG 4% 4OZ (MISCELLANEOUS) ×1
BAG COUNTER SPONGE SURGICOUNT (BAG) IMPLANT
BAG LAPAROSCOPIC 12 15 PORT 16 (BASKET) IMPLANT
BAG RETRIEVAL 12/15 (BASKET)
BLADE SURG SZ10 CARB STEEL (BLADE) ×2 IMPLANT
CELLS DAT CNTRL 66122 CELL SVR (MISCELLANEOUS) IMPLANT
COVER BACK TABLE 60X90IN (DRAPES) ×3 IMPLANT
COVER TIP SHEARS 8 DVNC (MISCELLANEOUS) ×2 IMPLANT
COVER TIP SHEARS 8MM DA VINCI (MISCELLANEOUS) ×1
DECANTER SPIKE VIAL GLASS SM (MISCELLANEOUS) IMPLANT
DERMABOND ADVANCED (GAUZE/BANDAGES/DRESSINGS) ×1
DERMABOND ADVANCED .7 DNX12 (GAUZE/BANDAGES/DRESSINGS) ×2 IMPLANT
DRAPE ARM DVNC X/XI (DISPOSABLE) ×8 IMPLANT
DRAPE COLUMN DVNC XI (DISPOSABLE) ×2 IMPLANT
DRAPE DA VINCI XI ARM (DISPOSABLE) ×4
DRAPE DA VINCI XI COLUMN (DISPOSABLE) ×1
DRAPE SHEET LG 3/4 BI-LAMINATE (DRAPES) ×3 IMPLANT
DRAPE SURG IRRIG POUCH 19X23 (DRAPES) ×3 IMPLANT
DRSG OPSITE POSTOP 4X6 (GAUZE/BANDAGES/DRESSINGS) IMPLANT
DRSG OPSITE POSTOP 4X8 (GAUZE/BANDAGES/DRESSINGS) IMPLANT
ELECT PENCIL ROCKER SW 15FT (MISCELLANEOUS) ×2 IMPLANT
ELECT REM PT RETURN 15FT ADLT (MISCELLANEOUS) ×3 IMPLANT
GAUZE 4X4 16PLY ~~LOC~~+RFID DBL (SPONGE) ×2 IMPLANT
GLOVE SURG ENC MOIS LTX SZ6 (GLOVE) ×12 IMPLANT
GLOVE SURG ENC MOIS LTX SZ6.5 (GLOVE) ×6 IMPLANT
GOWN STRL REUS W/ TWL LRG LVL3 (GOWN DISPOSABLE) ×8 IMPLANT
GOWN STRL REUS W/TWL LRG LVL3 (GOWN DISPOSABLE) ×4
HOLDER FOLEY CATH W/STRAP (MISCELLANEOUS) IMPLANT
IRRIG SUCT STRYKERFLOW 2 WTIP (MISCELLANEOUS) ×3
IRRIGATION SUCT STRKRFLW 2 WTP (MISCELLANEOUS) ×2 IMPLANT
KIT PROCEDURE DA VINCI SI (MISCELLANEOUS)
KIT PROCEDURE DVNC SI (MISCELLANEOUS) IMPLANT
KIT TURNOVER KIT A (KITS) ×3 IMPLANT
MANIPULATOR UTERINE 4.5 ZUMI (MISCELLANEOUS) ×3 IMPLANT
NDL SPNL 18GX3.5 QUINCKE PK (NEEDLE) IMPLANT
NEEDLE HYPO 22GX1.5 SAFETY (NEEDLE) ×3 IMPLANT
NEEDLE SPNL 18GX3.5 QUINCKE PK (NEEDLE) IMPLANT
OBTURATOR OPTICAL STANDARD 8MM (TROCAR) ×1
OBTURATOR OPTICAL STND 8 DVNC (TROCAR) ×2
OBTURATOR OPTICALSTD 8 DVNC (TROCAR) ×2 IMPLANT
PACK ROBOT GYN CUSTOM WL (TRAY / TRAY PROCEDURE) ×3 IMPLANT
PAD POSITIONING PINK XL (MISCELLANEOUS) ×3 IMPLANT
PORT ACCESS TROCAR AIRSEAL 12 (TROCAR) ×2 IMPLANT
PORT ACCESS TROCAR AIRSEAL 5M (TROCAR) ×1
POUCH SPECIMEN RETRIEVAL 10MM (ENDOMECHANICALS) ×10 IMPLANT
RETRACTOR WND ALEXIS 18 MED (MISCELLANEOUS) IMPLANT
RETRACTOR WND ALEXIS 25 LRG (MISCELLANEOUS) IMPLANT
RTRCTR WOUND ALEXIS 18CM MED (MISCELLANEOUS)
RTRCTR WOUND ALEXIS 25CM LRG (MISCELLANEOUS)
SCRUB CHG 4% DYNA-HEX 4OZ (MISCELLANEOUS) ×2 IMPLANT
SEAL CANN UNIV 5-8 DVNC XI (MISCELLANEOUS) ×6 IMPLANT
SEAL XI 5MM-8MM UNIVERSAL (MISCELLANEOUS) ×3
SET TRI-LUMEN FLTR TB AIRSEAL (TUBING) ×3 IMPLANT
SPONGE T-LAP 18X18 ~~LOC~~+RFID (SPONGE) ×2 IMPLANT
SURGIFLO W/THROMBIN 8M KIT (HEMOSTASIS) IMPLANT
SUT MNCRL AB 4-0 PS2 18 (SUTURE) IMPLANT
SUT PDS AB 1 TP1 96 (SUTURE) ×6 IMPLANT
SUT STRATAFIX SPIRAL PDS+ 0 30 (SUTURE) ×2 IMPLANT
SUT VIC AB 0 CT1 27 (SUTURE) ×2
SUT VIC AB 0 CT1 27XBRD ANTBC (SUTURE) ×3 IMPLANT
SUT VIC AB 2-0 CT1 27 (SUTURE)
SUT VIC AB 2-0 CT1 TAPERPNT 27 (SUTURE) IMPLANT
SUT VIC AB 4-0 PS2 18 (SUTURE) ×6 IMPLANT
SUT VLOC 180 0 9IN  GS21 (SUTURE) ×1
SUT VLOC 180 0 9IN GS21 (SUTURE) ×2 IMPLANT
SYR 10ML LL (SYRINGE) IMPLANT
SYR 20ML LL LF (SYRINGE) IMPLANT
SYR 50ML LL SCALE MARK (SYRINGE) IMPLANT
TOWEL OR NON WOVEN STRL DISP B (DISPOSABLE) ×3 IMPLANT
TRAP SPECIMEN MUCUS 40CC (MISCELLANEOUS) IMPLANT
TRAY FOLEY MTR SLVR 16FR STAT (SET/KITS/TRAYS/PACK) ×3 IMPLANT
TROCAR XCEL NON-BLD 5MMX100MML (ENDOMECHANICALS) IMPLANT
UNDERPAD 30X36 HEAVY ABSORB (UNDERPADS AND DIAPERS) ×3 IMPLANT
WATER STERILE IRR 1000ML POUR (IV SOLUTION) ×3 IMPLANT
YANKAUER SUCT BULB TIP 10FT TU (MISCELLANEOUS) ×2 IMPLANT

## 2020-12-03 NOTE — Transfer of Care (Signed)
Immediate Anesthesia Transfer of Care Note  Patient: Brooke Khan  Procedure(s) Performed: MINI LAPAROTOMY FOR CYST DECOMPRESSION (Abdomen) XI ROBOTIC ASSISTED TOTAL HYSTERECTOMY WITH BILATERAL SALPINGO OOPHORECTOMY WITH PERIAORTIC AND PELVIC LYMPHANDECTOMY AND OMENTECTOMY (Abdomen)  Patient Location: PACU  Anesthesia Type:General  Level of Consciousness: awake, alert , oriented and patient cooperative  Airway & Oxygen Therapy: Patient Spontanous Breathing and Patient connected to face mask oxygen  Post-op Assessment: Report given to RN and Post -op Vital signs reviewed and stable  Post vital signs: Reviewed and stable  Last Vitals:  Vitals Value Taken Time  BP 122/65 12/03/20 1358  Temp    Pulse 70 12/03/20 1359  Resp 20 12/03/20 1359  SpO2 97 % 12/03/20 1359  Vitals shown include unvalidated device data.  Last Pain:  Vitals:   12/03/20 0911  TempSrc: Oral  PainSc: 0-No pain      Patients Stated Pain Goal: 3 ([id number])  Complications: No notable events documented.

## 2020-12-03 NOTE — Addendum Note (Signed)
Addendum  created 12/03/20 1506 by Brennan Bailey, MD   Order list changed, Order sets accessed, Pharmacy for encounter modified

## 2020-12-03 NOTE — Interval H&P Note (Signed)
History and Physical Interval Note:  12/03/2020 9:54 AM  Willette Cluster Suzan Nailer  has presented today for surgery, with the diagnosis of OVARIAN MASS.  The various methods of treatment have been discussed with the patient and family. After consideration of risks, benefits and other options for treatment, the patient has consented to  Procedure(s): XI ROBOTIC ASSISTED UNILATERAL SALPINGO OOPHORECTOMY (N/A) MINI LAPAROTOMY FOR CYST DECOMPRESSION (N/A) POSSIBLE XI ROBOTIC ASSISTED TOTAL HYSTERECTOMY WITH BILATERAL SALPINGO OOPHORECTOMY, POSSIBLE STAGING (N/A) as a surgical intervention.  The patient's history has been reviewed, patient examined, no change in status, stable for surgery.  I have reviewed the patient's chart and labs.  Questions were answered to the patient's satisfaction.     Thereasa Solo

## 2020-12-03 NOTE — Anesthesia Postprocedure Evaluation (Signed)
Anesthesia Post Note  Patient: Brooke Khan  Procedure(s) Performed: MINI LAPAROTOMY FOR CYST DECOMPRESSION (Abdomen) XI ROBOTIC ASSISTED TOTAL HYSTERECTOMY WITH BILATERAL SALPINGO OOPHORECTOMY WITH PARA-AORTIC AND PELVIC LYMPHADNECTOMY AND OMENTECTOMY (Abdomen)     Patient location during evaluation: PACU Anesthesia Type: General Level of consciousness: awake and alert and oriented Pain management: pain level controlled Vital Signs Assessment: post-procedure vital signs reviewed and stable Respiratory status: spontaneous breathing, nonlabored ventilation and respiratory function stable Cardiovascular status: blood pressure returned to baseline Postop Assessment: no apparent nausea or vomiting Anesthetic complications: no   No notable events documented.  Last Vitals:  Vitals:   12/03/20 0911 12/03/20 1400  BP: (!) 156/79 120/69  Pulse: 62 70  Resp: 14 20  Temp: 36.4 C (!) 36.3 C  SpO2: 99% 97%    Last Pain:  Vitals:   12/03/20 1400  TempSrc:   PainSc: [name]

## 2020-12-03 NOTE — Anesthesia Preprocedure Evaluation (Signed)
Anesthesia Evaluation  Patient identified by MRN, date of birth, ID band Patient awake    Reviewed: Allergy & Precautions, NPO status , Patient's Chart, lab work & pertinent test results  Airway Mallampati: II  TM Distance: >3 FB     Dental  (+) Dental Advisory Given   Pulmonary neg pulmonary ROS,    breath sounds clear to auscultation       Cardiovascular negative cardio ROS   Rhythm:Regular Rate:Normal     Neuro/Psych negative neurological ROS     GI/Hepatic negative GI ROS, Neg liver ROS,   Endo/Other  Hypothyroidism   Renal/GU negative Renal ROS     Musculoskeletal   Abdominal   Peds  Hematology negative hematology ROS (+)   Anesthesia Other Findings   Reproductive/Obstetrics                             Lab Results  Component Value Date   WBC 7.0 11/27/2020   HGB 14.1 11/27/2020   HCT 43.7 11/27/2020   MCV 94.8 11/27/2020   PLT 276 11/27/2020   Lab Results  Component Value Date   CREATININE 0.74 11/27/2020   BUN 16 11/27/2020   NA 143 11/27/2020   K 4.2 11/27/2020   CL 103 11/27/2020   CO2 30 11/27/2020    Anesthesia Physical Anesthesia Plan  ASA: 2  Anesthesia Plan: General   Post-op Pain Management:    Induction: Intravenous  PONV Risk Score and Plan: 4 or greater and Ondansetron, Dexamethasone, Treatment may vary due to age or medical condition, Midazolam and Scopolamine patch - Pre-op  Airway Management Planned:   Additional Equipment:   Intra-op Plan:   Post-operative Plan: Extubation in OR  Informed Consent: I have reviewed the patients History and Physical, chart, labs and discussed the procedure including the risks, benefits and alternatives for the proposed anesthesia with the patient or authorized representative who has indicated his/her understanding and acceptance.     Dental advisory given  Plan Discussed with: CRNA  Anesthesia Plan  Comments:         Anesthesia Quick Evaluation

## 2020-12-03 NOTE — Op Note (Addendum)
OPERATIVE NOTE 12/03/20  Surgeon: Donaciano Eva   Assistants: Dr Lahoma Crocker (an MD assistant was necessary for tissue manipulation, management of robotic instrumentation, retraction and positioning due to the complexity of the case and hospital policies).   Anesthesia: General endotracheal anesthesia  ASA Class: 3   Pre-operative Diagnosis:  adnexal mass, elevated CA 125  Post-operative Diagnosis:  same , right ovarian cancer   Operation: Robotic-assisted laparoscopic total hysterectomy with bilateral salpingoophorectomy, omentectomy, pelvic and para-aortic lymphadnectomy.    Surgeon: Donaciano Eva  Assistant Surgeon: Lahoma Crocker MD  Anesthesia: GET  Urine Output: 1000cc  Operative Findings:  : 20cm cystic mass, smooth. Attachments between inferior aspect of cystic mass and posterior pelvic peritoneum. Normal appearing omentum and nodes. Invasive malignancy on evaluation.  No gross extra-ovarian disease. Clinical stage I disease. No gross residual disease.   Estimated Blood Loss:  Minimal      Total IV Fluids: 1,000 ml         Specimens:  uterus with cervix and bilateral tubes and ovaries, omentum, right and left para-aortic nodes, right and left pelvic nodes, anterior pelvic peritoneum, posterior pelvic peritoneum, right and left abdominal peritoneum, right and left pelvic peritoneum.         Complications:  None; patient tolerated the procedure well.         Disposition: PACU - hemodynamically stable.  Procedure Details  The patient was seen in the Holding Room. The risks, benefits, complications, treatment options, and expected outcomes were discussed with the patient.  The patient concurred with the proposed plan, giving informed consent.  The site of surgery properly noted/marked. The patient was identified as Brooke Khan and the procedure verified as a Robotic-assisted hysterectomy with bilateral salpingo oophorectomy. A Time Out was held  and the above information confirmed.  After induction of anesthesia, the patient was draped and prepped in the usual sterile manner. Pt was placed in supine position after anesthesia and draped and prepped in the usual sterile manner. The abdominal drape was placed after the CholoraPrep had been allowed to dry for 3 minutes.  Her arms were tucked to her side with all appropriate precautions.  The shoulders were stabilized with padded shoulder blocks applied to the acromium processes.  The patient was placed in the semi-lithotomy position in [place].  The perineum was prepped with Betadine. The patient was then prepped. Foley catheter was placed.  A sterile speculum was placed in the vagina.  The cervix was grasped with a single-tooth tenaculum and dilated with Kennon Rounds dilators.  The ZUMI uterine manipulator with a medium colpotomizer ring was placed without difficulty.  A pneum occluder balloon was placed over the manipulator.  OG tube placement was confirmed and to suction.    A midline vertical supraumbilical incision was made and carried through the subcutaneous tissue to the fascia. The fascial incision was made and extended superiorally. The rectus muscles were separated. The peritoneum was identified and entered. Peritoneal incision was extended longitudinally.  The abdominal cavity was entered sharply and without incident. A survey of the abdomen and pelvis revealed the above findings, which were significant for a smooth 20cm ovarian mass.  Washings were obtained from the peritoneal cavity.  An alexis retractor was placed.   The cystic mass was identified and brought to the incision. A gallbladder trochar was used to puncture the cyst and evacuate it of fluid to decompress it. No gross spillage of cyst fluid took place.Allis clamps were used to elevated the  puncture site as the cyst was deflated.  The cyst wall was then sutured at the puncture site with 0-vicryl.  A cap was placed on the  Alexis and the abdominal cavity was insufflated.   Next, a 12 mm skin incision was made 1 cm below the subcostal margin in the midclavicular line.  An airseal port was placed in this under direct visualization.  Next, 8 mm skin incisions were made at the level of the umbilicus in the mid right and left abdomen.  A fourth arm was placed in the left lower quadrant 2 cm above and superior and medial to the anterior superior iliac spine.  All ports were placed under direct visualization.  The patient was placed in steep Trendelenburg.  Bowel was folded away into the upper abdomen.  The robot was docked in the normal manner.  The hysterectomy was started after the round ligament on the right side was incised and the retroperitoneum was entered and the pararectal space was developed.  The ureter was noted to be on the medial leaf of the broad ligament.  The peritoneum above the ureter was incised and stretched and the infundibulopelvic ligament was skeletonized, cauterized and cut.  The posterior peritoneum was taken down to the level of the KOH ring.  The anterior peritoneum was also taken down.  The bladder flap was created to the level of the KOH ring.  The uterine artery on the right side was skeletonized, cauterized and cut in the normal manner.  A similar procedure was performed on the left.  The colpotomy was made and the uterus, cervix, bilateral ovaries and tubes were amputated and delivered through the vagina.  Pedicles were inspected and excellent hemostasis was achieved.    The colpotomy at the vaginal cuff was closed with Vicryl on a CT1 figure of 8's at the corners and 0-stratafix needle in a running manner.  Irrigation was used and excellent hemostasis was achieved.  At this point in the frozen section resulted as malignancy in the right ovary. And staging proceeded.  The para-aortic lymph node dissection was performed first on the right. The peritoneum overlying the lateral surface of the common  iliac and vena cava was opened vertically towards the duodenum. It was elevated as a shelf and the ureter was identified in this retroperitoneum. It was mobilized and retracted laterally with the 4th arm. The right para-aortic dissection took place by separating an enbloc segment of node containing fat from the mid portion of the common iliac distally, the retroperitoneal duodenum superiorally, the genitofemoral nerve laterally and the aorta medially.  Hemostasis was confirmed and was reinforced with Floseal.  The left para-aortic lymphadenectomy was performed by elevating the peritoneal edge and mobilizing the sigmoid mesentery from the underlying aorta. The left para-aortic retroperitoneal space (between the sigmoid mesentery and the lymph node bundle) was developed and the left ureter was identified and retracted with the forth arm. The left para-aortic nodes were removed en bloc by using monopolar and sharp dissection to separate all nodal fatty tissue between the mid portion of the left common iliac artery inferiorally and the IMA superiorally, the aorta medially, the vertebral bodies posteriorally and the tendon of the psoas muscle laterally.  The paravesical space was developed with monopolar and sharp dissection. It was held open with tension on the median umbilical ligament with the forth arm. The paravesical space was opened with blunt and sharp dissection to mobilize the ureter off of the medial surface of the internal iliac  artery. The medial leaf of the broad ligament containing the ureter was held medially (opening the pararectal space) by the assistant's grasper. The right pelvic lymphadenectomy was performed by skeletonizing the internal iliac artery at the bifurcation with the external iliac artery. The obturator nerve was identified in the base of lateral paravesical space. The ureter was mobilized medially off of the dissection by developing the pararectal space. The genitofemoral nerve was  identified, skeletonized and mobilized laterally off of the external iliac artery. An enbloc resection of lymph nodes was performed within the following boundaries: the mid portion of the common iliac proximally, the circumflex iliac vein distally, the obturator nerve posteriorally, the genitofemoral nerve laterally. The nodal basin (including obturator space) were confirmed to be empty of nodes and hemostatic. The nodes were placed in an endocatch bag and retrieved vaginally.  The paravesical space was developed with monopolar and sharp dissection. It was held open with tension on the median umbilical ligament with the forth arm. The paravesical space was opened with blunt and sharp dissection to mobilize the ureter off of the medial surface of the internal iliac artery. The medial leaf of the broad ligament containing the ureter was held medially (opening the pararectal space) by the assistant's grasper. The left pelvic lymphadenectomy was performed by skeletonizing the internal iliac artery at the bifurcation with the external iliac artery. The obturator nerve was identified in the base of lateral paravesical space. The ureter was mobilized medially off of the dissection by developing the pararectal space. The genitofemoral nerve was identified, skeletonized and mobilized laterally off of the external iliac artery. An enbloc resection of lymph nodes was performed within the following boundaries: the mid portion of the common iliac proximally, the circumflex iliac vein distally, the obturator nerve posteriorally, the genitofemoral nerve laterally. The nodal basin (including obturator space) were confirmed to be empty of nodes and hemostatic. The nodes were placed in an endocatch bag and retrieved vaginally.  Peritoneal biopsies were taken from normal appearing tissues at the anterior, posterior, left and right pelvis and mid left and right abdomen.  The omentectomy was then performed. The omentum was  delivered into the mid abdomen and elevated. The parietal peritonum that attaches the omentum to the transverse colon was incised facilitating separation of the mid portion of the omentum from the transverse colon. The right sided omentum with its vascular pedicles was then skeletonized in its attachments to the right transverse colon. These vascular pedicles were bipolar sealed and transected. Observation of the colonic wall occurred throughout. The dissection then moved progressively along the colon the the left splenic flexure of the transverse colon. The bipolar sealing forceps and the scissors were used to skeletonize vascular omental pedicles, seal them and transect them until the entire infracolic omentum had been freed from its transverse colonic attachments to the splenic flexure. The omentum was then placed in an endocatch bag and retrieved from the midline incision.   Irrigation of all sites confirmed hemostasis.   Robotic instruments were removed under direct visulaization.  The robot was undocked. The 10 mm ports were closed with Vicryl on a UR-5 needle and the fascia was closed with 0 Vicryl on a UR-5 needle.  The midline fascial incision was closed with #1 looped PDS running closure. The skin was closed with 4-0 Vicryl in a subcuticular manner.  Dermabond was applied.  Sponge, lap and needle counts correct x 2.  The patient was taken to the recovery room in stable condition.  The vagina  was swabbed with  minimal bleeding noted.   All instrument and needle counts were correct x  3.   The patient was transferred to the recovery room in a stable condition.  Donaciano Eva, MD

## 2020-12-03 NOTE — Anesthesia Procedure Notes (Signed)
Procedure Name: Intubation Date/Time: 12/03/2020 10:17 AM Performed by: Cleda Daub, CRNA Pre-anesthesia Checklist: Patient identified, Emergency Drugs available, Suction available and Patient being monitored Patient Re-evaluated:Patient Re-evaluated prior to induction Oxygen Delivery Method: Circle system utilized Preoxygenation: Pre-oxygenation with 100% oxygen Induction Type: IV induction Ventilation: Mask ventilation without difficulty Laryngoscope Size: Mac and 3 Grade View: Grade I Tube type: Oral Tube size: 7.0 mm Number of attempts: 1 (intubated by Purnell Shoemaker, [employer] parametic student) Airway Equipment and Method: Stylet and Oral airway Placement Confirmation: ETT inserted through vocal cords under direct vision, positive ETCO2 and breath sounds checked- equal and bilateral Secured at: 21 cm Tube secured with: Tape Dental Injury: Teeth and Oropharynx as per pre-operative assessment

## 2020-12-03 NOTE — Discharge Instructions (Signed)
Return to work: 4 weeks (2 weeks with physical restrictions).  Activity: 1. Be up and out of the bed during the day.  Take a nap if needed.  You may walk up steps but be careful and use the hand rail.  Stair climbing will tire you more than you think, you may need to stop part way and rest.   2. No lifting or straining for 4 weeks.  3. No driving for 1 weeks.  Do Not drive if you are taking narcotic pain medicine.  4. Shower daily.  Use soap and water on your incision and pat dry; don't rub.   5. No sexual activity and nothing in the vagina for 8 weeks.  Medications:  - Take ibuprofen and tylenol first line for pain control. Take these regularly (every 6 hours) to decrease the build up of pain.  - If necessary, for severe pain not relieved by ibuprofen, contact Dr Serita Grit office and you will be prescribed percocet.  - While taking percocet you should take sennakot every night to reduce the likelihood of constipation. If this causes diarrhea, stop its use.  Diet: 1. Low sodium Heart Healthy Diet is recommended.  2. It is safe to use a laxative if you have difficulty moving your bowels.   Wound Care: 1. Keep clean and dry.  Shower daily.  Reasons to call the Doctor:  Fever - Oral temperature greater than 100.4 degrees Fahrenheit Foul-smelling vaginal discharge Difficulty urinating Nausea and vomiting Increased pain at the site of the incision that is unrelieved with pain medicine. Difficulty breathing with or without chest pain New calf pain especially if only on one side Sudden, continuing increased vaginal bleeding with or without clots.   Follow-up: 1. See Everitt Amber in 4 weeks.  Contacts: For questions or concerns you should contact:  Dr. Everitt Amber at [phone] After hours and on week-ends call [phone] and ask to speak to the physician on call for Gynecologic Oncology

## 2020-12-04 ENCOUNTER — Telehealth: Payer: Self-pay | Admitting: *Deleted

## 2020-12-04 ENCOUNTER — Other Ambulatory Visit: Payer: Self-pay | Admitting: Gynecologic Oncology

## 2020-12-04 ENCOUNTER — Encounter (HOSPITAL_COMMUNITY): Payer: Self-pay | Admitting: Gynecologic Oncology

## 2020-12-04 DIAGNOSIS — G8918 Other acute postprocedural pain: Secondary | ICD-10-CM

## 2020-12-04 MED ORDER — OXYCODONE HCL 5 MG PO TABS: 5.0000 mg | ORAL_TABLET | ORAL | 0 refills | Status: DC | PRN

## 2020-12-04 NOTE — Telephone Encounter (Signed)
Post op phone call. I spoke with Brooke Khan ad she reports that she is feeling better this morning. Last night she had gas pain in her shoulders, spoke with Dr. Denman George and received a prescription for Oxycodone. She rates her pain this morning at a 4-5/10 when she is up and moving, when she is sitting still she denies pain. Pt is eating and drinking without difficulty. She has some burning with urination, denies passing gas or having BM yet. Encouraged her to drink 64oz of water per day. She did not take senakot yet and I encouraged her to do so. She denies fever or chills. Drsg and Incisions appear dry and intact. Clinic phone number given and future appts confirmed. She can call with any questions or concerns.

## 2020-12-06 ENCOUNTER — Telehealth: Payer: Self-pay | Admitting: Oncology

## 2020-12-06 ENCOUNTER — Other Ambulatory Visit: Payer: Self-pay | Admitting: Gynecologic Oncology

## 2020-12-06 DIAGNOSIS — G8918 Other acute postprocedural pain: Secondary | ICD-10-CM

## 2020-12-06 DIAGNOSIS — N83299 Other ovarian cyst, unspecified side: Secondary | ICD-10-CM

## 2020-12-06 DIAGNOSIS — R971 Elevated cancer antigen 125 [CA 125]: Secondary | ICD-10-CM

## 2020-12-06 LAB — CYTOLOGY - NON PAP

## 2020-12-06 MED ORDER — TRAMADOL HCL 50 MG PO TABS: 50.0000 mg | ORAL_TABLET | Freq: Four times a day (QID) | ORAL | 0 refills | Status: DC | PRN

## 2020-12-06 NOTE — Telephone Encounter (Signed)
Gwenlyn Perking (husband) called and requested a refill of tramadol to be sent to he [facility] on [address].  He said she has 1 tablet left due to doubling the dose the night of surgery per Dr. Denman George.  He said she is alternating taking oxycodone and tramadol during the day and just tramadol q 6 hours at night.

## 2020-12-06 NOTE — Telephone Encounter (Signed)
Called Brooke Khan back and advised her the tramadol refill has been sent.  She verbalized agreement and said she is feeling much better.  The gas is gone from her shoulder.  She is eating and drinking without issues, had a bm this afternoon, denies having trouble urinating and said her incisions are not draining but are achy.  Advised her to call if she has any questions or needs.

## 2020-12-09 ENCOUNTER — Encounter: Payer: Self-pay | Admitting: Gynecologic Oncology

## 2020-12-09 ENCOUNTER — Telehealth: Payer: Self-pay | Admitting: Gynecologic Oncology

## 2020-12-09 NOTE — Telephone Encounter (Signed)
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 Recommendation is for 3-6 cycles of adjuvant carboplatin and paclitaxel chemotherapy and genetic testing.   Will get this set up for her.  Thereasa Solo, MD

## 2020-12-10 ENCOUNTER — Telehealth: Payer: Self-pay | Admitting: Oncology

## 2020-12-10 ENCOUNTER — Ambulatory Visit: Payer: BC Managed Care – PPO | Admitting: Gynecologic Oncology

## 2020-12-10 DIAGNOSIS — C561 Malignant neoplasm of right ovary: Secondary | ICD-10-CM

## 2020-12-10 NOTE — Telephone Encounter (Signed)
Brooke Khan and scheduled new patient appointment with Dr. Alvy Bimler on 12/13/20 at 2:00.  Advised her to arrive at 1:30.  Also scheculed genetic counseling apt for 12/26/20 at 11:00.  She verbalized understanding and agreement of appointments.

## 2020-12-11 ENCOUNTER — Other Ambulatory Visit: Payer: Self-pay | Admitting: Gynecologic Oncology

## 2020-12-11 DIAGNOSIS — R971 Elevated cancer antigen 125 [CA 125]: Secondary | ICD-10-CM

## 2020-12-11 DIAGNOSIS — N83299 Other ovarian cyst, unspecified side: Secondary | ICD-10-CM

## 2020-12-12 ENCOUNTER — Encounter: Payer: BC Managed Care – PPO | Admitting: Gastroenterology

## 2020-12-13 ENCOUNTER — Inpatient Hospital Stay: Payer: BC Managed Care – PPO | Attending: Gynecologic Oncology | Admitting: Hematology and Oncology

## 2020-12-13 ENCOUNTER — Encounter: Payer: Self-pay | Admitting: Hematology and Oncology

## 2020-12-13 ENCOUNTER — Other Ambulatory Visit (INDEPENDENT_AMBULATORY_CARE_PROVIDER_SITE_OTHER): Payer: BC Managed Care – PPO

## 2020-12-13 ENCOUNTER — Inpatient Hospital Stay: Payer: BC Managed Care – PPO

## 2020-12-13 ENCOUNTER — Other Ambulatory Visit: Payer: Self-pay

## 2020-12-13 VITALS — BP 141/63 | HR 56 | Temp 97.4°F | Resp 18 | Ht 62.0 in | Wt 159.4 lb

## 2020-12-13 DIAGNOSIS — Z9221 Personal history of antineoplastic chemotherapy: Secondary | ICD-10-CM | POA: Diagnosis not present

## 2020-12-13 DIAGNOSIS — I700 Atherosclerosis of aorta: Secondary | ICD-10-CM | POA: Diagnosis not present

## 2020-12-13 DIAGNOSIS — E559 Vitamin D deficiency, unspecified: Secondary | ICD-10-CM | POA: Diagnosis not present

## 2020-12-13 DIAGNOSIS — C561 Malignant neoplasm of right ovary: Secondary | ICD-10-CM

## 2020-12-13 DIAGNOSIS — N390 Urinary tract infection, site not specified: Secondary | ICD-10-CM | POA: Diagnosis not present

## 2020-12-13 DIAGNOSIS — R300 Dysuria: Secondary | ICD-10-CM

## 2020-12-13 DIAGNOSIS — K5900 Constipation, unspecified: Secondary | ICD-10-CM | POA: Diagnosis not present

## 2020-12-13 DIAGNOSIS — N3000 Acute cystitis without hematuria: Secondary | ICD-10-CM

## 2020-12-13 DIAGNOSIS — R739 Hyperglycemia, unspecified: Secondary | ICD-10-CM | POA: Diagnosis not present

## 2020-12-13 DIAGNOSIS — E039 Hypothyroidism, unspecified: Secondary | ICD-10-CM | POA: Diagnosis not present

## 2020-12-13 DIAGNOSIS — K5909 Other constipation: Secondary | ICD-10-CM | POA: Diagnosis not present

## 2020-12-13 DIAGNOSIS — Z90722 Acquired absence of ovaries, bilateral: Secondary | ICD-10-CM | POA: Diagnosis not present

## 2020-12-13 DIAGNOSIS — E785 Hyperlipidemia, unspecified: Secondary | ICD-10-CM | POA: Insufficient documentation

## 2020-12-13 DIAGNOSIS — R011 Cardiac murmur, unspecified: Secondary | ICD-10-CM | POA: Insufficient documentation

## 2020-12-13 DIAGNOSIS — Z90710 Acquired absence of both cervix and uterus: Secondary | ICD-10-CM | POA: Diagnosis not present

## 2020-12-13 DIAGNOSIS — Z923 Personal history of irradiation: Secondary | ICD-10-CM | POA: Insufficient documentation

## 2020-12-13 LAB — URINALYSIS, COMPLETE (UACMP) WITH MICROSCOPIC
Bilirubin Urine: NEGATIVE
Glucose, UA: NEGATIVE mg/dL
Hgb urine dipstick: NEGATIVE
Ketones, ur: NEGATIVE mg/dL
Nitrite: NEGATIVE
Protein, ur: NEGATIVE mg/dL
Specific Gravity, Urine: 1.006 (ref 1.005–1.030)
pH: 8 (ref 5.0–8.0)

## 2020-12-13 LAB — HEMOGLOBIN A1C: Hgb A1c MFr Bld: 5.7 % (ref 4.6–6.5)

## 2020-12-13 LAB — VITAMIN D 25 HYDROXY (VIT D DEFICIENCY, FRACTURES): VITD: 27.79 ng/mL — ABNORMAL LOW (ref 30.00–100.00)

## 2020-12-13 MED ORDER — DEXAMETHASONE 4 MG PO TABS: ORAL_TABLET | ORAL | 6 refills | Status: DC

## 2020-12-13 MED ORDER — PROCHLORPERAZINE MALEATE 10 MG PO TABS: 10.0000 mg | ORAL_TABLET | Freq: Four times a day (QID) | ORAL | 1 refills | Status: DC | PRN

## 2020-12-13 MED ORDER — ONDANSETRON HCL 8 MG PO TABS: 8.0000 mg | ORAL_TABLET | Freq: Three times a day (TID) | ORAL | 1 refills | Status: DC | PRN

## 2020-12-13 NOTE — Progress Notes (Signed)
START ON PATHWAY REGIMEN - Ovarian     A cycle is every 21 days:     Paclitaxel      Carboplatin   **Always confirm dose/schedule in your pharmacy ordering system**  Patient Characteristics: Postoperative without Neoadjuvant Therapy (Pathologic Staging), Newly Diagnosed, Adjuvant Therapy, Any Stage I, Grade 3 BRCA Mutation Status: Awaiting Test Results Therapeutic Status: Postoperative without Neoadjuvant Therapy (Pathologic Staging) AJCC 8 Stage Grouping: IA AJCC M Category: cM0 AJCC T Category: pT1a AJCC N Category: pN0 Tumor Grade: 3 Intent of Therapy: Curative Intent, Discussed with Patient

## 2020-12-14 ENCOUNTER — Encounter: Payer: Self-pay | Admitting: Hematology and Oncology

## 2020-12-15 ENCOUNTER — Encounter: Payer: Self-pay | Admitting: Hematology and Oncology

## 2020-12-15 ENCOUNTER — Encounter: Payer: Self-pay | Admitting: Family Medicine

## 2020-12-15 ENCOUNTER — Other Ambulatory Visit: Payer: Self-pay | Admitting: Hematology and Oncology

## 2020-12-15 DIAGNOSIS — N390 Urinary tract infection, site not specified: Secondary | ICD-10-CM | POA: Insufficient documentation

## 2020-12-15 DIAGNOSIS — K5909 Other constipation: Secondary | ICD-10-CM | POA: Insufficient documentation

## 2020-12-15 DIAGNOSIS — N3000 Acute cystitis without hematuria: Secondary | ICD-10-CM

## 2020-12-15 LAB — URINE CULTURE: Culture: 20000 — AB

## 2020-12-15 MED ORDER — AMOXICILLIN 500 MG PO TABS: 500.0000 mg | ORAL_TABLET | Freq: Two times a day (BID) | ORAL | 0 refills | Status: DC

## 2020-12-15 MED ORDER — CIPROFLOXACIN HCL 250 MG PO TABS: 250.0000 mg | ORAL_TABLET | Freq: Two times a day (BID) | ORAL | 0 refills | Status: DC

## 2020-12-15 NOTE — Progress Notes (Signed)
[place] NOTE  Patient Care Team: [name], MD as PCP - General (Family Medicine)  ASSESSMENT & PLAN:  Ovarian cancer, right [facility]) We reviewed the NCCN guidelines We discussed the role of chemotherapy. The intent is of curative intent.  We discussed some of the risks, benefits, side-effects of carboplatin & Taxol. Treatment is intravenous, every 3 weeks x 6 cycles  Some of the short term side-effects included, though not limited to, including weight loss, life threatening infections, risk of allergic reactions, need for transfusions of blood products, nausea, vomiting, change in bowel habits, loss of hair, admission to hospital for various reasons, and risks of death.   Long term side-effects are also discussed including risks of infertility, permanent damage to nerve function, hearing loss, chronic fatigue, kidney damage with possibility needing hemodialysis, and rare secondary malignancy including bone marrow disorders.  The patient is aware that the response rates discussed earlier is not guaranteed.  After a long discussion, patient made an informed decision to proceed with the prescribed plan of care.   Patient education material was dispensed. We discussed premedication with dexamethasone before chemotherapy. I think it is reasonable to start her treatment after Labor Day.  She has plan a nice family vacation She does not need venous access We discussed cranial prosthesis versus hair preservation  UTI (urinary tract infection) At the time of dictation, urinalysis and urine culture confirmed urinary tract infection Initially, I called prescription of amoxicillin but has to change it to ciprofloxacin due to sensitivity I reviewed expected side effects with her  Other constipation We discussed importance of laxative therapy  Orders Placed This Encounter  Procedures   Urine Culture    Standing Status:   Future    Number of Occurrences:   1     Standing Expiration Date:   12/13/2021   CBC with Differential (Cancer Center Only)    Standing Status:   Standing    Number of Occurrences:   20    Standing Expiration Date:   12/13/2021   CMP ([place] only)    Standing Status:   Standing    Number of Occurrences:   20    Standing Expiration Date:   12/13/2021   Urinalysis, Complete w Microscopic    Standing Status:   Future    Number of Occurrences:   1    Standing Expiration Date:   12/13/2021    The total time spent in the appointment was 60 minutes encounter with patients including review of chart and various tests results, discussions about plan of care and coordination of care plan   All questions were answered. The patient knows to call the clinic with any problems, questions or concerns. No barriers to learning was detected.  [name], MD 8/7/202212:39 PM  CHIEF COMPLAINTS/PURPOSE OF CONSULTATION:  Ovarian cancer, for further management  HISTORY OF PRESENTING ILLNESS:  Brooke Khan 55 y.o. female is here because of recent diagnosis of ovarian cancer She is here accompanied by her husband Her initial symptoms started with vague intermittent abdominal discomfort and changes in bowel habits She underwent multiple evaluation leading to CT imaging and eventual surgery  I have reviewed her chart and materials related to her cancer extensively and collaborated history with the patient. Summary of oncologic history is as follows: Oncology History Overview Note  Endometrioid cancer FIGO grade 3   Ovarian cancer, right [facility])   Initial Diagnosis   Ovarian cancer, right ([place])   11/20/2020 Imaging  CT scan of abdomen and pelvis  1. There is a large, cystic mass of the right ovary with peripheral nodular soft tissue components measuring at least 19.8 x 13.3 x 19.8 cm. Findings are consistent with primary ovarian malignancy. 2. Minimal stranding of the omentum in the ventral abdomen and minimal thickening of the  peritoneum in the right lower quadrant in the right paracolic gutter without evidence of discrete omental or peritoneal nodularity. Findings are modestly suspicious for early peritoneal metastatic disease. 3. No lymphadenopathy or other evidence of metastatic disease in the abdomen or pelvis. 4. Polyp or adherent gallstone near the gallbladder fundus, better assessed by prior ultrasound. 5. Large burden of stool throughout the colon and rectum.   Aortic Atherosclerosis (ICD10-I70.0).   12/03/2020 Pathology Results   SURGICAL PATHOLOGY  CASE: [id number]  PATIENT: Brooke Khan  Surgical Pathology Report   Clinical History: Ovarian mass (crm)    FINAL MICROSCOPIC DIAGNOSIS:   A. UTERUS, CERVIX, BILATERAL FALLOPIAN TUBES AND OVARIES:  Ovary, right  -  Endometrioid carcinoma, FIGO grade 3  -  See oncology table and comment below   Ovary, left:  -  No carcinoma identified   Bilateral Fallopian tubes:  -  No carcinoma identified   Uterus:  -  No carcinoma identified   Cervix:  -  No carcinoma identified   B. LYMPH NODES, RIGHT PARAAORTIC, RESECTION:  -  No carcinoma identified in six lymph nodes (0/6)   C. LYMPH NODES, LEFT PARAAORTIC, RESECTION:  -  No carcinoma identified in five lymph nodes (0/5)   D. LYMPH NODES, RIGHT PELVIC, RESECTION:  -  No carcinoma identified in five lymph nodes (0/5)   E. LYMPH NODES, LEFT PELVIC, RESECTION:  -  No carcinoma identified in three lymph nodes (0/3)   F. OMENTUM, OMENTECTOMY:  -  No carcinoma identified   G. PERITONEUM, ANTERIOR PELVIC, BIOPSY:  -  No carcinoma identified   H. PERITONEUM, RIGHT PELVIC, BIOPSY:  -  No carcinoma identified   I. PERITONEUM, LEFT PELVIC, BIOPSY:  -  No carcinoma identified   J. PERITONEUM, POSTERIOR PELVIC, BIOPSY:  -  No carcinoma identified   K. PERITONEUM, RIGHT ABDOMINAL, BIOPSY:  -  No carcinoma identified   L. PERITONEUM, LEFT ABDOMINAL, BIOPSY:  -  No carcinoma identified     ONCOLOGY TABLE:   OVARY or FALLOPIAN TUBE or PRIMARY PERITONEUM: Resection   Procedure: Hysterectomy with bilateral salpingo-oophorectomy  Specimen Integrity: Intact  Tumor Site: Ovary, right  Tumor Size: 1.5 cm  Histologic Type: Endometrioid carcinoma  Histologic Grade: FIGO grade 3  Ovarian Surface Involvement: Not identified  Fallopian Tube Surface Involvement: Not identified  Other Tissue/ Organ Involvement: Not applicable  Largest Extrapelvic Peritoneal Focus: Not applicable  Peritoneal/Ascitic Fluid Involvement: Malignant cells not identified  Chemotherapy Response Score (CRS): Not applicable, no known presurgical therapy  Regional Lymph Nodes:       Number of Nodes with Metastasis Greater than 10 mm: 0       Number of Nodes with Metastasis 10 mm or Less (excludes isolated  tumor cells): 0       Number of Nodes with Isolated Tumor Cells (0.2 mm or less): 0       Number of Lymph Nodes Examined: 19  Distant Metastasis:       Distant Site(s) Involved: Not applicable  Pathologic Stage Classification (pTNM, AJCC 8th Edition): pT1a, pN0  Ancillary Studies: Can be performed upon request  Representative Tumor Block: A8  Comment(s): Dr. Vic Ripper  reviewed the case and agrees with the above diagnosis.     12/03/2020 Surgery   Surgeon: Donaciano Eva    Operation: Robotic-assisted laparoscopic total hysterectomy with bilateral salpingoophorectomy, omentectomy, pelvic and para-aortic lymphadnectomy.     Surgeon: Donaciano Eva    Operative Findings:  : 20cm cystic mass, smooth. Attachments between inferior aspect of cystic mass and posterior pelvic peritoneum. Normal appearing omentum and nodes. Invasive malignancy on evaluation. No gross extra-ovarian disease. Clinical stage I disease. No gross residual disease.    12/11/2020 Cancer Staging   Staging form: Ovary, Fallopian Tube, and Primary Peritoneal Carcinoma, AJCC 8th Edition - Pathologic stage from 12/11/2020:  FIGO Stage IA (pT1a, pN0, cM0) - Signed by [name], MD on 12/11/2020  Stage prefix: Initial diagnosis    01/14/2021 -  Chemotherapy    Patient is on Treatment Plan: OVARIAN CARBOPLATIN (AUC 6) / PACLITAXEL (175) Q21D X 6 CYCLES        She is doing well.  Her wound is healing.  Her bowel habits has improved.  She complained of vague abdominal/pressure sensation to her bladder  MEDICAL HISTORY:  Past Medical History:  Diagnosis Date   Bradycardia    Chicken pox    Elevated blood pressure reading    Epigastric abdominal pain    Heart murmur    since childhood- not always heard   Hyperglycemia    Hyperlipidemia    borderline- no meds   Hypothyroidism (acquired)    Lightheaded    Pre-diabetes    Vitamin D deficiency     SURGICAL HISTORY: Past Surgical History:  Procedure Laterality Date   CESAREAN SECTION     x2   cosmetic knee surgery     as child   LAPAROTOMY N/A 12/03/2020   Procedure: MINI LAPAROTOMY FOR CYST DECOMPRESSION;  Surgeon: Everitt Amber, MD;  Location: [facility];  Service: Gynecology;  Laterality: N/A;   ROBOTIC ASSISTED TOTAL HYSTERECTOMY WITH BILATERAL SALPINGO OOPHERECTOMY N/A 12/03/2020   Procedure: XI ROBOTIC ASSISTED TOTAL HYSTERECTOMY WITH BILATERAL SALPINGO OOPHORECTOMY WITH PARA-AORTIC AND PELVIC LYMPHADNECTOMY AND OMENTECTOMY;  Surgeon: Everitt Amber, MD;  Location: [facility];  Service: Gynecology;  Laterality: N/A;    SOCIAL HISTORY: Social History   Socioeconomic History   Marital status: Married    Spouse name: Not on file   Number of children: Not on file   Years of education: Not on file   Highest education level: Not on file  Occupational History   Not on file  Tobacco Use   Smoking status: Never   Smokeless tobacco: Never  Vaping Use   Vaping Use: Never used  Substance and Sexual Activity   Alcohol use: Yes    Alcohol/week: 0.0 standard drinks    Comment: occ; twice a month    Drug use: No   Sexual activity: Yes    Partners: Male     Comment: husband  Other Topics Concern   Not on file  Social History Narrative   Married 30 years October 2020. Daughter went to [place]. Son went to [place]. In 2020, daughter Corporate treasurer in [place], son moving to [place] and to work for [employer] in Engineer, mining. Has a Biomedical scientist in [place].       Hobbies: tennis, plays mahjong in womens group      Social Determinants of Health   Financial Resource Strain: Not on file  Food Insecurity: Not on file  Transportation Needs: Not on file  Physical Activity: Not on file  Stress: Not on file  Social Connections: Not on file  Intimate Partner Violence: Not on file    FAMILY HISTORY: Family History  Problem Relation Age of Onset   Arthritis Maternal Grandfather    Breast cancer Maternal Aunt    Prostate cancer Father    Elevated Lipids Father    Heart disease Father        mid 30s. had been remote smoker.    Hypertension Father    Diabetes Father        mid 42s   Dementia Father        vascular based   Elevated Lipids Mother    Diabetes Paternal Grandfather    Diabetes Maternal Grandmother    Healthy Brother    Colon cancer Neg Hx    Colon polyps Neg Hx    Esophageal cancer Neg Hx    Rectal cancer Neg Hx    Stomach cancer Neg Hx     ALLERGIES:  is allergic to codeine.  MEDICATIONS:  Current Outpatient Medications  Medication Sig Dispense Refill   dexamethasone (DECADRON) 4 MG tablet Take 2 tabs at the night before and 2 tab the morning of chemotherapy, every 3 weeks, by mouth x 6 cycles 36 tablet 6   Calcium Carb-Cholecalciferol (CALCIUM 600+D3 PO) Take 2 tablets by mouth in the morning.     Cholecalciferol (VITAMIN D3) 125 MCG (5000 UT) TABS Take 5,000 Units by mouth every Monday.     ciprofloxacin (CIPRO) 250 MG tablet Take 1 tablet (250 mg total) by mouth 2 (two) times daily. 6 tablet 0   ibuprofen (ADVIL) 800 MG tablet TAKE 1 TABLET BY MOUTH EVERY 8 HOURS AS NEEDED FOR AFTER SURGERY  ONLY 30 tablet 0   levothyroxine (SYNTHROID) 50 MCG tablet TAKE 1 TABLET BY MOUTH EVERY DAY (Patient taking differently: Take 50 mcg by mouth daily before breakfast.) 90 tablet 3   Omega-3 Fatty Acids (FISH OIL) 1000 MG CAPS Take 1,000 mg by mouth in the morning.     ondansetron (ZOFRAN) 8 MG tablet Take 1 tablet (8 mg total) by mouth every 8 (eight) hours as needed for refractory nausea / vomiting. 30 tablet 1   prochlorperazine (COMPAZINE) 10 MG tablet Take 1 tablet (10 mg total) by mouth every 6 (six) hours as needed (Nausea or vomiting). 30 tablet 1   senna-docusate (SENOKOT-S) 8.6-50 MG tablet Take 2 tablets by mouth at bedtime. For AFTER surgery, do not take if having diarrhea 30 tablet 0   traMADol (ULTRAM) 50 MG tablet TAKE 1 TABLET BY MOUTH EVERY 6 HOURS AS NEEDED FOR SEVERE PAIN*FOR AFTER SURGERY ONLY* 10 tablet 0   vitamin B-12 (CYANOCOBALAMIN) 1000 MCG tablet Take 1,000 mcg by mouth in the morning.     vitamin C (ASCORBIC ACID) 500 MG tablet Take 500 mg by mouth in the morning.     No current facility-administered medications for this visit.    REVIEW OF SYSTEMS:   Constitutional: Denies fevers, chills or abnormal night sweats Eyes: Denies blurriness of vision, double vision or watery eyes Ears, nose, mouth, throat, and face: Denies mucositis or sore throat Respiratory: Denies cough, dyspnea or wheezes Cardiovascular: Denies palpitation, chest discomfort or lower extremity swelling Skin: Denies abnormal skin rashes Lymphatics: Denies new lymphadenopathy or easy bruising Neurological:Denies numbness, tingling or new weaknesses Behavioral/Psych: Mood is stable, no new changes  All other systems were reviewed with the patient and are negative.  PHYSICAL EXAMINATION: ECOG PERFORMANCE  STATUS: 1 - Symptomatic but completely ambulatory  Vitals:   12/13/20 1339  BP: (!) 141/63  Pulse: (!) 56  Resp: 18  Temp: (!) 97.4 F (36.3 C)  SpO2: 100%   Filed Weights   12/13/20 1339   Weight: 159 lb 6.4 oz (72.3 kg)    GENERAL:alert, no distress and comfortable SKIN: skin color, texture, turgor are normal, no rashes or significant lesions EYES: normal, conjunctiva are pink and non-injected, sclera clear OROPHARYNX:no exudate, no erythema and lips, buccal mucosa, and tongue normal  NECK: supple, thyroid normal size, non-tender, without nodularity LYMPH:  no palpable lymphadenopathy in the cervical, axillary or inguinal LUNGS: clear to auscultation and percussion with normal breathing effort HEART: regular rate & rhythm and no murmurs and no lower extremity edema ABDOMEN:abdomen soft, non-tender and normal bowel sounds.  Noted well-healed surgical scar Musculoskeletal:no cyanosis of digits and no clubbing  PSYCH: alert & oriented x 3 with fluent speech NEURO: no focal motor/sensory deficits  LABORATORY DATA:  I have reviewed the data as listed Lab Results  Component Value Date   WBC 7.0 11/27/2020   HGB 14.1 11/27/2020   HCT 43.7 11/27/2020   MCV 94.8 11/27/2020   PLT 276 11/27/2020   Recent Labs    09/03/20 1633 11/27/20 1039  NA 142 143  K 3.8 4.2  CL 105 103  CO2 26 30  GLUCOSE 92 95  BUN 18 16  CREATININE 0.94 0.74  CALCIUM 9.7 10.0  GFRNONAA  --  >60  PROT 7.4  --   ALBUMIN 4.3  --   AST 14  --   ALT 17  --   ALKPHOS 60  --   BILITOT 0.3  --     RADIOGRAPHIC STUDIES: I have personally reviewed the radiological images as listed and agreed with the findings in the report. CT Abdomen Pelvis W Contrast  Result Date: 11/21/2020 CLINICAL DATA:  Right ovarian cancer restaging EXAM: CT ABDOMEN AND PELVIS WITH CONTRAST TECHNIQUE: Multidetector CT imaging of the abdomen and pelvis was performed using the standard protocol following bolus administration of intravenous contrast. CONTRAST:  17m OMNIPAQUE IOHEXOL 350 MG/ML SOLN COMPARISON:  Abdominal ultrasound, 09/23/2020 FINDINGS: Lower chest: No acute abnormality. Hepatobiliary: No solid liver  abnormality is seen. Polyp or adherent gallstone near the gallbladder fundus (series 2, image 33). No gallbladder wall thickening, or biliary dilatation. Pancreas: Unremarkable. No pancreatic ductal dilatation or surrounding inflammatory changes. Spleen: Normal in size without significant abnormality. Adrenals/Urinary Tract: Adrenal glands are unremarkable. Kidneys are normal, without renal calculi, solid lesion, or hydronephrosis. Bladder is unremarkable. Stomach/Bowel: Stomach is within normal limits. Appendix appears normal. No evidence of bowel wall thickening, distention, or inflammatory changes. Large burden of stool throughout the colon and rectum. Vascular/Lymphatic: Aortic atherosclerosis. No enlarged abdominal or pelvic lymph nodes. Reproductive: Calcified uterine fibroids. There is a large, cystic mass of the right ovary with peripheral nodular soft tissue components measuring at least 19.8 x 13.3 x 19.8 cm (series 2, image 67, series 4, image 44). Left ovary is normal. Other: No abdominal wall hernia or abnormality. No abdominopelvic ascites. Minimal stranding of the omentum in the ventral abdomen and minimal thickening of the peritoneum in the right lower quadrant in the right paracolic gutter without evidence of discrete omental or peritoneal nodularity (series 2, image 43). Musculoskeletal: No acute or significant osseous findings. IMPRESSION: 1. There is a large, cystic mass of the right ovary with peripheral nodular soft tissue components measuring at least 19.8 x 13.3  x 19.8 cm. Findings are consistent with primary ovarian malignancy. 2. Minimal stranding of the omentum in the ventral abdomen and minimal thickening of the peritoneum in the right lower quadrant in the right paracolic gutter without evidence of discrete omental or peritoneal nodularity. Findings are modestly suspicious for early peritoneal metastatic disease. 3. No lymphadenopathy or other evidence of metastatic disease in the  abdomen or pelvis. 4. Polyp or adherent gallstone near the gallbladder fundus, better assessed by prior ultrasound. 5. Large burden of stool throughout the colon and rectum. Aortic Atherosclerosis (ICD10-I70.0). Electronically Signed   By: Eddie Candle M.D.   On: 11/21/2020 12:32   LONG TERM MONITOR (3-14 DAYS)  Result Date: 11/26/2020 Patch Wear Time:  5 days and 17 hours (2022-07-07T12:52:11-398 to 2022-07-13T06:04:12-398) Patient had a min HR of 31 bpm, max HR of 102 bpm, and avg HR of 54 bpm. Predominant underlying rhythm was Sinus Rhythm. Isolated SVEs were rare (<1.0%), SVE Couplets were rare (<1.0%), and SVE Triplets were rare (<1.0%). Isolated VEs were rare (<1.0%), and no VE Couplets or VE Triplets were present. Nocturnal bradycardia present to a heart rate of 30 7:25 AM/1:15 AM/5:55 AM Occasional ventricular ectopics and supraventricular ectopics No significant arrhythmias

## 2020-12-15 NOTE — Assessment & Plan Note (Signed)
We reviewed the NCCN guidelines We discussed the role of chemotherapy. The intent is of curative intent.  We discussed some of the risks, benefits, side-effects of carboplatin & Taxol. Treatment is intravenous, every 3 weeks x 6 cycles  Some of the short term side-effects included, though not limited to, including weight loss, life threatening infections, risk of allergic reactions, need for transfusions of blood products, nausea, vomiting, change in bowel habits, loss of hair, admission to hospital for various reasons, and risks of death.   Long term side-effects are also discussed including risks of infertility, permanent damage to nerve function, hearing loss, chronic fatigue, kidney damage with possibility needing hemodialysis, and rare secondary malignancy including bone marrow disorders.  The patient is aware that the response rates discussed earlier is not guaranteed.  After a long discussion, patient made an informed decision to proceed with the prescribed plan of care.   Patient education material was dispensed. We discussed premedication with dexamethasone before chemotherapy. I think it is reasonable to start her treatment after Labor Day.  She has plan a nice family vacation She does not need venous access We discussed cranial prosthesis versus hair preservation

## 2020-12-15 NOTE — Assessment & Plan Note (Signed)
At the time of dictation, urinalysis and urine culture confirmed urinary tract infection Initially, I called prescription of amoxicillin but has to change it to ciprofloxacin due to sensitivity I reviewed expected side effects with her

## 2020-12-15 NOTE — Assessment & Plan Note (Signed)
We discussed importance of laxative therapy 

## 2020-12-16 ENCOUNTER — Telehealth: Payer: Self-pay

## 2020-12-16 MED ORDER — VITAMIN D (ERGOCALCIFEROL) 1.25 MG (50000 UNIT) PO CAPS: 50000.0000 [IU] | ORAL_CAPSULE | ORAL | 0 refills | Status: DC

## 2020-12-16 NOTE — Telephone Encounter (Signed)
The sooner I know about Dignicap the better, we would have to redo her schedules I prefer patients to get Booster next month Do not start shingles vaccination now; wait until she finish everything

## 2020-12-16 NOTE — Telephone Encounter (Signed)
Called and given below message. She verbalized understanding. She is feeling better. She is taking ibuprofen for abdominal cramping/pressure. She had a little blood on tissue and on her panties last night. None today. Denies burning with urination.  She is still thinking about the dignicap.  She is going on a cruise 8/22 and asking if she should go on and get a covid booster?  She is asking for your thoughts on Shingles vaccine?

## 2020-12-16 NOTE — Telephone Encounter (Signed)
Called and given below message. She verbalized understanding. 

## 2020-12-16 NOTE — Telephone Encounter (Signed)
-----   Message from [name], MD sent at 12/16/2020  8:40 AM EDT ----- I called in antibiotics for her yesterday Can you call her after lunch and see how she is doing?

## 2020-12-17 ENCOUNTER — Ambulatory Visit: Payer: BC Managed Care – PPO | Admitting: Family Medicine

## 2020-12-17 ENCOUNTER — Telehealth: Payer: Self-pay

## 2020-12-17 NOTE — Telephone Encounter (Signed)
She called and left a message. She would like to do the dignicap with treatment. She almost finished with the antibiotics and still having the aching in her legs.

## 2020-12-17 NOTE — Telephone Encounter (Signed)
I sent staff msg to change her appt

## 2020-12-19 ENCOUNTER — Other Ambulatory Visit: Payer: BC Managed Care – PPO

## 2020-12-19 ENCOUNTER — Telehealth: Payer: Self-pay

## 2020-12-19 DIAGNOSIS — G8918 Other acute postprocedural pain: Secondary | ICD-10-CM

## 2020-12-19 NOTE — Telephone Encounter (Signed)
Returning Brooke Khan's call about pressure in her pelvis. Patient states she saw Dr. Alvy Bimler last week and she checked her urine. Per patient she was told she had a bladder infection. She was prescribed cipro x3 days. She is still having that feeling of pressure when sitting but the pain has gone away. She denies N/V, fevers chills or pain. She reports not having regular bowel movements. Encouraged patient to begin taking miralax and ensure she is drinking plenty of fluids.  Lab appointment made for 8/12 at 09:15 to recheck her urine. Patient is in agreement of this. She will call with any questions or concerns.

## 2020-12-20 ENCOUNTER — Telehealth: Payer: Self-pay | Admitting: *Deleted

## 2020-12-20 ENCOUNTER — Telehealth: Payer: Self-pay | Admitting: Oncology

## 2020-12-20 ENCOUNTER — Other Ambulatory Visit: Payer: Self-pay

## 2020-12-20 ENCOUNTER — Encounter: Payer: Self-pay | Admitting: Hematology and Oncology

## 2020-12-20 ENCOUNTER — Inpatient Hospital Stay: Payer: BC Managed Care – PPO

## 2020-12-20 DIAGNOSIS — E785 Hyperlipidemia, unspecified: Secondary | ICD-10-CM | POA: Diagnosis not present

## 2020-12-20 DIAGNOSIS — Z90722 Acquired absence of ovaries, bilateral: Secondary | ICD-10-CM | POA: Diagnosis not present

## 2020-12-20 DIAGNOSIS — R011 Cardiac murmur, unspecified: Secondary | ICD-10-CM | POA: Diagnosis not present

## 2020-12-20 DIAGNOSIS — Z90710 Acquired absence of both cervix and uterus: Secondary | ICD-10-CM | POA: Diagnosis not present

## 2020-12-20 DIAGNOSIS — K5900 Constipation, unspecified: Secondary | ICD-10-CM | POA: Diagnosis not present

## 2020-12-20 DIAGNOSIS — G8918 Other acute postprocedural pain: Secondary | ICD-10-CM

## 2020-12-20 DIAGNOSIS — C561 Malignant neoplasm of right ovary: Secondary | ICD-10-CM | POA: Diagnosis not present

## 2020-12-20 DIAGNOSIS — N390 Urinary tract infection, site not specified: Secondary | ICD-10-CM | POA: Diagnosis not present

## 2020-12-20 DIAGNOSIS — E559 Vitamin D deficiency, unspecified: Secondary | ICD-10-CM | POA: Diagnosis not present

## 2020-12-20 DIAGNOSIS — Z923 Personal history of irradiation: Secondary | ICD-10-CM | POA: Diagnosis not present

## 2020-12-20 DIAGNOSIS — Z9221 Personal history of antineoplastic chemotherapy: Secondary | ICD-10-CM | POA: Diagnosis not present

## 2020-12-20 DIAGNOSIS — I700 Atherosclerosis of aorta: Secondary | ICD-10-CM | POA: Diagnosis not present

## 2020-12-20 DIAGNOSIS — E039 Hypothyroidism, unspecified: Secondary | ICD-10-CM | POA: Diagnosis not present

## 2020-12-20 LAB — URINALYSIS, COMPLETE (UACMP) WITH MICROSCOPIC
Bilirubin Urine: NEGATIVE
Glucose, UA: NEGATIVE mg/dL
Hgb urine dipstick: NEGATIVE
Ketones, ur: NEGATIVE mg/dL
Nitrite: NEGATIVE
Protein, ur: NEGATIVE mg/dL
Specific Gravity, Urine: 1.01 (ref 1.005–1.030)
pH: 7 (ref 5.0–8.0)

## 2020-12-20 NOTE — Telephone Encounter (Signed)
Called Brooke Khan and went over her appointments on 01/15/21 and 02/04/21.  Advised her we will get her a new printed schedule at her next appointment.  She verbalized understanding and agreement.

## 2020-12-20 NOTE — Telephone Encounter (Signed)
PC to patient, informed her of today's U/A results, showing white blood cells in urine with rare bacteria, will await culture results.  Patient instructed to take OTC AZO for any UTI symptoms per M. Cross NP.  Patient states she is having some lower abdominal discomfort.  Instructed patient to call with any worsening symptoms.  She verbalizes understanding.

## 2020-12-21 LAB — URINE CULTURE: Culture: 40000 — AB

## 2020-12-23 ENCOUNTER — Other Ambulatory Visit: Payer: Self-pay | Admitting: *Deleted

## 2020-12-23 ENCOUNTER — Other Ambulatory Visit: Payer: Self-pay

## 2020-12-23 ENCOUNTER — Telehealth: Payer: Self-pay | Admitting: *Deleted

## 2020-12-23 ENCOUNTER — Inpatient Hospital Stay: Payer: BC Managed Care – PPO

## 2020-12-23 ENCOUNTER — Ambulatory Visit: Payer: BC Managed Care – PPO | Admitting: Cardiology

## 2020-12-23 ENCOUNTER — Encounter: Payer: Self-pay | Admitting: Gastroenterology

## 2020-12-23 DIAGNOSIS — E039 Hypothyroidism, unspecified: Secondary | ICD-10-CM | POA: Diagnosis not present

## 2020-12-23 DIAGNOSIS — N390 Urinary tract infection, site not specified: Secondary | ICD-10-CM | POA: Diagnosis not present

## 2020-12-23 DIAGNOSIS — G8918 Other acute postprocedural pain: Secondary | ICD-10-CM

## 2020-12-23 DIAGNOSIS — E785 Hyperlipidemia, unspecified: Secondary | ICD-10-CM | POA: Diagnosis not present

## 2020-12-23 DIAGNOSIS — Z90722 Acquired absence of ovaries, bilateral: Secondary | ICD-10-CM | POA: Diagnosis not present

## 2020-12-23 DIAGNOSIS — R011 Cardiac murmur, unspecified: Secondary | ICD-10-CM | POA: Diagnosis not present

## 2020-12-23 DIAGNOSIS — Z9221 Personal history of antineoplastic chemotherapy: Secondary | ICD-10-CM | POA: Diagnosis not present

## 2020-12-23 DIAGNOSIS — K5900 Constipation, unspecified: Secondary | ICD-10-CM | POA: Diagnosis not present

## 2020-12-23 DIAGNOSIS — Z923 Personal history of irradiation: Secondary | ICD-10-CM | POA: Diagnosis not present

## 2020-12-23 DIAGNOSIS — I700 Atherosclerosis of aorta: Secondary | ICD-10-CM | POA: Diagnosis not present

## 2020-12-23 DIAGNOSIS — Z90710 Acquired absence of both cervix and uterus: Secondary | ICD-10-CM | POA: Diagnosis not present

## 2020-12-23 DIAGNOSIS — C561 Malignant neoplasm of right ovary: Secondary | ICD-10-CM

## 2020-12-23 DIAGNOSIS — E559 Vitamin D deficiency, unspecified: Secondary | ICD-10-CM | POA: Diagnosis not present

## 2020-12-23 LAB — URINALYSIS, COMPLETE (UACMP) WITH MICROSCOPIC
Bacteria, UA: NONE SEEN
Bilirubin Urine: NEGATIVE
Glucose, UA: NEGATIVE mg/dL
Hgb urine dipstick: NEGATIVE
Ketones, ur: NEGATIVE mg/dL
Nitrite: NEGATIVE
Protein, ur: NEGATIVE mg/dL
Specific Gravity, Urine: 1.004 — ABNORMAL LOW (ref 1.005–1.030)
pH: 6 (ref 5.0–8.0)

## 2020-12-23 LAB — CBC WITH DIFFERENTIAL (CANCER CENTER ONLY)
Abs Immature Granulocytes: 0.01 10*3/uL (ref 0.00–0.07)
Basophils Absolute: 0.1 10*3/uL (ref 0.0–0.1)
Basophils Relative: 1 %
Eosinophils Absolute: 0.4 10*3/uL (ref 0.0–0.5)
Eosinophils Relative: 5 %
HCT: 42.2 % (ref 36.0–46.0)
Hemoglobin: 14.2 g/dL (ref 12.0–15.0)
Immature Granulocytes: 0 %
Lymphocytes Relative: 33 %
Lymphs Abs: 2.6 10*3/uL (ref 0.7–4.0)
MCH: 30.6 pg (ref 26.0–34.0)
MCHC: 33.6 g/dL (ref 30.0–36.0)
MCV: 90.9 fL (ref 80.0–100.0)
Monocytes Absolute: 0.5 10*3/uL (ref 0.1–1.0)
Monocytes Relative: 6 %
Neutro Abs: 4.4 10*3/uL (ref 1.7–7.7)
Neutrophils Relative %: 55 %
Platelet Count: 377 10*3/uL (ref 150–400)
RBC: 4.64 MIL/uL (ref 3.87–5.11)
RDW: 12.8 % (ref 11.5–15.5)
WBC Count: 7.9 10*3/uL (ref 4.0–10.5)
nRBC: 0 % (ref 0.0–0.2)

## 2020-12-23 LAB — CMP (CANCER CENTER ONLY)
ALT: 38 U/L (ref 0–44)
AST: 17 U/L (ref 15–41)
Albumin: 4.3 g/dL (ref 3.5–5.0)
Alkaline Phosphatase: 78 U/L (ref 38–126)
Anion gap: 11 (ref 5–15)
BUN: 20 mg/dL (ref 6–20)
CO2: 25 mmol/L (ref 22–32)
Calcium: 10 mg/dL (ref 8.9–10.3)
Chloride: 106 mmol/L (ref 98–111)
Creatinine: 0.81 mg/dL (ref 0.44–1.00)
GFR, Estimated: >60 mL/min (ref >60)
Glucose, Bld: 95 mg/dL (ref 70–99)
Potassium: 4.1 mmol/L (ref 3.5–5.1)
Sodium: 142 mmol/L (ref 135–145)
Total Bilirubin: 0.2 mg/dL — ABNORMAL LOW (ref 0.3–1.2)
Total Protein: 7.8 g/dL (ref 6.5–8.1)

## 2020-12-23 NOTE — Telephone Encounter (Signed)
Ms. Awwad called back and requested to obtain a urine sample today. Heywood Iles NP consulted. Pt. Scheduled lab appt for [place] and culture today at 2:30. Appt confirmed w/ patient

## 2020-12-23 NOTE — Telephone Encounter (Signed)
Results of UA Discussed w/ patient. Culture pending. Pt to f/u tomorrow in clinic

## 2020-12-23 NOTE — Telephone Encounter (Signed)
Ms. Brooke Khan called to discuss the results of her UA. She reports that her symptoms of pressure are improving and she did not have to take any Ibuprofen last night. If her symptoms become worse, she can call back, but she has an appt scheduled tomorrow and will plan to f/u at that time. We can also obtain another urine sample if necessary while she is here for her postop appt tomorrow. She is agreeable to the plan of care.

## 2020-12-24 ENCOUNTER — Inpatient Hospital Stay: Payer: BC Managed Care – PPO

## 2020-12-24 ENCOUNTER — Inpatient Hospital Stay (HOSPITAL_BASED_OUTPATIENT_CLINIC_OR_DEPARTMENT_OTHER): Payer: BC Managed Care – PPO | Admitting: Gynecologic Oncology

## 2020-12-24 VITALS — BP 150/78 | HR 73 | Temp 98.5°F | Resp 18 | Ht 62.0 in | Wt 151.2 lb

## 2020-12-24 DIAGNOSIS — G8918 Other acute postprocedural pain: Secondary | ICD-10-CM

## 2020-12-24 DIAGNOSIS — R102 Pelvic and perineal pain: Secondary | ICD-10-CM

## 2020-12-24 DIAGNOSIS — Z90722 Acquired absence of ovaries, bilateral: Secondary | ICD-10-CM

## 2020-12-24 DIAGNOSIS — C561 Malignant neoplasm of right ovary: Secondary | ICD-10-CM

## 2020-12-24 DIAGNOSIS — Z90710 Acquired absence of both cervix and uterus: Secondary | ICD-10-CM

## 2020-12-24 LAB — URINE CULTURE: Culture: <10000 — AB

## 2020-12-24 NOTE — Patient Instructions (Signed)
You are healing well from surgery. There was no sign of infection on your urine culture from today. If regards to the constipation, I would recommend continuing use of metamucil and you can add in miralax, senna tea (smooth move tea), senna tablets, or stool softeners if needed.   If you develop any new symptoms such as vaginal discharge or bleeding, new abdominal/pelvic pain, fever, chills, or any new symptoms please call our office.   Continue to refrain from intercourse for the full 8 weeks from surgery.

## 2020-12-24 NOTE — Progress Notes (Signed)
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 Everitt Amber on 12/03/2020. She presents today to the office for post-operative follow up prior to leaving town for a cruise and to evaluate pelvic pressure. She reports developing pelvic pressure post-operatively. This was discussed at her first visit with Dr. Alvy Bimler, who tested her urine for infection. The urine culture resulted 20,000 colonies of klebsiella oxytoca and she was placed on Cipro orally for three days. She states she did not see a difference in the pressure after the completion of the antibiotics. She underwent a repeat urine culture on 12/20/2020 which resulted 40,000 colonies of lactobacillus species. A repeat culture was obtained given the high chance the previous was contaminated. Urine culture from 12/23/20 showing <10,000 colonies with insignificant growth.   She has also been working on keeping her bowels regular. She has been taking metamucil nightly. She is having a soft bowel movement every other day (feels her routine was daily previously) and has had issues with constipation in the past pre-operatively. She denies fever, chills. Tolerating her diet with no nausea or emesis. She feels she is emptying her bladder with dysuria or hematuria. No vaginal discharge or bleeding reported. Abdominal incision healing without issue. No significant abdominal pain reported. Intermittent abdominal cramping-like discomfort felt that resolves.   Exam: Alert, oriented, in no acute distress. Lungs clear. Heart regular in rate and rhythm. Abdominal incisions healing well with no erythema or drainage noted. On speculum exam, vaginal cuff incision intact with sutures present. No erythema or tenderness at the cuff. Minimal  amount of white drainage noted at the cuff. Bimanual confirms intact vaginal cuff with no pelvic fluid collections appreciated.   Plan: Reportable signs and symptoms reviewed. No evidence of urinary tract infection, vaginal cuff cellulitis, or pelvic fluid collection on exam today. Discussed bowel regimen in detail. She is to follow up as planned on January 14, 2021 or sooner if needed. She will have access to reach out while on the cruise for any questions or concerns.

## 2020-12-25 ENCOUNTER — Telehealth: Payer: Self-pay | Admitting: Gynecologic Oncology

## 2020-12-25 ENCOUNTER — Other Ambulatory Visit: Payer: Self-pay | Admitting: Gynecologic Oncology

## 2020-12-25 DIAGNOSIS — R102 Pelvic and perineal pain: Secondary | ICD-10-CM

## 2020-12-25 MED ORDER — CLINDAMYCIN HCL 300 MG PO CAPS: 300.0000 mg | ORAL_CAPSULE | Freq: Three times a day (TID) | ORAL | 0 refills | Status: DC

## 2020-12-25 NOTE — Telephone Encounter (Signed)
Duplicate

## 2020-12-25 NOTE — Telephone Encounter (Signed)
Called patient to follow up. She states the feeling of pelvic pressure has not changed since her visit yesterday. Since she is going to be on a cruise starting next week with limited access to medical care/prescriptions if needed, a prescription for clindamycin will be sent in for the patient to take with her on her trip if needed. Discussed the symptoms that would warrant initiation of the antibiotic. Her current symptom is pelvic pressure. She had a small amount of discharge at the vaginal cuff on examination on 12/24/20 with no erythema, tenderness on exam. This is felt to be related to reactive changes from the suture at the cuff. Dr. Denman George is agreeable with the plan. The patient will also be able to send mychart messages with questions or concerns. No concerns voiced at the end of the call. Patient is advised to call for any needs.

## 2020-12-26 ENCOUNTER — Inpatient Hospital Stay (HOSPITAL_BASED_OUTPATIENT_CLINIC_OR_DEPARTMENT_OTHER): Payer: BC Managed Care – PPO | Admitting: Genetic Counselor

## 2020-12-26 ENCOUNTER — Other Ambulatory Visit: Payer: Self-pay

## 2020-12-26 ENCOUNTER — Encounter: Payer: Self-pay | Admitting: Genetic Counselor

## 2020-12-26 ENCOUNTER — Other Ambulatory Visit: Payer: Self-pay | Admitting: Genetic Counselor

## 2020-12-26 ENCOUNTER — Inpatient Hospital Stay: Payer: BC Managed Care – PPO

## 2020-12-26 DIAGNOSIS — Z8042 Family history of malignant neoplasm of prostate: Secondary | ICD-10-CM | POA: Diagnosis not present

## 2020-12-26 DIAGNOSIS — C561 Malignant neoplasm of right ovary: Secondary | ICD-10-CM | POA: Diagnosis not present

## 2020-12-26 DIAGNOSIS — Z86010 Personal history of colonic polyps: Secondary | ICD-10-CM | POA: Diagnosis not present

## 2020-12-26 DIAGNOSIS — Z803 Family history of malignant neoplasm of breast: Secondary | ICD-10-CM

## 2020-12-26 HISTORY — DX: Family history of malignant neoplasm of breast: Z80.3

## 2020-12-26 HISTORY — DX: Family history of malignant neoplasm of prostate: Z80.42

## 2020-12-26 LAB — GENETIC SCREENING ORDER

## 2020-12-26 NOTE — Progress Notes (Signed)
REFERRING PROVIDER: Dorothyann Gibbs, NP [place],  [place] [id number]   PRIMARY PROVIDER:  [name], MD  PRIMARY REASON FOR VISIT:  1. Ovarian cancer, right ([place])   2. Family history of breast cancer   3. Family history of prostate cancer     HISTORY OF PRESENT ILLNESS:   Brooke Khan, a 55 y.o. female, was seen for a [place] cancer genetics consultation at the request of Brooke John, NP due to a personal history of cancer.  Brooke Khan presents to clinic today to discuss the possibility of a hereditary predisposition to cancer, to discuss genetic testing, and to further clarify her future cancer risks, as well as potential cancer risks for family members.   In July 2022, at the age of 1, Brooke Khan was diagnosed with enodmetrioid carcinoma of the right ovary.  The treatment plan included total abdominal hysterectomy with bilateral salpingo-oophorectomy with plans for chemotherapy beginning in September.   CANCER HISTORY:  Oncology History Overview Note  Endometrioid cancer FIGO grade 3   Ovarian cancer, right ([place])   Initial Diagnosis   Ovarian cancer, right ([place])   11/20/2020 Imaging   CT scan of abdomen and pelvis  1. There is a large, cystic mass of the right ovary with peripheral nodular soft tissue components measuring at least 19.8 x 13.3 x 19.8 cm. Findings are consistent with primary ovarian malignancy. 2. Minimal stranding of the omentum in the ventral abdomen and minimal thickening of the peritoneum in the right lower quadrant in the right paracolic gutter without evidence of discrete omental or peritoneal nodularity. Findings are modestly suspicious for early peritoneal metastatic disease. 3. No lymphadenopathy or other evidence of metastatic disease in the abdomen or pelvis. 4. Polyp or adherent gallstone near the gallbladder fundus, better assessed by prior ultrasound. 5. Large burden of stool throughout the colon and rectum.   Aortic  Atherosclerosis (ICD10-I70.0).   12/03/2020 Pathology Results   SURGICAL PATHOLOGY  CASE: [id number]  PATIENT: Brooke Khan  Surgical Pathology Report   Clinical History: Ovarian mass (crm)    FINAL MICROSCOPIC DIAGNOSIS:   A. UTERUS, CERVIX, BILATERAL FALLOPIAN TUBES AND OVARIES:  Ovary, right  -  Endometrioid carcinoma, FIGO grade 3  -  See oncology table and comment below   Ovary, left:  -  No carcinoma identified   Bilateral Fallopian tubes:  -  No carcinoma identified   Uterus:  -  No carcinoma identified   Cervix:  -  No carcinoma identified   B. LYMPH NODES, RIGHT PARAAORTIC, RESECTION:  -  No carcinoma identified in six lymph nodes (0/6)   C. LYMPH NODES, LEFT PARAAORTIC, RESECTION:  -  No carcinoma identified in five lymph nodes (0/5)   D. LYMPH NODES, RIGHT PELVIC, RESECTION:  -  No carcinoma identified in five lymph nodes (0/5)   E. LYMPH NODES, LEFT PELVIC, RESECTION:  -  No carcinoma identified in three lymph nodes (0/3)   F. OMENTUM, OMENTECTOMY:  -  No carcinoma identified   G. PERITONEUM, ANTERIOR PELVIC, BIOPSY:  -  No carcinoma identified   H. PERITONEUM, RIGHT PELVIC, BIOPSY:  -  No carcinoma identified   I. PERITONEUM, LEFT PELVIC, BIOPSY:  -  No carcinoma identified   J. PERITONEUM, POSTERIOR PELVIC, BIOPSY:  -  No carcinoma identified   K. PERITONEUM, RIGHT ABDOMINAL, BIOPSY:  -  No carcinoma identified   L. PERITONEUM, LEFT ABDOMINAL, BIOPSY:  -  No carcinoma identified  ONCOLOGY TABLE:   OVARY or FALLOPIAN TUBE or PRIMARY PERITONEUM: Resection   Procedure: Hysterectomy with bilateral salpingo-oophorectomy  Specimen Integrity: Intact  Tumor Site: Ovary, right  Tumor Size: 1.5 cm  Histologic Type: Endometrioid carcinoma  Histologic Grade: FIGO grade 3  Ovarian Surface Involvement: Not identified  Fallopian Tube Surface Involvement: Not identified  Other Tissue/ Organ Involvement: Not applicable  Largest  Extrapelvic Peritoneal Focus: Not applicable  Peritoneal/Ascitic Fluid Involvement: Malignant cells not identified  Chemotherapy Response Score (CRS): Not applicable, no known presurgical therapy  Regional Lymph Nodes:       Number of Nodes with Metastasis Greater than 10 mm: 0       Number of Nodes with Metastasis 10 mm or Less (excludes isolated  tumor cells): 0       Number of Nodes with Isolated Tumor Cells (0.2 mm or less): 0       Number of Lymph Nodes Examined: 19  Distant Metastasis:       Distant Site(s) Involved: Not applicable  Pathologic Stage Classification (pTNM, AJCC 8th Edition): pT1a, pN0  Ancillary Studies: Can be performed upon request  Representative Tumor Block: A8  Comment(s): Brooke Khan reviewed the case and agrees with the above diagnosis.     12/03/2020 Surgery   Surgeon: Brooke Khan    Operation: Robotic-assisted laparoscopic total hysterectomy with bilateral salpingoophorectomy, omentectomy, pelvic and para-aortic lymphadnectomy.     Surgeon: Brooke Khan    Operative Findings:  : 20cm cystic mass, smooth. Attachments between inferior aspect of cystic mass and posterior pelvic peritoneum. Normal appearing omentum and nodes. Invasive malignancy on evaluation. No gross extra-ovarian disease. Clinical stage I disease. No gross residual disease.    12/11/2020 Cancer Staging   Staging form: Ovary, Fallopian Tube, and Primary Peritoneal Carcinoma, AJCC 8th Edition - Pathologic stage from 12/11/2020: FIGO Stage IA (pT1a, pN0, cM0) - Signed by Brooke Lark, MD on 12/11/2020 Stage prefix: Initial diagnosis   01/14/2021 -  Chemotherapy    Patient is on Treatment Plan: OVARIAN CARBOPLATIN (AUC 6) / PACLITAXEL (175) Q21D X 6 CYCLES         RISK FACTORS:  Menarche was at age 35.  First live birth at age 66.  OCP use for approximately 1 month.  Ovaries intact: no.  Hysterectomy: yes.  Menopausal status: postmenopausal.  HRT use: 0  years. Colonoscopy: yes;  most recent in 2017; f/u 5 years due to one tubular adenoma . Mammogram within the last year: yes. Number of breast biopsies: 0.  Past Medical History:  Diagnosis Date   Bradycardia    Chicken pox    Elevated blood pressure reading    Epigastric abdominal pain    Family history of breast cancer 12/26/2020   Family history of prostate cancer 12/26/2020   Heart murmur    since childhood- not always heard   Hyperglycemia    Hyperlipidemia    borderline- no meds   Hypothyroidism (acquired)    Lightheaded    Pre-diabetes    Vitamin D deficiency     Past Surgical History:  Procedure Laterality Date   CESAREAN SECTION     x2   cosmetic knee surgery     as child   LAPAROTOMY N/A 12/03/2020   Procedure: MINI LAPAROTOMY FOR CYST DECOMPRESSION;  Surgeon: Everitt Amber, MD;  Location: [facility];  Service: Gynecology;  Laterality: N/A;   ROBOTIC ASSISTED TOTAL HYSTERECTOMY WITH BILATERAL SALPINGO OOPHERECTOMY N/A 12/03/2020   Procedure: XI ROBOTIC ASSISTED TOTAL HYSTERECTOMY  WITH BILATERAL SALPINGO OOPHORECTOMY WITH PARA-AORTIC AND PELVIC LYMPHADNECTOMY AND OMENTECTOMY;  Surgeon: Everitt Amber, MD;  Location: [facility];  Service: Gynecology;  Laterality: N/A;    Social History   Socioeconomic History   Marital status: Married    Spouse name: Not on file   Number of children: Not on file   Years of education: Not on file   Highest education level: Not on file  Occupational History   Not on file  Tobacco Use   Smoking status: Never   Smokeless tobacco: Never  Vaping Use   Vaping Use: Never used  Substance and Sexual Activity   Alcohol use: Yes    Alcohol/week: 0.0 standard drinks    Comment: occ; twice a month    Drug use: No   Sexual activity: Yes    Partners: Male    Comment: husband  Other Topics Concern   Not on file  Social History Narrative   Married 30 years October 2020. Daughter went to [place]. Son went to [place]. In 2020, daughter Corporate treasurer  in [place], son moving to [place] and to work for [employer] in Engineer, mining. 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       Hobbies: tennis, plays mahjong in womens group      Social Determinants of Health   Financial Resource Strain: Not on file  Food Insecurity: Not on file  Transportation Needs: Not on file  Physical Activity: Not on file  Stress: Not on file  Social Connections: Not on file     FAMILY HISTORY:  We obtained a detailed, 4-generation family history.  Significant diagnoses are listed below: Family History  Problem Relation Age of Onset   Prostate cancer Father        dx 12s   Skin cancer Father        SCC, dx after 43   Breast cancer Maternal Aunt 63   Prostate cancer Maternal Uncle        dx 11s   Lymphoma Paternal Uncle        dx after 10   Cancer Cousin        unknown type; ?throat ?lymphoma d. 19    Ms. Gores is unaware of previous family history of genetic testing for hereditary cancer risks. There is no reported Ashkenazi Jewish ancestry. There is no known consanguinity.  GENETIC COUNSELING ASSESSMENT: Ms. Goold is a 55 y.o. female with a personal and family history of cancer which is somewhat suggestive of a hereditary cancer syndrome and predisposition to cancer given her diagnosis of ovarian cancer and the family history of related cancers. We, therefore, discussed and recommended the following at today's visit.   DISCUSSION: We discussed that 5 - 10% of cancer is hereditary, with most cases of hereditary ovarian associated with mutations in BRCA1/2.  There are other genes that can be associated with hereditary ovarian cancer syndromes.  These include but are not limited to RAD51C, RAD51D, BRIP1, and ATM.  We discussed that testing is beneficial for several reasons including knowing how to follow individuals after completing their treatment, identifying whether potential treatment options, such as PARP inhibitors, would be beneficial, and  understanding if other family members could be at risk for cancer and allowing them to undergo genetic testing.   We reviewed the characteristics, features and inheritance patterns of hereditary cancer syndromes. We also discussed genetic testing, including the appropriate family members to test, the process of testing, insurance coverage and  turn-around-time for results. We discussed the implications of a negative, positive, carrier and/or variant of uncertain significant result. We recommended Ms. Basulto pursue genetic testing for a panel that includes genes associated with ovarian, breast, and prostate, and well as homologous recombination deficiency (HRD) testing on the tumor.   The [facility] gene panel offered by [employer] includes sequencing and deletion/duplication testing of the following 48 genes: APC, ATM, AXIN2, BAP1, BARD1, BMPR1A, BRCA1, BRCA2, BRIP1, CHD1, CDK4, CDKN2A(p16 and p14ARF), CHEK2, CTNNA1, EGFR, EPCAM, FH, FLCN, GREM1, HOXB13, MEN1, MET, MITF , MLH1, MSH2, MSH3, MSH6, MUTYH, NTHL1, PALB2, PMS2, POLD1, POLE, PTEN, RAD51C, RAD51D, RET, SDHA, SDHB, SDHC, SDHD, SMAD4, STK11,TERT, TP53, TSC1, TSC2, and VHL.  Unless otherwise specified, all coding regions and flanking non-coding regions are analyzed for sequence variation. Analysis of flanking intronic regions typically do not extend more than 20 bp before and 10 bp after each exon, though the exact region may be adjusted based on the presence of either potentially significant variants or highly repetitive sequences. Coding regions and proximal promoter regions near the transcription start sites are analyzed for large deletions or duplications. Specific genes are tested only for sequence and/or CNVs within limited regions. Limited clinically relevant regions are included for EGFR (sequencing and CNV analysis of exons 18-21),RET (sequencing and CNV analysis of exons 5, 8, 10, 11, and 13-16), and MITF (sequencing of position  c.952). Only CNV analysis of the last two exons of EPCAM is performed. CNV analysis of GREM1 includes the upstream region overlapping the adjacent gene SCG5. MSH3 exon 1 contains a long polyalanine repeat that can interfere with variant calling; therefore, MSH3 analysis excludes c.121 to c.237. Only sequence analysis of the exons encompassing the exonuclease domains of these genes is performed (POLD1 c.841 to c.1686, POLE c.802 to c.1473). Limited promoter regions in selected genes undergo sequence analysis including TERT (c.-71 to c.-1), and APC Promoter 1B (c.-195 to c.-190 and c.-125 ([id number]).    We discussed that genetic testing through [facility] will test for hereditary mutations that could explain her diagnosis of cancer and inform cancer management/prevention strategies for family members.  However, homologous recombination testing (HRD) is genetic testing performed on the tumor/biopsy that can determine genetic changes that could influence her management, such as eligibility for targeted therapies.  [facility] CDx is a next generation sequencing-based in vitro diagnostic test that assesses the qualitative detection and classification of single nucleotide variants, insertions and deletions, and large rearrangement variants in protein coding regions and intron/exon boundaries of the BRCA1 and BRCA2 genes and the determination of Genomic Instability Score (GIS) which is an algorithmic measurement of Loss of Heterozygosity (LOH), Telomeric Allelic Imbalance (TAI), and Large-scale State Transitions (LST).   Based on Ms. Kahler's personal history of cancer, she meets medical criteria for genetic testing. Despite that she meets criteria, she may still have an out of pocket cost.   PLAN: After considering the risks, benefits, and limitations, Ms. Busser provided informed consent to pursue genetic testing and the blood sample was sent to [facility] for analysis of the [facility] (germline panel  testing) and [facility] (HRD testing). Results should be available within approximately 3-4 weeks' time, at which point they will be disclosed by telephone to Ms. Crumbley, as will any additional recommendations warranted by these results. Ms. Ludden will receive a summary of her genetic counseling visit and a copy of her results once available. This information will also be available in Epic.   Lastly, we encouraged Ms. Courser to  remain in contact with cancer genetics annually so that we can continuously update the family history and inform her of any changes in cancer genetics and testing that may be of benefit for this family.   Ms. Liles questions were answered to her satisfaction today. Our contact information was provided should additional questions or concerns arise. Thank you for the referral and allowing [place] to share in the care of your patient.   Rochella Benner M. Joette Catching, [place], [facility] Genetic Counselor [email or web address] (P) [phone]  The patient was seen for a total of 30 minutes in face-to-face genetic counseling.  The patient was seen alone.  Drs. Magrinat, Lindi Adie and/or Burr Medico were available to discuss this case as needed.    _______________________________________________________________________ For Office Staff:  Number of people involved in session: 1 Was an Intern/ student involved with case: no

## 2021-01-01 ENCOUNTER — Ambulatory Visit: Payer: BC Managed Care – PPO | Admitting: Gynecologic Oncology

## 2021-01-03 ENCOUNTER — Encounter: Payer: Self-pay | Admitting: Gynecologic Oncology

## 2021-01-10 MED FILL — Fosaprepitant Dimeglumine For IV Infusion 150 MG (Base Eq): INTRAVENOUS | Qty: 5 | Status: AC

## 2021-01-10 MED FILL — Dexamethasone Sodium Phosphate Inj 100 MG/10ML: INTRAMUSCULAR | Qty: 1 | Status: AC

## 2021-01-14 ENCOUNTER — Encounter: Payer: Self-pay | Admitting: Oncology

## 2021-01-14 ENCOUNTER — Encounter: Payer: Self-pay | Admitting: Hematology and Oncology

## 2021-01-14 ENCOUNTER — Inpatient Hospital Stay: Payer: BC Managed Care – PPO

## 2021-01-14 ENCOUNTER — Ambulatory Visit: Payer: BC Managed Care – PPO | Admitting: Gynecologic Oncology

## 2021-01-14 ENCOUNTER — Inpatient Hospital Stay (HOSPITAL_BASED_OUTPATIENT_CLINIC_OR_DEPARTMENT_OTHER): Payer: BC Managed Care – PPO | Admitting: Gynecologic Oncology

## 2021-01-14 ENCOUNTER — Inpatient Hospital Stay: Payer: BC Managed Care – PPO | Attending: Gynecologic Oncology | Admitting: Hematology and Oncology

## 2021-01-14 ENCOUNTER — Other Ambulatory Visit: Payer: Self-pay

## 2021-01-14 VITALS — BP 156/76 | HR 82 | Temp 97.8°F | Resp 18 | Wt 158.2 lb

## 2021-01-14 DIAGNOSIS — Z803 Family history of malignant neoplasm of breast: Secondary | ICD-10-CM | POA: Insufficient documentation

## 2021-01-14 DIAGNOSIS — K5909 Other constipation: Secondary | ICD-10-CM

## 2021-01-14 DIAGNOSIS — N3000 Acute cystitis without hematuria: Secondary | ICD-10-CM | POA: Diagnosis not present

## 2021-01-14 DIAGNOSIS — Z5111 Encounter for antineoplastic chemotherapy: Secondary | ICD-10-CM | POA: Insufficient documentation

## 2021-01-14 DIAGNOSIS — Z7189 Other specified counseling: Secondary | ICD-10-CM

## 2021-01-14 DIAGNOSIS — E559 Vitamin D deficiency, unspecified: Secondary | ICD-10-CM | POA: Diagnosis not present

## 2021-01-14 DIAGNOSIS — Z90722 Acquired absence of ovaries, bilateral: Secondary | ICD-10-CM

## 2021-01-14 DIAGNOSIS — Z79899 Other long term (current) drug therapy: Secondary | ICD-10-CM | POA: Insufficient documentation

## 2021-01-14 DIAGNOSIS — C561 Malignant neoplasm of right ovary: Secondary | ICD-10-CM | POA: Diagnosis not present

## 2021-01-14 DIAGNOSIS — Z8042 Family history of malignant neoplasm of prostate: Secondary | ICD-10-CM | POA: Diagnosis not present

## 2021-01-14 DIAGNOSIS — Z809 Family history of malignant neoplasm, unspecified: Secondary | ICD-10-CM | POA: Insufficient documentation

## 2021-01-14 DIAGNOSIS — Z90710 Acquired absence of both cervix and uterus: Secondary | ICD-10-CM

## 2021-01-14 DIAGNOSIS — R011 Cardiac murmur, unspecified: Secondary | ICD-10-CM | POA: Insufficient documentation

## 2021-01-14 DIAGNOSIS — E039 Hypothyroidism, unspecified: Secondary | ICD-10-CM | POA: Diagnosis not present

## 2021-01-14 LAB — CMP (CANCER CENTER ONLY)
ALT: 42 U/L (ref 0–44)
AST: 22 U/L (ref 15–41)
Albumin: 4.2 g/dL (ref 3.5–5.0)
Alkaline Phosphatase: 89 U/L (ref 38–126)
Anion gap: 12 (ref 5–15)
BUN: 16 mg/dL (ref 6–20)
CO2: 27 mmol/L (ref 22–32)
Calcium: 10.4 mg/dL — ABNORMAL HIGH (ref 8.9–10.3)
Chloride: 106 mmol/L (ref 98–111)
Creatinine: 0.84 mg/dL (ref 0.44–1.00)
GFR, Estimated: >60 mL/min (ref >60)
Glucose, Bld: 93 mg/dL (ref 70–99)
Potassium: 4.2 mmol/L (ref 3.5–5.1)
Sodium: 145 mmol/L (ref 135–145)
Total Bilirubin: 0.3 mg/dL (ref 0.3–1.2)
Total Protein: 8 g/dL (ref 6.5–8.1)

## 2021-01-14 LAB — CBC WITH DIFFERENTIAL (CANCER CENTER ONLY)
Abs Immature Granulocytes: 0.03 10*3/uL (ref 0.00–0.07)
Basophils Absolute: 0 10*3/uL (ref 0.0–0.1)
Basophils Relative: 0 %
Eosinophils Absolute: 0.3 10*3/uL (ref 0.0–0.5)
Eosinophils Relative: 4 %
HCT: 42.8 % (ref 36.0–46.0)
Hemoglobin: 14.5 g/dL (ref 12.0–15.0)
Immature Granulocytes: 0 %
Lymphocytes Relative: 29 %
Lymphs Abs: 2.2 10*3/uL (ref 0.7–4.0)
MCH: 30.5 pg (ref 26.0–34.0)
MCHC: 33.9 g/dL (ref 30.0–36.0)
MCV: 90.1 fL (ref 80.0–100.0)
Monocytes Absolute: 0.5 10*3/uL (ref 0.1–1.0)
Monocytes Relative: 7 %
Neutro Abs: 4.6 10*3/uL (ref 1.7–7.7)
Neutrophils Relative %: 60 %
Platelet Count: 360 10*3/uL (ref 150–400)
RBC: 4.75 MIL/uL (ref 3.87–5.11)
RDW: 13.1 % (ref 11.5–15.5)
WBC Count: 7.7 10*3/uL (ref 4.0–10.5)
nRBC: 0 % (ref 0.0–0.2)

## 2021-01-14 NOTE — Progress Notes (Signed)
Follow-up Note: Gyn-Onc  Consult was requested by Dr. Julien Girt for the evaluation of Brooke Khan 55 y.o. female  CC:  Chief Complaint  Patient presents with   Ovarian Cancer    Counseling and coordination of care     Assessment/Plan:  Brooke Khan  is a 55 y.o.  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  She is doing well postoperatively and cleared to assume adjuvant chemotherapy with 3-6 cycles of carboplatin and paclitaxel.  I recommend genetic screening.  I counseled the patient regarding her pathology, recommended treatment plan, and risk for recurrence after treatment.  She will follow-up with my partner, Dr. Berline Lopes, after completing therapy and alternate these visits with Dr. Alvy Bimler, for surveillance of her ovarian cancer as I am leaving the practice in October 2022.   HPI: Ms Brooke Khan is a 55 year old P2 who was seen in consultation at the request of Dr Julien Girt for evaluation of a 20cm ovarian complex mass and elevated CA 125.  The patient had progressively increasing discomfort in her epigastric region in April and May 2022.  This prompted her to tell her primary care provider about it who performed an abdominal x-ray which was suspicious for constipation.  The patient then use magnesium citrate for her constipation and developed sharp right-sided abdominal pains which prompted her to go to an emergency room at [facility] where she was on vacation at [employer].  A CT scan was performed which identified a complex cystic mass likely arising from an ovary.  She followed up with her gynecologist, Dr. Julien Girt, on 11/08/2020.  Transvaginal ultrasound scan with abdominal ultrasound was performed and revealed a uterus measuring 8.8 x 5.6 x 2.5 cm with an endometrium of 1.2 mm.  There was a 19.9 x 18.6 x 18.6 cm complex cystic mass likely arising from the right ovary with low-level  echoes seen in the right adnexa.  There was no normal ovarian tissue seen.  There was a slightly irregular solid area with papillary projections seen on the wall of the cyst but no blood flow within the main cystic mass and very minimal blood flow seen to the solid area.  The left ovary was not visualized.  For Ca1 25 was drawn on 11/08/2020 and was mildly elevated at 62.7.  CEA was normal at 2.2.  Interval Hx:  On 12/03/20 she underwent robotic assisted total hysterectomy with BSO, pelvic and para-aortic lymphadenectomy, omental biopsy, mini laparotomy for cyst decompression. Intraoperative findings were significant for a 20 cm cystic mass which was smooth and had attachments between the inferior aspect of the cystic mass in the posterior pelvic peritoneum.  Normal-appearing omentum and lymph nodes.  There was invasive malignancy on frozen section evaluation.  No gross extraovarian disease.  Clinical stage I disease.  No gross residual disease at completion of the procedure. Surgery was uncomplicated.  Final pathology revealed a grade 3 endometrioid carcinoma of the right ovary.  Para-aortic and pelvic lymph nodes bilaterally were negative.  The omentum was benign.  There was no metastatic carcinoma in the contralateral ovary, bilateral fallopian tubes, uterus with cervix.  All peritoneal biopsies were free of disease.  Peritoneal washings were negative.  She was determined to have a stage I C3 high-grade endometrioid right ovarian cancer (carcinoma).  Due to the high-grade nature of her lesion she was recommended adjuvant chemotherapy with carboplatin and paclitaxel for 3-6  cycles and genetic testing.  Since surgery she has done well with no specific complaints after some initial pelvic pressure that was evaluated without clear etiology.   Current Meds:  Outpatient Encounter Medications as of 01/14/2021  Medication Sig   dexamethasone (DECADRON) 4 MG tablet Take 2 tabs at the night before and 2 tab the  morning of chemotherapy, every 3 weeks, by mouth x 6 cycles   levothyroxine (SYNTHROID) 50 MCG tablet TAKE 1 TABLET BY MOUTH EVERY DAY (Patient taking differently: Take 50 mcg by mouth daily before breakfast.)   ondansetron (ZOFRAN) 8 MG tablet Take 1 tablet (8 mg total) by mouth every 8 (eight) hours as needed for refractory nausea / vomiting.   prochlorperazine (COMPAZINE) 10 MG tablet Take 1 tablet (10 mg total) by mouth every 6 (six) hours as needed (Nausea or vomiting).   vitamin B-12 (CYANOCOBALAMIN) 1000 MCG tablet Take 1,000 mcg by mouth in the morning.   vitamin C (ASCORBIC ACID) 500 MG tablet Take 500 mg by mouth in the morning.   Vitamin D, Ergocalciferol, (DRISDOL) 1.25 MG (50000 UNIT) CAPS capsule Take 1 capsule (50,000 Units total) by mouth every 7 (seven) days.   Calcium Carb-Cholecalciferol (CALCIUM 600+D3 PO) Take 2 tablets by mouth in the morning. (Patient not taking: Reported on 01/14/2021)   [DISCONTINUED] Cholecalciferol (VITAMIN D3) 125 MCG (5000 UT) TABS Take 5,000 Units by mouth every Monday.   [DISCONTINUED] ciprofloxacin (CIPRO) 250 MG tablet Take 1 tablet (250 mg total) by mouth 2 (two) times daily.   [DISCONTINUED] clindamycin (CLEOCIN) 300 MG capsule Take 1 capsule (300 mg total) by mouth 3 (three) times daily.   [DISCONTINUED] ibuprofen (ADVIL) 800 MG tablet TAKE 1 TABLET BY MOUTH EVERY 8 HOURS AS NEEDED FOR AFTER SURGERY ONLY   [DISCONTINUED] Omega-3 Fatty Acids (FISH OIL) 1000 MG CAPS Take 1,000 mg by mouth in the morning.   [DISCONTINUED] senna-docusate (SENOKOT-S) 8.6-50 MG tablet Take 2 tablets by mouth at bedtime. For AFTER surgery, do not take if having diarrhea   [DISCONTINUED] traMADol (ULTRAM) 50 MG tablet TAKE 1 TABLET BY MOUTH EVERY 6 HOURS AS NEEDED FOR SEVERE PAIN*FOR AFTER SURGERY ONLY*   No facility-administered encounter medications on file as of 01/14/2021.    Allergy:  Allergies  Allergen Reactions   Codeine Nausea Only and Other (See Comments)     "Feel bad"    Social Hx:   Social History   Socioeconomic History   Marital status: Married    Spouse name: Not on file   Number of children: Not on file   Years of education: Not on file   Highest education level: Not on file  Occupational History   Not on file  Tobacco Use   Smoking status: Never   Smokeless tobacco: Never  Vaping Use   Vaping Use: Never used  Substance and Sexual Activity   Alcohol use: Yes    Alcohol/week: 0.0 standard drinks    Comment: occ; twice a month    Drug use: No   Sexual activity: Yes    Partners: Male    Comment: husband  Other Topics Concern   Not on file  Social History Narrative   Married 30 years October 2020. Daughter went to [place]. Son went to [place]. In 2020, daughter Corporate treasurer in [place], son moving to [place] and to work for [employer] in Engineer, mining. 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       Hobbies: tennis, plays mahjong  in womens group      Social Determinants of Health   Financial Resource Strain: Not on file  Food Insecurity: Not on file  Transportation Needs: Not on file  Physical Activity: Not on file  Stress: Not on file  Social Connections: Not on file  Intimate Partner Violence: Not on file    Past Surgical Hx:  Past Surgical History:  Procedure Laterality Date   CESAREAN SECTION     x2   cosmetic knee surgery     as child   LAPAROTOMY N/A 12/03/2020   Procedure: MINI LAPAROTOMY FOR CYST DECOMPRESSION;  Surgeon: Everitt Amber, MD;  Location: [facility];  Service: Gynecology;  Laterality: N/A;   ROBOTIC ASSISTED TOTAL HYSTERECTOMY WITH BILATERAL SALPINGO OOPHERECTOMY N/A 12/03/2020   Procedure: XI ROBOTIC ASSISTED TOTAL HYSTERECTOMY WITH BILATERAL SALPINGO OOPHORECTOMY WITH PARA-AORTIC AND PELVIC LYMPHADNECTOMY AND OMENTECTOMY;  Surgeon: Everitt Amber, MD;  Location: [facility];  Service: Gynecology;  Laterality: N/A;    Past Medical Hx:  Past Medical History:  Diagnosis Date   Bradycardia     Chicken pox    Elevated blood pressure reading    Epigastric abdominal pain    Family history of breast cancer 12/26/2020   Family history of prostate cancer 12/26/2020   Heart murmur    since childhood- not always heard   Hyperglycemia    Hyperlipidemia    borderline- no meds   Hypothyroidism (acquired)    Lightheaded    Pre-diabetes    Vitamin D deficiency     Past Gynecological History: See HPI, the patient is menopausal Patient's last menstrual period was 05/11/2014.  Family Hx:  Family History  Problem Relation Age of Onset   Elevated Lipids Mother    Prostate cancer Father        dx 78s   Elevated Lipids Father    Heart disease Father        mid 27s. had been remote smoker.    Hypertension Father    Diabetes Father        mid 107s   Dementia Father        vascular based   Skin cancer Father        SCC, dx after 58   Healthy Brother    Breast cancer Maternal Aunt 63   Prostate cancer Maternal Uncle        dx 4s   Lymphoma Paternal Uncle        dx after 94   Diabetes Maternal Grandmother    Arthritis Maternal Grandfather    Diabetes Paternal Grandfather    Cancer Cousin        unknown type; ?throat ?lymphoma d. 60   Colon cancer Neg Hx    Colon polyps Neg Hx    Esophageal cancer Neg Hx    Rectal cancer Neg Hx    Stomach cancer Neg Hx     Review of Systems:  Constitutional  Feels well,   ENT Normal appearing ears and nares bilaterally Skin/Breast  No rash, sores, jaundice, itching, dryness Cardiovascular  No chest pain, shortness of breath, or edema  Pulmonary  No cough or wheeze.  Gastro Intestinal  Normal GI function Genito Urinary  No frequency, urgency, dysuria, no bleeding Musculo Skeletal  No myalgia, arthralgia, joint swelling or pain  Neurologic  No weakness, numbness, change in gait,  Psychology  No depression, anxiety, insomnia.   Vitals:  Blood pressure (!) 156/76, pulse 82, temperature 97.8 F (36.6 C), resp. rate 18, weight  158 lb 3.2 oz (71.8 kg), last menstrual period 05/11/2014, SpO2 99 %.  Physical Exam: WD in NAD Neck  Supple NROM, without any enlargements.  Lymph Node Survey No cervical supraclavicular or inguinal adenopathy Cardiovascular  Well perfused peripheries Lungs  No increased WOB Skin  No rash/lesions/breakdown  Psychiatry  Alert and oriented to person, place, and time  Abdomen  Normoactive bowel sounds, abdomen non-tender and mildly obese/overweight. without evidence of hernia. Well healed incisions.  Back No CVA tenderness Genito Urinary  Vaginal cuff well healed.  Rectal  deferred Extremities  No bilateral cyanosis, clubbing or edema.  30 minutes of direct face to face counseling time was spent with the patient. This included discussion about prognosis, therapy recommendations and postoperative side effects and are beyond the scope of routine postoperative care.  Thereasa Solo, MD  01/14/2021, 12:27 PM

## 2021-01-14 NOTE — Patient Instructions (Signed)
Dr Denman George is recommending 6 doses of chemotherapy for your stage IC ovarian cancer.  She recommends that you return to the [facility] to see her partner, Dr Berline Lopes, after you have completed this therapy. You will alternate visits every 3 months between Dr Berline Lopes and Dr Alvy Bimler for 2 years.  Please avoid intercourse for another 1 month.

## 2021-01-14 NOTE — Progress Notes (Addendum)
Provided Brooke Khan with the [employer]ign[employer] cold cap and card to bring to her first infusion tomorrow.  Went over that she needs to go the [employer] and order her kit/cards and to call if she has any questions. Also went over using a gentle, sulfate free shampoo and to avoid blow drying and curling her hair during treatment. She verbalized agreement and will order a kit and card to replace the extra card for the infusion room.

## 2021-01-14 NOTE — Progress Notes (Signed)
Called pt to introduce myself as her Arboriculturist.  Unfortunately there aren't any foundations offering copay assistance for her Dx and the type of ins she has.  I informed her of the [employer], went over what it covers and gave her the income requirement.  Pt stated she exceeds that amount so she doesn't qualify for the grant at this time.  I will request the registration staff give her my card in case her situation changes and for any questions or concerns she may have in the future.

## 2021-01-15 ENCOUNTER — Encounter: Payer: Self-pay | Admitting: Hematology and Oncology

## 2021-01-15 ENCOUNTER — Inpatient Hospital Stay: Payer: BC Managed Care – PPO

## 2021-01-15 VITALS — BP 157/71 | HR 50 | Temp 97.7°F | Resp 18 | Ht 62.0 in | Wt 162.5 lb

## 2021-01-15 DIAGNOSIS — Z5111 Encounter for antineoplastic chemotherapy: Secondary | ICD-10-CM | POA: Diagnosis not present

## 2021-01-15 DIAGNOSIS — E559 Vitamin D deficiency, unspecified: Secondary | ICD-10-CM | POA: Diagnosis not present

## 2021-01-15 DIAGNOSIS — R011 Cardiac murmur, unspecified: Secondary | ICD-10-CM | POA: Diagnosis not present

## 2021-01-15 DIAGNOSIS — C561 Malignant neoplasm of right ovary: Secondary | ICD-10-CM

## 2021-01-15 DIAGNOSIS — Z803 Family history of malignant neoplasm of breast: Secondary | ICD-10-CM | POA: Diagnosis not present

## 2021-01-15 DIAGNOSIS — Z79899 Other long term (current) drug therapy: Secondary | ICD-10-CM | POA: Diagnosis not present

## 2021-01-15 DIAGNOSIS — E039 Hypothyroidism, unspecified: Secondary | ICD-10-CM | POA: Diagnosis not present

## 2021-01-15 DIAGNOSIS — Z8042 Family history of malignant neoplasm of prostate: Secondary | ICD-10-CM | POA: Diagnosis not present

## 2021-01-15 DIAGNOSIS — Z809 Family history of malignant neoplasm, unspecified: Secondary | ICD-10-CM | POA: Diagnosis not present

## 2021-01-15 MED ORDER — FAMOTIDINE 20 MG IN NS 100 ML IVPB
20.0000 mg | Freq: Once | INTRAVENOUS | Status: AC
  Administered 2021-01-15: 20 mg via INTRAVENOUS
  Filled 2021-01-15: qty 100

## 2021-01-15 MED ORDER — PALONOSETRON HCL INJECTION 0.25 MG/5ML
0.2500 mg | Freq: Once | INTRAVENOUS | Status: AC
  Administered 2021-01-15: 0.25 mg via INTRAVENOUS
  Filled 2021-01-15: qty 5

## 2021-01-15 MED ORDER — SODIUM CHLORIDE 0.9 % IV SOLN
175.0000 mg/m2 | Freq: Once | INTRAVENOUS | Status: AC
  Administered 2021-01-15: 312 mg via INTRAVENOUS
  Filled 2021-01-15: qty 52

## 2021-01-15 MED ORDER — SODIUM CHLORIDE 0.9 % IV SOLN
10.0000 mg | Freq: Once | INTRAVENOUS | Status: AC
  Administered 2021-01-15: 10 mg via INTRAVENOUS
  Filled 2021-01-15: qty 10

## 2021-01-15 MED ORDER — SODIUM CHLORIDE 0.9 % IV SOLN
150.0000 mg | Freq: Once | INTRAVENOUS | Status: AC
  Administered 2021-01-15: 150 mg via INTRAVENOUS
  Filled 2021-01-15: qty 150

## 2021-01-15 MED ORDER — SODIUM CHLORIDE 0.9 % IV SOLN
655.8000 mg | Freq: Once | INTRAVENOUS | Status: AC
  Administered 2021-01-15: 660 mg via INTRAVENOUS
  Filled 2021-01-15: qty 66

## 2021-01-15 MED ORDER — SODIUM CHLORIDE 0.9 % IV SOLN: Freq: Once | INTRAVENOUS | Status: AC

## 2021-01-15 MED ORDER — DIPHENHYDRAMINE HCL 50 MG/ML IJ SOLN
50.0000 mg | Freq: Once | INTRAMUSCULAR | Status: AC
  Administered 2021-01-15: 50 mg via INTRAVENOUS
  Filled 2021-01-15: qty 1

## 2021-01-15 NOTE — Patient Instructions (Signed)
[facility]  Discharge Instructions: Thank you for choosing [facility] to provide your oncology and hematology care.   If you have a lab appointment with the Cancer Center, please go directly to the Cancer Center and check in at the registration area.   Wear comfortable clothing and clothing appropriate for easy access to any Portacath or PICC line.   We strive to give you quality time with your provider. You may need to reschedule your appointment if you arrive late (15 or more minutes).  Arriving late affects you and other patients whose appointments are after yours.  Also, if you miss three or more appointments without notifying the office, you may be dismissed from the clinic at the provider's discretion.      For prescription refill requests, have your pharmacy contact our office and allow 72 hours for refills to be completed.    Today you received the following chemotherapy and/or immunotherapy agents: Paclitaxel (Taxol) and Carboplatin.   To help prevent nausea and vomiting after your treatment, we encourage you to take your nausea medication as directed.  BELOW ARE SYMPTOMS THAT SHOULD BE REPORTED IMMEDIATELY: *FEVER GREATER THAN 100.4 F (38 C) OR HIGHER *CHILLS OR SWEATING *NAUSEA AND VOMITING THAT IS NOT CONTROLLED WITH YOUR NAUSEA MEDICATION *UNUSUAL SHORTNESS OF BREATH *UNUSUAL BRUISING OR BLEEDING *URINARY PROBLEMS (pain or burning when urinating, or frequent urination) *BOWEL PROBLEMS (unusual diarrhea, constipation, pain near the anus) TENDERNESS IN MOUTH AND THROAT WITH OR WITHOUT PRESENCE OF ULCERS (sore throat, sores in mouth, or a toothache) UNUSUAL RASH, SWELLING OR PAIN  UNUSUAL VAGINAL DISCHARGE OR ITCHING   Items with * indicate a potential emergency and should be followed up as soon as possible or go to the Emergency Department if any problems should occur.  Please show the CHEMOTHERAPY ALERT CARD or IMMUNOTHERAPY  ALERT CARD at check-in to the Emergency Department and triage nurse.  Should you have questions after your visit or need to cancel or reschedule your appointment, please contact [facility]  Dept: [phone]  and follow the prompts.  Office hours are 8:00 a.m. to 4:30 p.m. Monday - Friday. Please note that voicemails left after 4:00 p.m. may not be returned until the following business day.  We are closed weekends and major holidays. You have access to a nurse at all times for urgent questions. Please call the main number to the clinic Dept: [phone] and follow the prompts.   For any non-urgent questions, you may also contact your provider using MyChart. We now offer e-Visits for anyone 18 and older to request care online for non-urgent symptoms. For details visit [email or web address].   Also download the MyChart app! Go to the app store, search "MyChart", open the app, select [facility], and log in with your MyChart username and password.  Due to Covid, a mask is required upon entering the hospital/clinic. If you do not have a mask, one will be given to you upon arrival. For doctor visits, patients may have 1 support person aged 18 or older with them. For treatment visits, patients cannot have anyone with them due to current Covid guidelines and our immunocompromised population.   

## 2021-01-15 NOTE — Assessment & Plan Note (Signed)
Her wound is adequately healed Per prior discussion, she will proceed with chemotherapy with the use of hair preservation I will see her again in 3 weeks for further follow-up

## 2021-01-15 NOTE — Progress Notes (Signed)
Patient noted increased discomfort at IV site following using the restroom and returning for carboplatin infusion. Patient had completed 3 hour paclitaxel and carboplatin had been hung. Infusion paused immediately and area proximal to the IV site noted to be tender, pink, and edematous. IV sited disconnected and and site aspirated for any fluid. IV removed and cold pack placed per protocol. New IV established and carboplatin infusion restarted. Dr. Burr Medico came to chairside to assess site. Patient provided education and information on extravasation after care. Patient knows to return to clinic as outlined on education sheet. Charge nurse and Dr. Calton Dach desk nurse, Hassan Rowan, made aware of the situation.  Patient verbalized an understanding of all information and will return to clinic tomorrow for next assessment.

## 2021-01-15 NOTE — Assessment & Plan Note (Signed)
Recent UTI has resolved Observe closely

## 2021-01-15 NOTE — Assessment & Plan Note (Signed)
She is prone to get constipated We discussed the importance of laxative therapy

## 2021-01-15 NOTE — Progress Notes (Signed)
[place] OFFICE PROGRESS NOTE  Patient Care Team: [name], MD as PCP - General (Family Medicine)  ASSESSMENT & PLAN:  Ovarian cancer, right [facility]) Her wound is adequately healed Per prior discussion, she will proceed with chemotherapy with the use of hair preservation I will see her again in 3 weeks for further follow-up  UTI (urinary tract infection) Recent UTI has resolved Observe closely  Other constipation She is prone to get constipated We discussed the importance of laxative therapy  No orders of the defined types were placed in this encounter.   All questions were answered. The patient knows to call the clinic with any problems, questions or concerns. The total time spent in the appointment was 20 minutes encounter with patients including review of chart and various tests results, discussions about plan of care and coordination of care plan   [name], MD 01/15/2021 9:15 AM  INTERVAL HISTORY: Please see below for problem oriented charting. she returns for treatment follow-up to be seen prior to cycle 1 of carboplatin and paclitaxel She is fully healed from surgery She denies signs or symptoms of UTI She has questions related to cranial prosthesis/hair preservation She denies recent constipation  REVIEW OF SYSTEMS:   Constitutional: Denies fevers, chills or abnormal weight loss Eyes: Denies blurriness of vision Ears, nose, mouth, throat, and face: Denies mucositis or sore throat Respiratory: Denies cough, dyspnea or wheezes Cardiovascular: Denies palpitation, chest discomfort or lower extremity swelling Gastrointestinal:  Denies nausea, heartburn or change in bowel habits Skin: Denies abnormal skin rashes Lymphatics: Denies new lymphadenopathy or easy bruising Neurological:Denies numbness, tingling or new weaknesses Behavioral/Psych: Mood is stable, no new changes  All other systems were reviewed with the patient and are negative.  I  have reviewed the past medical history, past surgical history, social history and family history with the patient and they are unchanged from previous note.  ALLERGIES:  is allergic to codeine.  MEDICATIONS:  Current Outpatient Medications  Medication Sig Dispense Refill   Calcium Carb-Cholecalciferol (CALCIUM 600+D3 PO) Take 2 tablets by mouth in the morning. (Patient not taking: Reported on 01/14/2021)     dexamethasone (DECADRON) 4 MG tablet Take 2 tabs at the night before and 2 tab the morning of chemotherapy, every 3 weeks, by mouth x 6 cycles 36 tablet 6   levothyroxine (SYNTHROID) 50 MCG tablet TAKE 1 TABLET BY MOUTH EVERY DAY (Patient taking differently: Take 50 mcg by mouth daily before breakfast.) 90 tablet 3   ondansetron (ZOFRAN) 8 MG tablet Take 1 tablet (8 mg total) by mouth every 8 (eight) hours as needed for refractory nausea / vomiting. 30 tablet 1   prochlorperazine (COMPAZINE) 10 MG tablet Take 1 tablet (10 mg total) by mouth every 6 (six) hours as needed (Nausea or vomiting). 30 tablet 1   vitamin B-12 (CYANOCOBALAMIN) 1000 MCG tablet Take 1,000 mcg by mouth in the morning.     vitamin C (ASCORBIC ACID) 500 MG tablet Take 500 mg by mouth in the morning.     Vitamin D, Ergocalciferol, (DRISDOL) 1.25 MG (50000 UNIT) CAPS capsule Take 1 capsule (50,000 Units total) by mouth every 7 (seven) days. 12 capsule 0   No current facility-administered medications for this visit.   Facility-Administered Medications Ordered in Other Visits  Medication Dose Route Frequency Provider Last Rate Last Admin   CARBOplatin (PARAPLATIN) 660 mg in sodium chloride 0.9 % 250 mL chemo infusion  660 mg Intravenous Once [name], MD  dexamethasone (DECADRON) 10 mg in sodium chloride 0.9 % 50 mL IVPB  10 mg Intravenous Once Alvy Bimler, Daveah Varone, MD       fosaprepitant (EMEND) 150 mg in sodium chloride 0.9 % 145 mL IVPB  150 mg Intravenous Once Alvy Bimler, Chrystle Murillo, MD       PACLitaxel (TAXOL) 312 mg in sodium  chloride 0.9 % 500 mL chemo infusion (> '80mg'$ /m2)  175 mg/m2 (Treatment Plan Recorded) Intravenous Once [name], MD        SUMMARY OF ONCOLOGIC HISTORY: Oncology History Overview Note  Endometrioid cancer FIGO grade 3   Ovarian cancer, right ([place])   Initial Diagnosis   Ovarian cancer, right ([place])   11/20/2020 Imaging   CT scan of abdomen and pelvis  1. There is a large, cystic mass of the right ovary with peripheral nodular soft tissue components measuring at least 19.8 x 13.3 x 19.8 cm. Findings are consistent with primary ovarian malignancy. 2. Minimal stranding of the omentum in the ventral abdomen and minimal thickening of the peritoneum in the right lower quadrant in the right paracolic gutter without evidence of discrete omental or peritoneal nodularity. Findings are modestly suspicious for early peritoneal metastatic disease. 3. No lymphadenopathy or other evidence of metastatic disease in the abdomen or pelvis. 4. Polyp or adherent gallstone near the gallbladder fundus, better assessed by prior ultrasound. 5. Large burden of stool throughout the colon and rectum.   Aortic Atherosclerosis (ICD10-I70.0).   12/03/2020 Pathology Results   SURGICAL PATHOLOGY  CASE: [id number]  PATIENT: Brooke Khan  Surgical Pathology Report   Clinical History: Ovarian mass (crm)    FINAL MICROSCOPIC DIAGNOSIS:   A. UTERUS, CERVIX, BILATERAL FALLOPIAN TUBES AND OVARIES:  Ovary, right  -  Endometrioid carcinoma, FIGO grade 3  -  See oncology table and comment below   Ovary, left:  -  No carcinoma identified   Bilateral Fallopian tubes:  -  No carcinoma identified   Uterus:  -  No carcinoma identified   Cervix:  -  No carcinoma identified   B. LYMPH NODES, RIGHT PARAAORTIC, RESECTION:  -  No carcinoma identified in six lymph nodes (0/6)   C. LYMPH NODES, LEFT PARAAORTIC, RESECTION:  -  No carcinoma identified in five lymph nodes (0/5)   D. LYMPH NODES, RIGHT PELVIC,  RESECTION:  -  No carcinoma identified in five lymph nodes (0/5)   E. LYMPH NODES, LEFT PELVIC, RESECTION:  -  No carcinoma identified in three lymph nodes (0/3)   F. OMENTUM, OMENTECTOMY:  -  No carcinoma identified   G. PERITONEUM, ANTERIOR PELVIC, BIOPSY:  -  No carcinoma identified   H. PERITONEUM, RIGHT PELVIC, BIOPSY:  -  No carcinoma identified   I. PERITONEUM, LEFT PELVIC, BIOPSY:  -  No carcinoma identified   J. PERITONEUM, POSTERIOR PELVIC, BIOPSY:  -  No carcinoma identified   K. PERITONEUM, RIGHT ABDOMINAL, BIOPSY:  -  No carcinoma identified   L. PERITONEUM, LEFT ABDOMINAL, BIOPSY:  -  No carcinoma identified    ONCOLOGY TABLE:   OVARY or FALLOPIAN TUBE or PRIMARY PERITONEUM: Resection   Procedure: Hysterectomy with bilateral salpingo-oophorectomy  Specimen Integrity: Intact  Tumor Site: Ovary, right  Tumor Size: 1.5 cm  Histologic Type: Endometrioid carcinoma  Histologic Grade: FIGO grade 3  Ovarian Surface Involvement: Not identified  Fallopian Tube Surface Involvement: Not identified  Other Tissue/ Organ Involvement: Not applicable  Largest Extrapelvic Peritoneal Focus: Not applicable  Peritoneal/Ascitic Fluid Involvement: Malignant cells not identified  Chemotherapy  Response Score (CRS): Not applicable, no known presurgical therapy  Regional Lymph Nodes:       Number of Nodes with Metastasis Greater than 10 mm: 0       Number of Nodes with Metastasis 10 mm or Less (excludes isolated  tumor cells): 0       Number of Nodes with Isolated Tumor Cells (0.2 mm or less): 0       Number of Lymph Nodes Examined: 19  Distant Metastasis:       Distant Site(s) Involved: Not applicable  Pathologic Stage Classification (pTNM, AJCC 8th Edition): pT1a, pN0  Ancillary Studies: Can be performed upon request  Representative Tumor Block: A8  Comment(s): Dr. Vic Ripper reviewed the case and agrees with the above diagnosis.     12/03/2020 Surgery   Surgeon: Donaciano Eva    Operation: Robotic-assisted laparoscopic total hysterectomy with bilateral salpingoophorectomy, omentectomy, pelvic and para-aortic lymphadnectomy.     Surgeon: Donaciano Eva    Operative Findings:  : 20cm cystic mass, smooth. Attachments between inferior aspect of cystic mass and posterior pelvic peritoneum. Normal appearing omentum and nodes. Invasive malignancy on evaluation. No gross extra-ovarian disease. Clinical stage I disease. No gross residual disease.    12/11/2020 Cancer Staging   Staging form: Ovary, Fallopian Tube, and Primary Peritoneal Carcinoma, AJCC 8th Edition - Pathologic stage from 12/11/2020: FIGO Stage IA (pT1a, pN0, cM0) - Signed by [name], MD on 12/11/2020 Stage prefix: Initial diagnosis   01/15/2021 -  Chemotherapy    Patient is on Treatment Plan: OVARIAN CARBOPLATIN (AUC 6) / PACLITAXEL (175) Q21D X 6 CYCLES         PHYSICAL EXAMINATION: ECOG PERFORMANCE STATUS: 0 - Asymptomatic  Vitals:   01/14/21 1112  BP: (!) 156/76  Pulse: 82  Resp: 18  Temp: 97.8 F (36.6 C)  SpO2: 99%   Filed Weights   01/14/21 1112  Weight: 158 lb 3.2 oz (71.8 kg)    GENERAL:alert, no distress and comfortable SKIN: skin color, texture, turgor are normal, no rashes or significant lesions EYES: normal, Conjunctiva are pink and non-injected, sclera clear OROPHARYNX:no exudate, no erythema and lips, buccal mucosa, and tongue normal  NECK: supple, thyroid normal size, non-tender, without nodularity LYMPH:  no palpable lymphadenopathy in the cervical, axillary or inguinal LUNGS: clear to auscultation and percussion with normal breathing effort HEART: regular rate & rhythm and no murmurs and no lower extremity edema ABDOMEN:abdomen soft, non-tender and normal bowel sounds.  Noted well-healed surgical scar Musculoskeletal:no cyanosis of digits and no clubbing  NEURO: alert & oriented x 3 with fluent speech, no focal motor/sensory deficits  LABORATORY  DATA:  I have reviewed the data as listed    Component Value Date/Time   NA 145 01/14/2021 1049   K 4.2 01/14/2021 1049   CL 106 01/14/2021 1049   CO2 27 01/14/2021 1049   GLUCOSE 93 01/14/2021 1049   BUN 16 01/14/2021 1049   CREATININE 0.84 01/14/2021 1049   CALCIUM 10.4 (H) 01/14/2021 1049   PROT 8.0 01/14/2021 1049   ALBUMIN 4.2 01/14/2021 1049   AST 22 01/14/2021 1049   ALT 42 01/14/2021 1049   ALKPHOS 89 01/14/2021 1049   BILITOT 0.3 01/14/2021 1049   GFRNONAA >60 01/14/2021 1049    No results found for: SPEP, UPEP  Lab Results  Component Value Date   WBC 7.7 01/14/2021   NEUTROABS 4.6 01/14/2021   HGB 14.5 01/14/2021   HCT 42.8 01/14/2021   MCV 90.1  01/14/2021   PLT 360 01/14/2021      Chemistry      Component Value Date/Time   NA 145 01/14/2021 1049   K 4.2 01/14/2021 1049   CL 106 01/14/2021 1049   CO2 27 01/14/2021 1049   BUN 16 01/14/2021 1049   CREATININE 0.84 01/14/2021 1049      Component Value Date/Time   CALCIUM 10.4 (H) 01/14/2021 1049   ALKPHOS 89 01/14/2021 1049   AST 22 01/14/2021 1049   ALT 42 01/14/2021 1049   BILITOT 0.3 01/14/2021 1049

## 2021-01-16 ENCOUNTER — Inpatient Hospital Stay: Payer: BC Managed Care – PPO

## 2021-01-16 ENCOUNTER — Other Ambulatory Visit: Payer: Self-pay

## 2021-01-16 NOTE — Progress Notes (Signed)
Patient returned for 24-hour extravasation check. Site still presents with mild edema, which has decreased since the previous date. Patient notes decreased tenderness at site. Patient encouraged to continue utilizing cold packs on site for 15-20 minutes four times a day as needed and to elevate the extremity. Patient verbalized an understanding and discharged in stable condition.

## 2021-01-17 ENCOUNTER — Inpatient Hospital Stay: Payer: BC Managed Care – PPO

## 2021-01-17 NOTE — Progress Notes (Signed)
Patient presented for extravasation follow up. The area had mild edema, redness, patient denied pain. She is aware to continue to ice as previously directed and to call with any concerns or if the area worsens prior to the follow up on 9/14.

## 2021-01-20 ENCOUNTER — Telehealth: Payer: Self-pay

## 2021-01-20 NOTE — Telephone Encounter (Signed)
Called and scheduled appt at [place] with Dr. Alvy Bimler to evaluate IV site and discuss options.

## 2021-01-21 ENCOUNTER — Telehealth: Payer: Self-pay | Admitting: Genetic Counselor

## 2021-01-21 ENCOUNTER — Encounter: Payer: Self-pay | Admitting: Genetic Counselor

## 2021-01-21 ENCOUNTER — Other Ambulatory Visit: Payer: Self-pay

## 2021-01-21 ENCOUNTER — Inpatient Hospital Stay (HOSPITAL_BASED_OUTPATIENT_CLINIC_OR_DEPARTMENT_OTHER): Payer: BC Managed Care – PPO | Admitting: Hematology and Oncology

## 2021-01-21 ENCOUNTER — Encounter: Payer: Self-pay | Admitting: Hematology and Oncology

## 2021-01-21 DIAGNOSIS — C561 Malignant neoplasm of right ovary: Secondary | ICD-10-CM

## 2021-01-21 DIAGNOSIS — R011 Cardiac murmur, unspecified: Secondary | ICD-10-CM | POA: Diagnosis not present

## 2021-01-21 DIAGNOSIS — I808 Phlebitis and thrombophlebitis of other sites: Secondary | ICD-10-CM

## 2021-01-21 DIAGNOSIS — K5909 Other constipation: Secondary | ICD-10-CM | POA: Diagnosis not present

## 2021-01-21 DIAGNOSIS — Z809 Family history of malignant neoplasm, unspecified: Secondary | ICD-10-CM | POA: Diagnosis not present

## 2021-01-21 DIAGNOSIS — E039 Hypothyroidism, unspecified: Secondary | ICD-10-CM | POA: Diagnosis not present

## 2021-01-21 DIAGNOSIS — Z79899 Other long term (current) drug therapy: Secondary | ICD-10-CM | POA: Diagnosis not present

## 2021-01-21 DIAGNOSIS — Z8042 Family history of malignant neoplasm of prostate: Secondary | ICD-10-CM | POA: Diagnosis not present

## 2021-01-21 DIAGNOSIS — E559 Vitamin D deficiency, unspecified: Secondary | ICD-10-CM | POA: Diagnosis not present

## 2021-01-21 DIAGNOSIS — I809 Phlebitis and thrombophlebitis of unspecified site: Secondary | ICD-10-CM | POA: Insufficient documentation

## 2021-01-21 DIAGNOSIS — Z803 Family history of malignant neoplasm of breast: Secondary | ICD-10-CM | POA: Diagnosis not present

## 2021-01-21 DIAGNOSIS — Z1379 Encounter for other screening for genetic and chromosomal anomalies: Secondary | ICD-10-CM | POA: Insufficient documentation

## 2021-01-21 DIAGNOSIS — Z5111 Encounter for antineoplastic chemotherapy: Secondary | ICD-10-CM | POA: Diagnosis not present

## 2021-01-21 MED ORDER — LIDOCAINE-PRILOCAINE 2.5-2.5 % EX CREA: 1.0000 "application " | TOPICAL_CREAM | CUTANEOUS | 0 refills | Status: DC | PRN

## 2021-01-21 NOTE — Telephone Encounter (Signed)
Called Brooke Khan to discuss results of genetic testing.  Patient unable to speak with depth at time of call.  Requested call back at her earliest convenience to discuss results.

## 2021-01-21 NOTE — Assessment & Plan Note (Signed)
She has signs of superficial thrombophlebitis Slight chemotherapy extravasation is possible but there is no signs of injury to the soft tissue She is reassured Recommend acetaminophen along with warm compress We discussed the risk and benefits of port placement for future treatment and she will think about it

## 2021-01-21 NOTE — Assessment & Plan Note (Signed)
Her treatment course was complicated by constipation as well as superficial thrombophlebitis Her constipation has resolved Continue supportive care

## 2021-01-21 NOTE — Assessment & Plan Note (Signed)
Her bowel habits is regular with conservative approach We discussed importance of regular laxative and avoid Metamucil in this situation

## 2021-01-21 NOTE — Progress Notes (Signed)
[name] OFFICE PROGRESS NOTE  Patient Care Team: [name], MD as PCP - General (Family Medicine)  ASSESSMENT & PLAN:  Ovarian cancer, right [facility]) Her treatment course was complicated by constipation as well as superficial thrombophlebitis Her constipation has resolved Continue supportive care  Superficial thrombophlebitis She has signs of superficial thrombophlebitis Slight chemotherapy extravasation is possible but there is no signs of injury to the soft tissue She is reassured Recommend acetaminophen along with warm compress We discussed the risk and benefits of port placement for future treatment and she will think about it   Other constipation Her bowel habits is regular with conservative approach We discussed importance of regular laxative and avoid Metamucil in this situation  No orders of the defined types were placed in this encounter.   All questions were answered. The patient knows to call the clinic with any problems, questions or concerns. The total time spent in the appointment was 20 minutes encounter with patients including review of chart and various tests results, discussions about plan of care and coordination of care plan   [name], MD 01/21/2021 10:04 AM  INTERVAL HISTORY: Please see below for problem oriented charting. she returns for follow-up regarding recent signs of skin injury after chemotherapy administration through the dorsum of her left hand After chemotherapy, she complained of some tenderness and pain at the site of chemotherapy infusion Slight chemotherapy extravasation was suspected and she is being followed closely on a daily basis She has been putting some warm compresses Overall, it is improving No recent fever or chills She has intact mobility and sensation throughout She has very mild neuropathy for few days but that has resolved She is taking laxatives to avoid severe constipation  REVIEW OF SYSTEMS:    Constitutional: Denies fevers, chills or abnormal weight loss Eyes: Denies blurriness of vision Ears, nose, mouth, throat, and face: Denies mucositis or sore throat Respiratory: Denies cough, dyspnea or wheezes Cardiovascular: Denies palpitation, chest discomfort or lower extremity swelling Gastrointestinal:  Denies nausea, heartburn or change in bowel habits Lymphatics: Denies new lymphadenopathy or easy bruising Neurological:Denies numbness, tingling or new weaknesses Behavioral/Psych: Mood is stable, no new changes  All other systems were reviewed with the patient and are negative.  I have reviewed the past medical history, past surgical history, social history and family history with the patient and they are unchanged from previous note.  ALLERGIES:  is allergic to codeine.  MEDICATIONS:  Current Outpatient Medications  Medication Sig Dispense Refill   lidocaine-prilocaine (EMLA) cream Apply 1 application topically as needed. 30 g 0   Calcium Carb-Cholecalciferol (CALCIUM 600+D3 PO) Take 2 tablets by mouth in the morning. (Patient not taking: Reported on 01/14/2021)     dexamethasone (DECADRON) 4 MG tablet Take 2 tabs at the night before and 2 tab the morning of chemotherapy, every 3 weeks, by mouth x 6 cycles 36 tablet 6   levothyroxine (SYNTHROID) 50 MCG tablet TAKE 1 TABLET BY MOUTH EVERY DAY (Patient taking differently: Take 50 mcg by mouth daily before breakfast.) 90 tablet 3   ondansetron (ZOFRAN) 8 MG tablet Take 1 tablet (8 mg total) by mouth every 8 (eight) hours as needed for refractory nausea / vomiting. 30 tablet 1   prochlorperazine (COMPAZINE) 10 MG tablet Take 1 tablet (10 mg total) by mouth every 6 (six) hours as needed (Nausea or vomiting). 30 tablet 1   vitamin B-12 (CYANOCOBALAMIN) 1000 MCG tablet Take 1,000 mcg by mouth in the morning.  vitamin C (ASCORBIC ACID) 500 MG tablet Take 500 mg by mouth in the morning.     Vitamin D, Ergocalciferol, (DRISDOL) 1.25 MG  (50000 UNIT) CAPS capsule Take 1 capsule (50,000 Units total) by mouth every 7 (seven) days. 12 capsule 0   No current facility-administered medications for this visit.    SUMMARY OF ONCOLOGIC HISTORY: Oncology History Overview Note  Endometrioid cancer FIGO grade 3   Ovarian cancer, right ([place])   Initial Diagnosis   Ovarian cancer, right ([place])   11/20/2020 Imaging   CT scan of abdomen and pelvis  1. There is a large, cystic mass of the right ovary with peripheral nodular soft tissue components measuring at least 19.8 x 13.3 x 19.8 cm. Findings are consistent with primary ovarian malignancy. 2. Minimal stranding of the omentum in the ventral abdomen and minimal thickening of the peritoneum in the right lower quadrant in the right paracolic gutter without evidence of discrete omental or peritoneal nodularity. Findings are modestly suspicious for early peritoneal metastatic disease. 3. No lymphadenopathy or other evidence of metastatic disease in the abdomen or pelvis. 4. Polyp or adherent gallstone near the gallbladder fundus, better assessed by prior ultrasound. 5. Large burden of stool throughout the colon and rectum.   Aortic Atherosclerosis (ICD10-I70.0).   12/03/2020 Pathology Results   SURGICAL PATHOLOGY  CASE: [id number]  PATIENT: Brooke Khan  Surgical Pathology Report   Clinical History: Ovarian mass (crm)    FINAL MICROSCOPIC DIAGNOSIS:   A. UTERUS, CERVIX, BILATERAL FALLOPIAN TUBES AND OVARIES:  Ovary, right  -  Endometrioid carcinoma, FIGO grade 3  -  See oncology table and comment below   Ovary, left:  -  No carcinoma identified   Bilateral Fallopian tubes:  -  No carcinoma identified   Uterus:  -  No carcinoma identified   Cervix:  -  No carcinoma identified   B. LYMPH NODES, RIGHT PARAAORTIC, RESECTION:  -  No carcinoma identified in six lymph nodes (0/6)   C. LYMPH NODES, LEFT PARAAORTIC, RESECTION:  -  No carcinoma identified in five lymph  nodes (0/5)   D. LYMPH NODES, RIGHT PELVIC, RESECTION:  -  No carcinoma identified in five lymph nodes (0/5)   E. LYMPH NODES, LEFT PELVIC, RESECTION:  -  No carcinoma identified in three lymph nodes (0/3)   F. OMENTUM, OMENTECTOMY:  -  No carcinoma identified   G. PERITONEUM, ANTERIOR PELVIC, BIOPSY:  -  No carcinoma identified   H. PERITONEUM, RIGHT PELVIC, BIOPSY:  -  No carcinoma identified   I. PERITONEUM, LEFT PELVIC, BIOPSY:  -  No carcinoma identified   J. PERITONEUM, POSTERIOR PELVIC, BIOPSY:  -  No carcinoma identified   K. PERITONEUM, RIGHT ABDOMINAL, BIOPSY:  -  No carcinoma identified   L. PERITONEUM, LEFT ABDOMINAL, BIOPSY:  -  No carcinoma identified    ONCOLOGY TABLE:   OVARY or FALLOPIAN TUBE or PRIMARY PERITONEUM: Resection   Procedure: Hysterectomy with bilateral salpingo-oophorectomy  Specimen Integrity: Intact  Tumor Site: Ovary, right  Tumor Size: 1.5 cm  Histologic Type: Endometrioid carcinoma  Histologic Grade: FIGO grade 3  Ovarian Surface Involvement: Not identified  Fallopian Tube Surface Involvement: Not identified  Other Tissue/ Organ Involvement: Not applicable  Largest Extrapelvic Peritoneal Focus: Not applicable  Peritoneal/Ascitic Fluid Involvement: Malignant cells not identified  Chemotherapy Response Score (CRS): Not applicable, no known presurgical therapy  Regional Lymph Nodes:       Number of Nodes with Metastasis Greater than 10 mm: 0  Number of Nodes with Metastasis 10 mm or Less (excludes isolated  tumor cells): 0       Number of Nodes with Isolated Tumor Cells (0.2 mm or less): 0       Number of Lymph Nodes Examined: 19  Distant Metastasis:       Distant Site(s) Involved: Not applicable  Pathologic Stage Classification (pTNM, AJCC 8th Edition): pT1a, pN0  Ancillary Studies: Can be performed upon request  Representative Tumor Block: A8  Comment(s): Dr. Vic Ripper reviewed the case and agrees with the above diagnosis.      12/03/2020 Surgery   Surgeon: Donaciano Eva    Operation: Robotic-assisted laparoscopic total hysterectomy with bilateral salpingoophorectomy, omentectomy, pelvic and para-aortic lymphadnectomy.     Surgeon: Donaciano Eva    Operative Findings:  : 20cm cystic mass, smooth. Attachments between inferior aspect of cystic mass and posterior pelvic peritoneum. Normal appearing omentum and nodes. Invasive malignancy on evaluation. No gross extra-ovarian disease. Clinical stage I disease. No gross residual disease.    12/11/2020 Cancer Staging   Staging form: Ovary, Fallopian Tube, and Primary Peritoneal Carcinoma, AJCC 8th Edition - Pathologic stage from 12/11/2020: FIGO Stage IA (pT1a, pN0, cM0) - Signed by [name], MD on 12/11/2020 Stage prefix: Initial diagnosis   01/15/2021 -  Chemotherapy    Patient is on Treatment Plan: OVARIAN CARBOPLATIN (AUC 6) / PACLITAXEL (175) Q21D X 6 CYCLES         PHYSICAL EXAMINATION: ECOG PERFORMANCE STATUS: 1 - Symptomatic but completely ambulatory  Vitals:   01/21/21 0844  BP: (!) 146/55  Pulse: 65  Resp: 18  Temp: 98 F (36.7 C)  SpO2: 100%   Filed Weights   01/21/21 0844  Weight: 161 lb 6.4 oz (73.2 kg)    GENERAL:alert, no distress and comfortable SKIN: She has signs of superficial thrombophlebitis affecting the dorsum of her left hand.  Minor bruising is noted.  No signs of cellulitis NEURO: alert & oriented x 3 with fluent speech, no focal motor/sensory deficits  LABORATORY DATA:  I have reviewed the data as listed    Component Value Date/Time   NA 145 01/14/2021 1049   K 4.2 01/14/2021 1049   CL 106 01/14/2021 1049   CO2 27 01/14/2021 1049   GLUCOSE 93 01/14/2021 1049   BUN 16 01/14/2021 1049   CREATININE 0.84 01/14/2021 1049   CALCIUM 10.4 (H) 01/14/2021 1049   PROT 8.0 01/14/2021 1049   ALBUMIN 4.2 01/14/2021 1049   AST 22 01/14/2021 1049   ALT 42 01/14/2021 1049   ALKPHOS 89 01/14/2021 1049   BILITOT  0.3 01/14/2021 1049   GFRNONAA >60 01/14/2021 1049    No results found for: SPEP, UPEP  Lab Results  Component Value Date   WBC 7.7 01/14/2021   NEUTROABS 4.6 01/14/2021   HGB 14.5 01/14/2021   HCT 42.8 01/14/2021   MCV 90.1 01/14/2021   PLT 360 01/14/2021      Chemistry      Component Value Date/Time   NA 145 01/14/2021 1049   K 4.2 01/14/2021 1049   CL 106 01/14/2021 1049   CO2 27 01/14/2021 1049   BUN 16 01/14/2021 1049   CREATININE 0.84 01/14/2021 1049      Component Value Date/Time   CALCIUM 10.4 (H) 01/14/2021 1049   ALKPHOS 89 01/14/2021 1049   AST 22 01/14/2021 1049   ALT 42 01/14/2021 1049   BILITOT 0.3 01/14/2021 1049

## 2021-01-22 ENCOUNTER — Ambulatory Visit: Payer: Self-pay | Admitting: Genetic Counselor

## 2021-01-22 ENCOUNTER — Inpatient Hospital Stay: Payer: BC Managed Care – PPO

## 2021-01-22 DIAGNOSIS — Z1379 Encounter for other screening for genetic and chromosomal anomalies: Secondary | ICD-10-CM

## 2021-01-22 DIAGNOSIS — Z8042 Family history of malignant neoplasm of prostate: Secondary | ICD-10-CM

## 2021-01-22 DIAGNOSIS — Z803 Family history of malignant neoplasm of breast: Secondary | ICD-10-CM

## 2021-01-22 DIAGNOSIS — C561 Malignant neoplasm of right ovary: Secondary | ICD-10-CM

## 2021-01-22 NOTE — Telephone Encounter (Signed)
Revealed negative genetic testing.  Discussed that we do not know why she has ovarian cancer or why there is cancer in the family. It could be familial, due to a different gene that we are not testing, or maybe our current technology may not be able to pick something up.  It will be important for her to keep in contact with genetics to keep up with whether additional testing may be needed.  Reviewed negative somatic testing.

## 2021-01-24 ENCOUNTER — Other Ambulatory Visit: Payer: Self-pay | Admitting: Family Medicine

## 2021-01-27 ENCOUNTER — Encounter (HOSPITAL_COMMUNITY): Payer: Self-pay | Admitting: Hematology and Oncology

## 2021-02-03 MED FILL — Dexamethasone Sodium Phosphate Inj 100 MG/10ML: INTRAMUSCULAR | Qty: 1 | Status: AC

## 2021-02-03 MED FILL — Fosaprepitant Dimeglumine For IV Infusion 150 MG (Base Eq): INTRAVENOUS | Qty: 5 | Status: AC

## 2021-02-04 ENCOUNTER — Ambulatory Visit: Payer: BC Managed Care – PPO

## 2021-02-04 ENCOUNTER — Inpatient Hospital Stay (HOSPITAL_BASED_OUTPATIENT_CLINIC_OR_DEPARTMENT_OTHER): Payer: BC Managed Care – PPO | Admitting: Hematology and Oncology

## 2021-02-04 ENCOUNTER — Inpatient Hospital Stay: Payer: BC Managed Care – PPO

## 2021-02-04 ENCOUNTER — Other Ambulatory Visit: Payer: Self-pay

## 2021-02-04 ENCOUNTER — Encounter: Payer: Self-pay | Admitting: Hematology and Oncology

## 2021-02-04 DIAGNOSIS — Z79899 Other long term (current) drug therapy: Secondary | ICD-10-CM | POA: Diagnosis not present

## 2021-02-04 DIAGNOSIS — E039 Hypothyroidism, unspecified: Secondary | ICD-10-CM | POA: Diagnosis not present

## 2021-02-04 DIAGNOSIS — Z8042 Family history of malignant neoplasm of prostate: Secondary | ICD-10-CM | POA: Diagnosis not present

## 2021-02-04 DIAGNOSIS — Z5111 Encounter for antineoplastic chemotherapy: Secondary | ICD-10-CM | POA: Diagnosis not present

## 2021-02-04 DIAGNOSIS — Z809 Family history of malignant neoplasm, unspecified: Secondary | ICD-10-CM | POA: Diagnosis not present

## 2021-02-04 DIAGNOSIS — C561 Malignant neoplasm of right ovary: Secondary | ICD-10-CM

## 2021-02-04 DIAGNOSIS — K5909 Other constipation: Secondary | ICD-10-CM

## 2021-02-04 DIAGNOSIS — Z803 Family history of malignant neoplasm of breast: Secondary | ICD-10-CM | POA: Diagnosis not present

## 2021-02-04 DIAGNOSIS — E559 Vitamin D deficiency, unspecified: Secondary | ICD-10-CM | POA: Diagnosis not present

## 2021-02-04 DIAGNOSIS — I808 Phlebitis and thrombophlebitis of other sites: Secondary | ICD-10-CM

## 2021-02-04 DIAGNOSIS — R011 Cardiac murmur, unspecified: Secondary | ICD-10-CM | POA: Diagnosis not present

## 2021-02-04 LAB — CMP (CANCER CENTER ONLY)
ALT: 32 U/L (ref 0–44)
AST: 15 U/L (ref 15–41)
Albumin: 4.2 g/dL (ref 3.5–5.0)
Alkaline Phosphatase: 95 U/L (ref 38–126)
Anion gap: 12 (ref 5–15)
BUN: 21 mg/dL — ABNORMAL HIGH (ref 6–20)
CO2: 20 mmol/L — ABNORMAL LOW (ref 22–32)
Calcium: 10.3 mg/dL (ref 8.9–10.3)
Chloride: 111 mmol/L (ref 98–111)
Creatinine: 0.74 mg/dL (ref 0.44–1.00)
GFR, Estimated: >60 mL/min (ref >60)
Glucose, Bld: 137 mg/dL — ABNORMAL HIGH (ref 70–99)
Potassium: 4 mmol/L (ref 3.5–5.1)
Sodium: 143 mmol/L (ref 135–145)
Total Bilirubin: 0.3 mg/dL (ref 0.3–1.2)
Total Protein: 8.1 g/dL (ref 6.5–8.1)

## 2021-02-04 LAB — CBC WITH DIFFERENTIAL (CANCER CENTER ONLY)
Abs Immature Granulocytes: 0.13 10*3/uL — ABNORMAL HIGH (ref 0.00–0.07)
Basophils Absolute: 0 10*3/uL (ref 0.0–0.1)
Basophils Relative: 0 %
Eosinophils Absolute: 0 10*3/uL (ref 0.0–0.5)
Eosinophils Relative: 0 %
HCT: 43.1 % (ref 36.0–46.0)
Hemoglobin: 14.5 g/dL (ref 12.0–15.0)
Immature Granulocytes: 1 %
Lymphocytes Relative: 14 %
Lymphs Abs: 1.4 10*3/uL (ref 0.7–4.0)
MCH: 30.3 pg (ref 26.0–34.0)
MCHC: 33.6 g/dL (ref 30.0–36.0)
MCV: 90.2 fL (ref 80.0–100.0)
Monocytes Absolute: 0.1 10*3/uL (ref 0.1–1.0)
Monocytes Relative: 1 %
Neutro Abs: 8.7 10*3/uL — ABNORMAL HIGH (ref 1.7–7.7)
Neutrophils Relative %: 84 %
Platelet Count: 265 10*3/uL (ref 150–400)
RBC: 4.78 MIL/uL (ref 3.87–5.11)
RDW: 13.5 % (ref 11.5–15.5)
WBC Count: 10.4 10*3/uL (ref 4.0–10.5)
nRBC: 0 % (ref 0.0–0.2)

## 2021-02-04 MED ORDER — SODIUM CHLORIDE 0.9 % IV SOLN
150.0000 mg | Freq: Once | INTRAVENOUS | Status: AC
  Administered 2021-02-04: 150 mg via INTRAVENOUS
  Filled 2021-02-04: qty 150

## 2021-02-04 MED ORDER — PALONOSETRON HCL INJECTION 0.25 MG/5ML
0.2500 mg | Freq: Once | INTRAVENOUS | Status: AC
  Administered 2021-02-04: 0.25 mg via INTRAVENOUS
  Filled 2021-02-04: qty 5

## 2021-02-04 MED ORDER — SODIUM CHLORIDE 0.9 % IV SOLN
10.0000 mg | Freq: Once | INTRAVENOUS | Status: AC
  Administered 2021-02-04: 10 mg via INTRAVENOUS
  Filled 2021-02-04: qty 10

## 2021-02-04 MED ORDER — SODIUM CHLORIDE 0.9 % IV SOLN: Freq: Once | INTRAVENOUS | Status: AC

## 2021-02-04 MED ORDER — SODIUM CHLORIDE 0.9 % IV SOLN
175.0000 mg/m2 | Freq: Once | INTRAVENOUS | Status: AC
  Administered 2021-02-04: 312 mg via INTRAVENOUS
  Filled 2021-02-04: qty 52

## 2021-02-04 MED ORDER — FAMOTIDINE 20 MG IN NS 100 ML IVPB
20.0000 mg | Freq: Once | INTRAVENOUS | Status: AC
  Administered 2021-02-04: 20 mg via INTRAVENOUS
  Filled 2021-02-04: qty 100

## 2021-02-04 MED ORDER — DIPHENHYDRAMINE HCL 50 MG/ML IJ SOLN
50.0000 mg | Freq: Once | INTRAMUSCULAR | Status: AC
  Administered 2021-02-04: 50 mg via INTRAVENOUS
  Filled 2021-02-04: qty 1

## 2021-02-04 MED ORDER — SODIUM CHLORIDE 0.9 % IV SOLN
681.6000 mg | Freq: Once | INTRAVENOUS | Status: AC
  Administered 2021-02-04: 680 mg via INTRAVENOUS
  Filled 2021-02-04: qty 68

## 2021-02-04 NOTE — Patient Instructions (Signed)
[facility] MEDICAL [facility]COLOGY  Discharge Instructions: Thank you for choosing [facility] to provide your oncology and hematology care.   If you have a lab appointment with the Cancer Center, please go directly to the Cancer Center and check in at the registration area.   Wear comfortable clothing and clothing appropriate for easy access to any Portacath or PICC line.   We strive to give you quality time with your provider. You may need to reschedule your appointment if you arrive late (15 or more minutes).  Arriving late affects you and other patients whose appointments are after yours.  Also, if you miss three or more appointments without notifying the office, you may be dismissed from the clinic at the provider's discretion.      For prescription refill requests, have your pharmacy contact our office and allow 72 hours for refills to be completed.    Today you received the following chemotherapy and/or immunotherapy agents: Paclitaxel (Taxol) and Carboplatin.   To help prevent nausea and vomiting after your treatment, we encourage you to take your nausea medication as directed.  BELOW ARE SYMPTOMS THAT SHOULD BE REPORTED IMMEDIATELY: *FEVER GREATER THAN 100.4 F (38 C) OR HIGHER *CHILLS OR SWEATING *NAUSEA AND VOMITING THAT IS NOT CONTROLLED WITH YOUR NAUSEA MEDICATION *UNUSUAL SHORTNESS OF BREATH *UNUSUAL BRUISING OR BLEEDING *URINARY PROBLEMS (pain or burning when urinating, or frequent urination) *BOWEL PROBLEMS (unusual diarrhea, constipation, pain near the anus) TENDERNESS IN MOUTH AND THROAT WITH OR WITHOUT PRESENCE OF ULCERS (sore throat, sores in mouth, or a toothache) UNUSUAL RASH, SWELLING OR PAIN  UNUSUAL VAGINAL DISCHARGE OR ITCHING   Items with * indicate a potential emergency and should be followed up as soon as possible or go to the Emergency Department if any problems should occur.  Please show the CHEMOTHERAPY ALERT CARD or IMMUNOTHERAPY  ALERT CARD at check-in to the Emergency Department and triage nurse.  Should you have questions after your visit or need to cancel or reschedule your appointment, please contact [facility]  Dept: [phone]  and follow the prompts.  Office hours are 8:00 a.m. to 4:30 p.m. Monday - Friday. Please note that voicemails left after 4:00 p.m. may not be returned until the following business day.  We are closed weekends and major holidays. You have access to a nurse at all times for urgent questions. Please call the main number to the clinic Dept: [phone] and follow the prompts.   For any non-urgent questions, you may also contact your provider using MyChart. We now offer e-Visits for anyone 18 and older to request care online for non-urgent symptoms. For details visit [email or web address].   Also download the MyChart app! Go to the app store, search "MyChart", open the app, select [facility], and log in with your MyChart username and password.  Due to Covid, a mask is required upon entering the hospital/clinic. If you do not have a mask, one will be given to you upon arrival. For doctor visits, patients may have 1 support person aged 18 or older with them. For treatment visits, patients cannot have anyone with them due to current Covid guidelines and our immunocompromised population.   

## 2021-02-04 NOTE — Progress Notes (Signed)
[place] OFFICE PROGRESS NOTE  Patient Care Team: [name], MD as PCP - General (Family Medicine)  ASSESSMENT & PLAN:  Ovarian cancer, right [facility]) She is fully recovered from recent side effects from treatment We will continue treatment as scheduled A month after her last dose of treatment, we will repeat imaging study  Superficial thrombophlebitis This is resolved She is reassured  Other constipation Her constipation has resolved Moving forward, I recommend aggressive laxative therapy  No orders of the defined types were placed in this encounter.   All questions were answered. The patient knows to call the clinic with any problems, questions or concerns. The total time spent in the appointment was 20 minutes encounter with patients including review of chart and various tests results, discussions about plan of care and coordination of care plan   Brooke Lark, MD 02/04/2021 9:35 AM  INTERVAL HISTORY: Please see below for problem oriented charting. she returns for treatment follow-up on cycle 2 of carboplatin and paclitaxel for ovarian cancer Since last time I saw her, she felt better No further concern for superficial thrombophlebitis Her bowel habits are normal No peripheral neuropathy  REVIEW OF SYSTEMS:   Constitutional: Denies fevers, chills or abnormal weight loss Eyes: Denies blurriness of vision Ears, nose, mouth, throat, and face: Denies mucositis or sore throat Respiratory: Denies cough, dyspnea or wheezes Cardiovascular: Denies palpitation, chest discomfort or lower extremity swelling Gastrointestinal:  Denies nausea, heartburn or change in bowel habits Skin: Denies abnormal skin rashes Lymphatics: Denies new lymphadenopathy or easy bruising Neurological:Denies numbness, tingling or new weaknesses Behavioral/Psych: Mood is stable, no new changes  All other systems were reviewed with the patient and are negative.  I have reviewed the  past medical history, past surgical history, social history and family history with the patient and they are unchanged from previous note.  ALLERGIES:  is allergic to codeine.  MEDICATIONS:  Current Outpatient Medications  Medication Sig Dispense Refill   Calcium Carb-Cholecalciferol (CALCIUM 600+D3 PO) Take 2 tablets by mouth in the morning. (Patient not taking: Reported on 01/14/2021)     dexamethasone (DECADRON) 4 MG tablet Take 2 tabs at the night before and 2 tab the morning of chemotherapy, every 3 weeks, by mouth x 6 cycles 36 tablet 6   levothyroxine (SYNTHROID) 50 MCG tablet TAKE 1 TABLET BY MOUTH EVERY DAY 90 tablet 3   lidocaine-prilocaine (EMLA) cream Apply 1 application topically as needed. 30 g 0   ondansetron (ZOFRAN) 8 MG tablet Take 1 tablet (8 mg total) by mouth every 8 (eight) hours as needed for refractory nausea / vomiting. 30 tablet 1   prochlorperazine (COMPAZINE) 10 MG tablet Take 1 tablet (10 mg total) by mouth every 6 (six) hours as needed (Nausea or vomiting). 30 tablet 1   vitamin B-12 (CYANOCOBALAMIN) 1000 MCG tablet Take 1,000 mcg by mouth in the morning.     vitamin C (ASCORBIC ACID) 500 MG tablet Take 500 mg by mouth in the morning.     Vitamin D, Ergocalciferol, (DRISDOL) 1.25 MG (50000 UNIT) CAPS capsule Take 1 capsule (50,000 Units total) by mouth every 7 (seven) days. 12 capsule 0   No current facility-administered medications for this visit.    SUMMARY OF ONCOLOGIC HISTORY: Oncology History Overview Note  Endometrioid cancer FIGO grade 3 Neg genetics   Ovarian cancer, right [facility])   Initial Diagnosis   Ovarian cancer, right ([place])   11/20/2020 Imaging   CT scan of abdomen and pelvis  1. There is a large, cystic mass of the right ovary with peripheral nodular soft tissue components measuring at least 19.8 x 13.3 x 19.8 cm. Findings are consistent with primary ovarian malignancy. 2. Minimal stranding of the omentum in the ventral abdomen and minimal  thickening of the peritoneum in the right lower quadrant in the right paracolic gutter without evidence of discrete omental or peritoneal nodularity. Findings are modestly suspicious for early peritoneal metastatic disease. 3. No lymphadenopathy or other evidence of metastatic disease in the abdomen or pelvis. 4. Polyp or adherent gallstone near the gallbladder fundus, better assessed by prior ultrasound. 5. Large burden of stool throughout the colon and rectum.   Aortic Atherosclerosis (ICD10-I70.0).   12/03/2020 Pathology Results   SURGICAL PATHOLOGY  CASE: [id number]  PATIENT: Brooke Khan  Surgical Pathology Report   Clinical History: Ovarian mass (crm)    FINAL MICROSCOPIC DIAGNOSIS:   A. UTERUS, CERVIX, BILATERAL FALLOPIAN TUBES AND OVARIES:  Ovary, right  -  Endometrioid carcinoma, FIGO grade 3  -  See oncology table and comment below   Ovary, left:  -  No carcinoma identified   Bilateral Fallopian tubes:  -  No carcinoma identified   Uterus:  -  No carcinoma identified   Cervix:  -  No carcinoma identified   B. LYMPH NODES, RIGHT PARAAORTIC, RESECTION:  -  No carcinoma identified in six lymph nodes (0/6)   C. LYMPH NODES, LEFT PARAAORTIC, RESECTION:  -  No carcinoma identified in five lymph nodes (0/5)   D. LYMPH NODES, RIGHT PELVIC, RESECTION:  -  No carcinoma identified in five lymph nodes (0/5)   E. LYMPH NODES, LEFT PELVIC, RESECTION:  -  No carcinoma identified in three lymph nodes (0/3)   F. OMENTUM, OMENTECTOMY:  -  No carcinoma identified   G. PERITONEUM, ANTERIOR PELVIC, BIOPSY:  -  No carcinoma identified   H. PERITONEUM, RIGHT PELVIC, BIOPSY:  -  No carcinoma identified   I. PERITONEUM, LEFT PELVIC, BIOPSY:  -  No carcinoma identified   J. PERITONEUM, POSTERIOR PELVIC, BIOPSY:  -  No carcinoma identified   K. PERITONEUM, RIGHT ABDOMINAL, BIOPSY:  -  No carcinoma identified   L. PERITONEUM, LEFT ABDOMINAL, BIOPSY:  -  No carcinoma  identified    ONCOLOGY TABLE:   OVARY or FALLOPIAN TUBE or PRIMARY PERITONEUM: Resection   Procedure: Hysterectomy with bilateral salpingo-oophorectomy  Specimen Integrity: Intact  Tumor Site: Ovary, right  Tumor Size: 1.5 cm  Histologic Type: Endometrioid carcinoma  Histologic Grade: FIGO grade 3  Ovarian Surface Involvement: Not identified  Fallopian Tube Surface Involvement: Not identified  Other Tissue/ Organ Involvement: Not applicable  Largest Extrapelvic Peritoneal Focus: Not applicable  Peritoneal/Ascitic Fluid Involvement: Malignant cells not identified  Chemotherapy Response Score (CRS): Not applicable, no known presurgical therapy  Regional Lymph Nodes:       Number of Nodes with Metastasis Greater than 10 mm: 0       Number of Nodes with Metastasis 10 mm or Less (excludes isolated  tumor cells): 0       Number of Nodes with Isolated Tumor Cells (0.2 mm or less): 0       Number of Lymph Nodes Examined: 19  Distant Metastasis:       Distant Site(s) Involved: Not applicable  Pathologic Stage Classification (pTNM, AJCC 8th Edition): pT1a, pN0  Ancillary Studies: Can be performed upon request  Representative Tumor Block: A8  Comment(s): Dr. Vic Ripper reviewed the case and agrees with the  above diagnosis.     12/03/2020 Surgery   Surgeon: Donaciano Eva    Operation: Robotic-assisted laparoscopic total hysterectomy with bilateral salpingoophorectomy, omentectomy, pelvic and para-aortic lymphadnectomy.     Surgeon: Donaciano Eva    Operative Findings:  : 20cm cystic mass, smooth. Attachments between inferior aspect of cystic mass and posterior pelvic peritoneum. Normal appearing omentum and nodes. Invasive malignancy on evaluation. No gross extra-ovarian disease. Clinical stage I disease. No gross residual disease.    12/11/2020 Cancer Staging   Staging form: Ovary, Fallopian Tube, and Primary Peritoneal Carcinoma, AJCC 8th Edition - Pathologic stage from  12/11/2020: FIGO Stage IA (pT1a, pN0, cM0) - Signed by Brooke Lark, MD on 12/11/2020 Stage prefix: Initial diagnosis   01/10/2021 Genetic Testing   Negative germline and somatic genetic testing: no pathogenic variants detected in [employer] MyRisk Panel.   No somatic variants detected in [employer] [facility]Choice HRD testing.  Genomic instaiblity score is negative.  The report dates are January 10, 2021.    The [facility] gene panel offered by [employer] includes sequencing and deletion/duplication testing of the following 48 genes: APC, ATM, AXIN2, BAP1, BARD1, BMPR1A, BRCA1, BRCA2, BRIP1, CHD1, CDK4, CDKN2A(p16 and p14ARF), CHEK2, CTNNA1, EGFR, EPCAM, FH, FLCN, GREM1, HOXB13, MEN1, MET, MITF , MLH1, MSH2, MSH3, MSH6, MUTYH, NTHL1, PALB2, PMS2, POLD1, POLE, PTEN, RAD51C, RAD51D, RET, SDHA, SDHB, SDHC, SDHD, SMAD4, STK11,TERT, TP53, TSC1, TSC2, and VHL.    01/15/2021 -  Chemotherapy   Patient is on Treatment Plan : OVARIAN Carboplatin (AUC 6) / Paclitaxel (175) q21d x 6 cycles       PHYSICAL EXAMINATION: ECOG PERFORMANCE STATUS: 0 - Asymptomatic  Vitals:   02/04/21 0909  BP: (!) 142/70  Pulse: 79  Resp: 18  Temp: 97.9 F (36.6 C)  SpO2: 99%   Filed Weights   02/04/21 0909  Weight: 164 lb (74.4 kg)    GENERAL:alert, no distress and comfortable SKIN: skin color, texture, turgor are normal, no rashes or significant lesions EYES: normal, Conjunctiva are pink and non-injected, sclera clear OROPHARYNX:no exudate, no erythema and lips, buccal mucosa, and tongue normal  NECK: supple, thyroid normal size, non-tender, without nodularity LYMPH:  no palpable lymphadenopathy in the cervical, axillary or inguinal LUNGS: clear to auscultation and percussion with normal breathing effort HEART: regular rate & rhythm and no murmurs and no lower extremity edema ABDOMEN:abdomen soft, non-tender and normal bowel sounds Musculoskeletal:no cyanosis of digits and no clubbing  NEURO: alert & oriented x 3  with fluent speech, no focal motor/sensory deficits  LABORATORY DATA:  I have reviewed the data as listed    Component Value Date/Time   NA 143 02/04/2021 0844   K 4.0 02/04/2021 0844   CL 111 02/04/2021 0844   CO2 20 (L) 02/04/2021 0844   GLUCOSE 137 (H) 02/04/2021 0844   BUN 21 (H) 02/04/2021 0844   CREATININE 0.74 02/04/2021 0844   CALCIUM 10.3 02/04/2021 0844   PROT 8.1 02/04/2021 0844   ALBUMIN 4.2 02/04/2021 0844   AST 15 02/04/2021 0844   ALT 32 02/04/2021 0844   ALKPHOS 95 02/04/2021 0844   BILITOT 0.3 02/04/2021 0844   GFRNONAA >60 02/04/2021 0844    No results found for: SPEP, UPEP  Lab Results  Component Value Date   WBC 10.4 02/04/2021   NEUTROABS 8.7 (H) 02/04/2021   HGB 14.5 02/04/2021   HCT 43.1 02/04/2021   MCV 90.2 02/04/2021   PLT 265 02/04/2021      Chemistry  Component Value Date/Time   NA 143 02/04/2021 0844   K 4.0 02/04/2021 0844   CL 111 02/04/2021 0844   CO2 20 (L) 02/04/2021 0844   BUN 21 (H) 02/04/2021 0844   CREATININE 0.74 02/04/2021 0844      Component Value Date/Time   CALCIUM 10.3 02/04/2021 0844   ALKPHOS 95 02/04/2021 0844   AST 15 02/04/2021 0844   ALT 32 02/04/2021 0844   BILITOT 0.3 02/04/2021 0844

## 2021-02-04 NOTE — Assessment & Plan Note (Signed)
Her constipation has resolved Moving forward, I recommend aggressive laxative therapy

## 2021-02-04 NOTE — Assessment & Plan Note (Signed)
She is fully recovered from recent side effects from treatment We will continue treatment as scheduled A month after her last dose of treatment, we will repeat imaging study

## 2021-02-04 NOTE — Assessment & Plan Note (Signed)
This is resolved She is reassured

## 2021-02-11 ENCOUNTER — Encounter: Payer: BC Managed Care – PPO | Admitting: Gastroenterology

## 2021-02-13 ENCOUNTER — Encounter: Payer: BC Managed Care – PPO | Admitting: Gastroenterology

## 2021-02-13 ENCOUNTER — Encounter: Payer: Self-pay | Admitting: Hematology and Oncology

## 2021-02-13 NOTE — Progress Notes (Signed)
HPI:  Ms. Cleverly was previously seen in the [place] clinic due to a personal history of cancer and concerns regarding a hereditary predisposition to cancer. Please refer to our prior cancer genetics clinic note for more information regarding our discussion, assessment and recommendations, at the time. Ms. Duquette recent genetic test results were disclosed to her, as were recommendations warranted by these results. These results and recommendations are discussed in more detail below.  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 There is a large, cystic mass of the right ovary with peripheral nodular soft tissue components measuring at least 19.8 x 13.3 x 19.8 cm. Findings are consistent with primary ovarian malignancy. 2. Minimal stranding of the omentum in the ventral abdomen and minimal thickening of the peritoneum in the right lower quadrant in the right paracolic gutter without evidence of discrete omental or peritoneal nodularity. Findings are modestly suspicious for early peritoneal metastatic disease. 3. No lymphadenopathy or other evidence of metastatic disease in the abdomen or pelvis. 4. Polyp or adherent gallstone near the gallbladder fundus, better assessed by prior ultrasound. 5. Large burden of stool throughout the colon and rectum.   Aortic Atherosclerosis (ICD10-I70.0).   12/03/2020 Pathology Results   SURGICAL PATHOLOGY  CASE: [id number]  PATIENT: [name]  Surgical Pathology Report   Clinical History: Ovarian mass (crm)    FINAL MICROSCOPIC DIAGNOSIS:   A. UTERUS, CERVIX, BILATERAL FALLOPIAN TUBES AND OVARIES:  Ovary, right  -  Endometrioid carcinoma, FIGO grade 3  -  See oncology table and comment below   Ovary, left:  -  No carcinoma identified    Bilateral Fallopian tubes:  -  No carcinoma identified   Uterus:  -  No carcinoma identified   Cervix:  -  No carcinoma identified   B. LYMPH NODES, RIGHT PARAAORTIC, RESECTION:  -  No carcinoma identified in six lymph nodes (0/6)   C. LYMPH NODES, LEFT PARAAORTIC, RESECTION:  -  No carcinoma identified in five lymph nodes (0/5)   D. LYMPH NODES, RIGHT PELVIC, RESECTION:  -  No carcinoma identified in five lymph nodes (0/5)   E. LYMPH NODES, LEFT PELVIC, RESECTION:  -  No carcinoma identified in three lymph nodes (0/3)   F. OMENTUM, OMENTECTOMY:  -  No carcinoma identified   G. PERITONEUM, ANTERIOR PELVIC, BIOPSY:  -  No carcinoma identified   H. PERITONEUM, RIGHT PELVIC, BIOPSY:  -  No carcinoma identified   I. PERITONEUM, LEFT PELVIC, BIOPSY:  -  No carcinoma identified   J. PERITONEUM, POSTERIOR PELVIC, BIOPSY:  -  No carcinoma identified   K. PERITONEUM, RIGHT ABDOMINAL, BIOPSY:  -  No carcinoma identified   L. PERITONEUM, LEFT ABDOMINAL, BIOPSY:  -  No carcinoma identified    ONCOLOGY TABLE:   OVARY or FALLOPIAN TUBE or PRIMARY PERITONEUM: Resection   Procedure: Hysterectomy with bilateral salpingo-oophorectomy  Specimen Integrity: Intact  Tumor Site: Ovary, right  Tumor Size: 1.5 cm  Histologic Type: Endometrioid carcinoma  Histologic Grade: FIGO grade 3  Ovarian Surface Involvement: Not identified  Fallopian Tube Surface Involvement: Not identified  Other Tissue/ Organ Involvement: Not applicable  Largest Extrapelvic Peritoneal Focus: Not applicable  Peritoneal/Ascitic Fluid Involvement: Malignant cells not identified  Chemotherapy Response Score (CRS): Not applicable, no known  presurgical therapy  Regional Lymph Nodes:       Number of Nodes with Metastasis Greater than 10 mm: 0       Number of Nodes with Metastasis 10 mm or Less (excludes isolated  tumor cells): 0       Number of Nodes with Isolated Tumor Cells (0.2 mm or less): 0        Number of Lymph Nodes Examined: 19  Distant Metastasis:       Distant Site(s) Involved: Not applicable  Pathologic Stage Classification (pTNM, AJCC 8th Edition): pT1a, pN0  Ancillary Studies: Can be performed upon request  Representative Tumor Block: A8  Comment(s): Dr. Vic Ripper reviewed the case and agrees with the above diagnosis.     12/03/2020 Surgery   Surgeon: Donaciano Eva    Operation: Robotic-assisted laparoscopic total hysterectomy with bilateral salpingoophorectomy, omentectomy, pelvic and para-aortic lymphadnectomy.     Surgeon: Donaciano Eva    Operative Findings:  : 20cm cystic mass, smooth. Attachments between inferior aspect of cystic mass and posterior pelvic peritoneum. Normal appearing omentum and nodes. Invasive malignancy on evaluation. No gross extra-ovarian disease. Clinical stage I disease. No gross residual disease.    12/11/2020 Cancer Staging   Staging form: Ovary, Fallopian Tube, and Primary Peritoneal Carcinoma, AJCC 8th Edition - Pathologic stage from 12/11/2020: FIGO Stage IA (pT1a, pN0, cM0) - Signed by [name], MD on 12/11/2020 Stage prefix: Initial diagnosis   01/10/2021 Genetic Testing   Negative germline and somatic genetic testing: no pathogenic variants detected in [facility] MyRisk Panel.   No somatic variants detected in [facility] HRD testing.  Genomic instaiblity score is negative.  The report dates are January 10, 2021.    The [facility] gene panel offered by [employer] includes sequencing and deletion/duplication testing of the following 48 genes: APC, ATM, AXIN2, BAP1, BARD1, BMPR1A, BRCA1, BRCA2, BRIP1, CHD1, CDK4, CDKN2A(p16 and p14ARF), CHEK2, CTNNA1, EGFR, EPCAM, FH, FLCN, GREM1, HOXB13, MEN1, MET, MITF , MLH1, MSH2, MSH3, MSH6, MUTYH, NTHL1, PALB2, PMS2, POLD1, POLE, PTEN, RAD51C, RAD51D, RET, SDHA, SDHB, SDHC, SDHD, SMAD4, STK11,TERT, TP53, TSC1, TSC2, and VHL.    01/15/2021 -  Chemotherapy   Patient is on  Treatment Plan : OVARIAN Carboplatin (AUC 6) / Paclitaxel (175) q21d x 6 cycles       FAMILY HISTORY:  We obtained a detailed, 4-generation family history.  Significant diagnoses are listed below:      Family History  Problem Relation Age of Onset   Prostate cancer Father          dx 65s   Skin cancer Father          SCC, dx after 26   Breast cancer Maternal Aunt 63   Prostate cancer Maternal Uncle          dx 85s   Lymphoma Paternal Uncle          dx after 87   Cancer Cousin          unknown type; ?throat ?lymphoma d. 54     Ms. Holsapple is unaware of previous family history of genetic testing for hereditary cancer risks. There is no reported Ashkenazi Jewish ancestry. There is no known consanguinity.  GENETIC TEST RESULTS: Genetic testing reported out on January 10, 2021. The [facility] Panel found no pathogenic mutations. The [facility] gene panel offered by [employer] includes sequencing and deletion/duplication testing of the following 48 genes: APC, ATM, AXIN2, BAP1, BARD1, BMPR1A, BRCA1, BRCA2, BRIP1,  CHD1, CDK4, CDKN2A(p16 and p14ARF), CHEK2, CTNNA1, EGFR, EPCAM, FH, FLCN, GREM1, HOXB13, MEN1, MET, MITF , MLH1, MSH2, MSH3, MSH6, MUTYH, NTHL1, PALB2, PMS2, POLD1, POLE, PTEN, RAD51C, RAD51D, RET, SDHA, SDHB, SDHC, SDHD, SMAD4, STK11,TERT, TP53, TSC1, TSC2, and VHL. [facility] revealed a negative genomic instability score status and no clinically significance mutations in BRCA1/2.   The test report has been scanned into EPIC and is located under the Molecular Pathology section of the Results Review tab.  A portion of the result report is included below for reference.     We discussed with Ms. Lata that because current genetic testing is not perfect, it is possible there may be a gene mutation in one of these genes that current testing cannot detect, but that chance is small.  We also discussed, that there could be another gene that has not yet been discovered,  or that we have not yet tested, that is responsible for the cancer diagnoses in the family. It is also possible there is a hereditary cause for the cancer in the family that Ms. Bloodsaw did not inherit and therefore was not identified in her testing.  Therefore, it is important to remain in touch with cancer genetics in the future so that we can continue to offer Ms. Alleyne the most up to date genetic testing.   One goal of Ms. Ramone's genetic testing was to identify if she may be a candidate for targeted treatment with PARP inhibitors. Therefore, in addition to having germline genetic testing to analyze for inherited variants, Ms. Lax also had testing for homologous recombination deficiency (HRD).  HRD testing is a genetic test performed on the tumor that can determine somatic genetic changes that may indicate if Ms. Bencosme would benefit from PARP inhibitors.  Because her test did not detect any pathogenic variants in her germline or somatic (tumor) testing, there are no targeted treatment recommendations based on these results.  ADDITIONAL GENETIC TESTING: There are other genes that are associated with increased cancer risk that can be analyzed. Should Ms. Skeen wish to pursue additional genetic testing, we are happy to discuss and coordinate this testing, at any time.     CANCER SCREENING RECOMMENDATIONS: Ms. Feely test result is considered negative (normal).  This means that we have not identified a hereditary cause for her personal history of ovarian at this time. Most cancers are sporadic/familial, and this negative test suggests that her cancer may fall into this category.    While reassuring, this does not definitively rule out a hereditary predisposition to cancer. It is still possible that there could be genetic mutations that are undetectable by current technology. There could be genetic mutations in genes that have not been tested or identified to increase cancer risk.  Therefore, it is  recommended she continue to follow the cancer management and screening guidelines provided by her oncology and primary healthcare provider.   An individual's cancer risk and medical management are not determined by genetic test results alone. Overall cancer risk assessment incorporates additional factors, including personal medical history, family history, and any available genetic information that may result in a personalized plan for cancer prevention and surveillance  RECOMMENDATIONS FOR FAMILY MEMBERS:  Individuals in this family might be at some increased risk of developing cancer, over the general population risk, simply due to the family history of cancer.  We recommended women in this family have a yearly mammogram beginning at age 41, or 64 years younger than the earliest onset of cancer,  an annual clinical breast exam, and perform monthly breast self-exams. Women in this family should also have a gynecological exam as recommended by their primary provider. Family members should be referred for colonoscopy starting at age 26, or earlier, as recommended by their providers. Family members should notify their providers of the family history of ovarian cancer.    FOLLOW-UP: Lastly, we discussed with Ms. Conrow that cancer genetics is a rapidly advancing field and it is possible that new genetic tests will be appropriate for her and/or her family members in the future. We encouraged her to remain in contact with cancer genetics on an annual basis so we can update her personal and family histories and let her know of advances in cancer genetics that may benefit this family.   Our contact number was provided. Ms. Tarquinio questions were answered to her satisfaction, and she knows she is welcome to call us at anytime with additional questions or concerns.     Ilian Wessell M. Joette Catching, [place], [name] Genetic Counselor [email or web address] (P) [phone]

## 2021-02-21 MED FILL — Fosaprepitant Dimeglumine For IV Infusion 150 MG (Base Eq): INTRAVENOUS | Qty: 5 | Status: AC

## 2021-02-21 MED FILL — Dexamethasone Sodium Phosphate Inj 100 MG/10ML: INTRAMUSCULAR | Qty: 1 | Status: AC

## 2021-02-24 ENCOUNTER — Inpatient Hospital Stay: Payer: BC Managed Care – PPO

## 2021-02-24 ENCOUNTER — Other Ambulatory Visit: Payer: Self-pay | Admitting: Hematology and Oncology

## 2021-02-24 ENCOUNTER — Inpatient Hospital Stay: Payer: BC Managed Care – PPO | Attending: Gynecologic Oncology

## 2021-02-24 ENCOUNTER — Other Ambulatory Visit: Payer: Self-pay

## 2021-02-24 ENCOUNTER — Encounter: Payer: Self-pay | Admitting: Hematology and Oncology

## 2021-02-24 ENCOUNTER — Inpatient Hospital Stay (HOSPITAL_BASED_OUTPATIENT_CLINIC_OR_DEPARTMENT_OTHER): Payer: BC Managed Care – PPO | Admitting: Hematology and Oncology

## 2021-02-24 DIAGNOSIS — Z5111 Encounter for antineoplastic chemotherapy: Secondary | ICD-10-CM | POA: Insufficient documentation

## 2021-02-24 DIAGNOSIS — R739 Hyperglycemia, unspecified: Secondary | ICD-10-CM | POA: Insufficient documentation

## 2021-02-24 DIAGNOSIS — E559 Vitamin D deficiency, unspecified: Secondary | ICD-10-CM

## 2021-02-24 DIAGNOSIS — T50905A Adverse effect of unspecified drugs, medicaments and biological substances, initial encounter: Secondary | ICD-10-CM

## 2021-02-24 DIAGNOSIS — C561 Malignant neoplasm of right ovary: Secondary | ICD-10-CM | POA: Diagnosis not present

## 2021-02-24 DIAGNOSIS — Z23 Encounter for immunization: Secondary | ICD-10-CM | POA: Diagnosis not present

## 2021-02-24 LAB — CMP (CANCER CENTER ONLY)
ALT: 31 U/L (ref 0–44)
AST: 17 U/L (ref 15–41)
Albumin: 4.2 g/dL (ref 3.5–5.0)
Alkaline Phosphatase: 94 U/L (ref 38–126)
Anion gap: 13 (ref 5–15)
BUN: 18 mg/dL (ref 6–20)
CO2: 20 mmol/L — ABNORMAL LOW (ref 22–32)
Calcium: 10.1 mg/dL (ref 8.9–10.3)
Chloride: 110 mmol/L (ref 98–111)
Creatinine: 0.8 mg/dL (ref 0.44–1.00)
GFR, Estimated: >60 mL/min (ref >60)
Glucose, Bld: 189 mg/dL — ABNORMAL HIGH (ref 70–99)
Potassium: 4 mmol/L (ref 3.5–5.1)
Sodium: 143 mmol/L (ref 135–145)
Total Bilirubin: 0.3 mg/dL (ref 0.3–1.2)
Total Protein: 8 g/dL (ref 6.5–8.1)

## 2021-02-24 LAB — CBC WITH DIFFERENTIAL (CANCER CENTER ONLY)
Abs Immature Granulocytes: 0.05 10*3/uL (ref 0.00–0.07)
Basophils Absolute: 0 10*3/uL (ref 0.0–0.1)
Basophils Relative: 0 %
Eosinophils Absolute: 0 10*3/uL (ref 0.0–0.5)
Eosinophils Relative: 0 %
HCT: 39.9 % (ref 36.0–46.0)
Hemoglobin: 13.8 g/dL (ref 12.0–15.0)
Immature Granulocytes: 1 %
Lymphocytes Relative: 16 %
Lymphs Abs: 1.2 10*3/uL (ref 0.7–4.0)
MCH: 30.8 pg (ref 26.0–34.0)
MCHC: 34.6 g/dL (ref 30.0–36.0)
MCV: 89.1 fL (ref 80.0–100.0)
Monocytes Absolute: 0.1 10*3/uL (ref 0.1–1.0)
Monocytes Relative: 2 %
Neutro Abs: 6.3 10*3/uL (ref 1.7–7.7)
Neutrophils Relative %: 81 %
Platelet Count: 239 10*3/uL (ref 150–400)
RBC: 4.48 MIL/uL (ref 3.87–5.11)
RDW: 14.2 % (ref 11.5–15.5)
WBC Count: 7.7 10*3/uL (ref 4.0–10.5)
nRBC: 0 % (ref 0.0–0.2)

## 2021-02-24 MED ORDER — INFLUENZA VAC SPLIT QUAD 0.5 ML IM SUSY
0.5000 mL | PREFILLED_SYRINGE | Freq: Once | INTRAMUSCULAR | Status: AC
  Administered 2021-02-24: 0.5 mL via INTRAMUSCULAR
  Filled 2021-02-24: qty 0.5

## 2021-02-24 MED ORDER — SODIUM CHLORIDE 0.9 % IV SOLN
10.0000 mg | Freq: Once | INTRAVENOUS | Status: AC
  Administered 2021-02-24: 10 mg via INTRAVENOUS
  Filled 2021-02-24: qty 10

## 2021-02-24 MED ORDER — PALONOSETRON HCL INJECTION 0.25 MG/5ML
0.2500 mg | Freq: Once | INTRAVENOUS | Status: AC
  Administered 2021-02-24: 0.25 mg via INTRAVENOUS
  Filled 2021-02-24: qty 5

## 2021-02-24 MED ORDER — SODIUM CHLORIDE 0.9 % IV SOLN
150.0000 mg | Freq: Once | INTRAVENOUS | Status: AC
  Administered 2021-02-24: 150 mg via INTRAVENOUS
  Filled 2021-02-24: qty 150

## 2021-02-24 MED ORDER — FAMOTIDINE 20 MG IN NS 100 ML IVPB
20.0000 mg | Freq: Once | INTRAVENOUS | Status: AC
  Administered 2021-02-24: 20 mg via INTRAVENOUS
  Filled 2021-02-24: qty 100

## 2021-02-24 MED ORDER — SODIUM CHLORIDE 0.9 % IV SOLN
175.0000 mg/m2 | Freq: Once | INTRAVENOUS | Status: AC
  Administered 2021-02-24: 312 mg via INTRAVENOUS
  Filled 2021-02-24: qty 52

## 2021-02-24 MED ORDER — SODIUM CHLORIDE 0.9 % IV SOLN
681.6000 mg | Freq: Once | INTRAVENOUS | Status: AC
  Administered 2021-02-24: 680 mg via INTRAVENOUS
  Filled 2021-02-24: qty 68

## 2021-02-24 MED ORDER — SODIUM CHLORIDE 0.9 % IV SOLN: Freq: Once | INTRAVENOUS | Status: AC

## 2021-02-24 MED ORDER — DIPHENHYDRAMINE HCL 50 MG/ML IJ SOLN
50.0000 mg | Freq: Once | INTRAMUSCULAR | Status: AC
  Administered 2021-02-24: 50 mg via INTRAVENOUS
  Filled 2021-02-24: qty 1

## 2021-02-24 NOTE — Assessment & Plan Note (Signed)
This is due to side effects of corticosteroid therapy We discussed dietary modification before treatment in the future

## 2021-02-24 NOTE — Patient Instructions (Signed)
[facility]   Discharge Instructions: Thank you for choosing [facility] to provide your oncology and hematology care.   If you have a lab appointment with the [facility], please go directly to the [facility] and check in at the registration area.   Wear comfortable clothing and clothing appropriate for easy access to any Portacath or PICC line.   We strive to give you quality time with your provider. You may need to reschedule your appointment if you arrive late (15 or more minutes).  Arriving late affects you and other patients whose appointments are after yours.  Also, if you miss three or more appointments without notifying the office, you may be dismissed from the clinic at the provider's discretion.      For prescription refill requests, have your pharmacy contact our office and allow 72 hours for refills to be completed.    Today you received the following chemotherapy and/or immunotherapy agents: Paclitaxel (Taxol) and Carboplatin       To help prevent nausea and vomiting after your treatment, we encourage you to take your nausea medication as directed.  BELOW ARE SYMPTOMS THAT SHOULD BE REPORTED IMMEDIATELY: *FEVER GREATER THAN 100.4 F (38 C) OR HIGHER *CHILLS OR SWEATING *NAUSEA AND VOMITING THAT IS NOT CONTROLLED WITH YOUR NAUSEA MEDICATION *UNUSUAL SHORTNESS OF BREATH *UNUSUAL BRUISING OR BLEEDING *URINARY PROBLEMS (pain or burning when urinating, or frequent urination) *BOWEL PROBLEMS (unusual diarrhea, constipation, pain near the anus) TENDERNESS IN MOUTH AND THROAT WITH OR WITHOUT PRESENCE OF ULCERS (sore throat, sores in mouth, or a toothache) UNUSUAL RASH, SWELLING OR PAIN  UNUSUAL VAGINAL DISCHARGE OR ITCHING   Items with * indicate a potential emergency and should be followed up as soon as possible or go to the Emergency Department if any problems should occur.  Please show the CHEMOTHERAPY ALERT CARD or  IMMUNOTHERAPY ALERT CARD at check-in to the Emergency Department and triage nurse.  Should you have questions after your visit or need to cancel or reschedule your appointment, please contact [facility]  Dept: [phone]  and follow the prompts.  Office hours are 8:00 a.m. to 4:30 p.m. Monday - Friday. Please note that voicemails left after 4:00 p.m. may not be returned until the following business day.  We are closed weekends and major holidays. You have access to a nurse at all times for urgent questions. Please call the main number to the clinic Dept: [phone] and follow the prompts.   For any non-urgent questions, you may also contact your provider using MyChart. We now offer e-Visits for anyone 66 and older to request care online for non-urgent symptoms. For details visit [email or web address].   Also download the MyChart app! Go to the app store, search "MyChart", open the app, select [facility], and log in with your MyChart username and password.  Due to Covid, a mask is required upon entering the hospital/clinic. If you do not have a mask, one will be given to you upon arrival. For doctor visits, patients may have 1 support person aged 84 or older with them. For treatment visits, patients cannot have anyone with them due to current Covid guidelines and our immunocompromised population.    Influenza (Flu) Vaccine (Inactivated or Recombinant): What You Need to Know 1. Why get vaccinated? Influenza vaccine can prevent influenza (flu). Flu is a contagious disease that spreads around the [place] every year, usually between October and May. Anyone can get the  flu, but it is more dangerous for some people. Infants and young children, people 4 years and older, pregnant people, and people with certain health conditions or a weakened immune system are at greatest risk of flu complications. Pneumonia, bronchitis, sinus infections, and ear infections are  examples of flu-related complications. If you have a medical condition, such as heart disease, cancer, or diabetes, flu can make it worse. Flu can cause fever and chills, sore throat, muscle aches, fatigue, cough, headache, and runny or stuffy nose. Some people may have vomiting and diarrhea, though this is more common in children than adults. In an average year, thousands of people in the [place] die from flu, and many more are hospitalized. Flu vaccine prevents millions of illnesses and flu-related visits to the doctor each year. 2. Influenza vaccines CDC recommends everyone 6 months and older get vaccinated every flu season. Children 6 months through 110 years of age may need 2 doses during a single flu season. Everyone else needs only 1 dose each flu season. It takes about 2 weeks for protection to develop after vaccination. There are many flu viruses, and they are always changing. Each year a new flu vaccine is made to protect against the influenza viruses believed to be likely to cause disease in the upcoming flu season. Even when the vaccine doesn't exactly match these viruses, it may still provide some protection. Influenza vaccine does not cause flu. Influenza vaccine may be given at the same time as other vaccines. 3. Talk with your health care provider Tell your vaccination provider if the person getting the vaccine: Has had an allergic reaction after a previous dose of influenza vaccine, or has any severe, life-threatening allergies Has ever had Guillain-Barr Syndrome (also called "GBS") In some cases, your health care provider may decide to postpone influenza vaccination until a future visit. Influenza vaccine can be administered at any time during pregnancy. People who are or will be pregnant during influenza season should receive inactivated influenza vaccine. People with minor illnesses, such as a cold, may be vaccinated. People who are moderately or severely ill should usually  wait until they recover before getting influenza vaccine. Your health care provider can give you more information. 4. Risks of a vaccine reaction Soreness, redness, and swelling where the shot is given, fever, muscle aches, and headache can happen after influenza vaccination. There may be a very small increased risk of Guillain-Barr Syndrome (GBS) after inactivated influenza vaccine (the flu shot). Young children who get the flu shot along with pneumococcal vaccine (PCV13) and/or DTaP vaccine at the same time might be slightly more likely to have a seizure caused by fever. Tell your health care provider if a child who is getting flu vaccine has ever had a seizure. People sometimes faint after medical procedures, including vaccination. Tell your provider if you feel dizzy or have vision changes or ringing in the ears. As with any medicine, there is a very remote chance of a vaccine causing a severe allergic reaction, other serious injury, or death. 5. What if there is a serious problem? An allergic reaction could occur after the vaccinated person leaves the clinic. If you see signs of a severe allergic reaction (hives, swelling of the face and throat, difficulty breathing, a fast heartbeat, dizziness, or weakness), call 9-1-1 and get the person to the nearest hospital. For other signs that concern you, call your health care provider. Adverse reactions should be reported to the Vaccine Adverse Event Reporting System (VAERS). Your health  care provider will usually file this report, or you can do it yourself. Visit the VAERS website at www.vaers.SamedayNews.es or call 708-462-3074. VAERS is only for reporting reactions, and VAERS staff members do not give medical advice. 6. The National Vaccine Injury Compensation Program The [employer] Vaccine Injury Compensation Program (VICP) is a federal program that was created to compensate people who may have been injured by certain vaccines. Claims regarding alleged  injury or death due to vaccination have a time limit for filing, which may be as short as two years. Visit the VICP website at GoldCloset.com.ee or call (878) 369-0490 to learn about the program and about filing a claim. 7. How can I learn more? Ask your health care provider. Call your local or state health department. Visit the website of the Food and Drug Administration (FDA) for vaccine package inserts and additional information at TraderRating.uy. Contact the Centers for Disease Control and Prevention (CDC): Call 9523444370 (1-800-CDC-INFO) or Visit CDC's website at https://gibson.com/. Vaccine Information Statement Inactivated Influenza Vaccine (12/15/2019) This information is not intended to replace advice given to you by your health care provider. Make sure you discuss any questions you have with your health care provider. Document Revised: 02/01/2020 Document Reviewed: 02/01/2020 Elsevier Patient Education  2022 [employer].

## 2021-02-24 NOTE — Assessment & Plan Note (Signed)
She is taking high-dose vitamin D replacement therapy I will check her vitamin D level in her next visit

## 2021-02-24 NOTE — Assessment & Plan Note (Signed)
She denies significant side effects from recent treatment We will continue treatment as scheduled

## 2021-02-24 NOTE — Progress Notes (Signed)
[place] OFFICE PROGRESS NOTE  Patient Care Team: [name], MD as PCP - General (Family Medicine)  ASSESSMENT & PLAN:  Ovarian cancer, right [facility]) She denies significant side effects from recent treatment We will continue treatment as scheduled   Drug-induced hyperglycemia This is due to side effects of corticosteroid therapy We discussed dietary modification before treatment in the future  Vitamin D deficiency She is taking high-dose vitamin D replacement therapy I will check her vitamin D level in her next visit  No orders of the defined types were placed in this encounter.   All questions were answered. The patient knows to call the clinic with any problems, questions or concerns. The total time spent in the appointment was 20 minutes encounter with patients including review of chart and various tests results, discussions about plan of care and coordination of care plan   [name], MD 02/24/2021 9:24 AM  INTERVAL HISTORY: Please see below for problem oriented charting. she returns for treatment follow-up for cycle 2 of carboplatin and paclitaxel for ovarian cancer She tolerated last cycle of treatment better Denies peripheral neuropathy The cooling cap is working and she had minimal hair loss Denies significant changes in bowel habits or nausea No problems with superficial thrombophlebitis with her recent visit  REVIEW OF SYSTEMS:   Constitutional: Denies fevers, chills or abnormal weight loss Eyes: Denies blurriness of vision Ears, nose, mouth, throat, and face: Denies mucositis or sore throat Respiratory: Denies cough, dyspnea or wheezes Cardiovascular: Denies palpitation, chest discomfort or lower extremity swelling Gastrointestinal:  Denies nausea, heartburn or change in bowel habits Skin: Denies abnormal skin rashes Lymphatics: Denies new lymphadenopathy or easy bruising Neurological:Denies numbness, tingling or new  weaknesses Behavioral/Psych: Mood is stable, no new changes  All other systems were reviewed with the patient and are negative.  I have reviewed the past medical history, past surgical history, social history and family history with the patient and they are unchanged from previous note.  ALLERGIES:  is allergic to codeine.  MEDICATIONS:  Current Outpatient Medications  Medication Sig Dispense Refill   Calcium Carb-Cholecalciferol (CALCIUM 600+D3 PO) Take 2 tablets by mouth in the morning. (Patient not taking: Reported on 01/14/2021)     dexamethasone (DECADRON) 4 MG tablet Take 2 tabs at the night before and 2 tab the morning of chemotherapy, every 3 weeks, by mouth x 6 cycles 36 tablet 6   levothyroxine (SYNTHROID) 50 MCG tablet TAKE 1 TABLET BY MOUTH EVERY DAY 90 tablet 3   lidocaine-prilocaine (EMLA) cream Apply 1 application topically as needed. 30 g 0   ondansetron (ZOFRAN) 8 MG tablet Take 1 tablet (8 mg total) by mouth every 8 (eight) hours as needed for refractory nausea / vomiting. 30 tablet 1   prochlorperazine (COMPAZINE) 10 MG tablet Take 1 tablet (10 mg total) by mouth every 6 (six) hours as needed (Nausea or vomiting). 30 tablet 1   vitamin B-12 (CYANOCOBALAMIN) 1000 MCG tablet Take 1,000 mcg by mouth in the morning.     vitamin C (ASCORBIC ACID) 500 MG tablet Take 500 mg by mouth in the morning.     Vitamin D, Ergocalciferol, (DRISDOL) 1.25 MG (50000 UNIT) CAPS capsule Take 1 capsule (50,000 Units total) by mouth every 7 (seven) days. 12 capsule 0   No current facility-administered medications for this visit.    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 There is a large, cystic mass of the right ovary with peripheral nodular soft tissue components measuring at least  19.8 x 13.3 x 19.8 cm. Findings are consistent with primary ovarian malignancy. 2. Minimal stranding of the omentum in the ventral abdomen and minimal thickening of the peritoneum in the right lower quadrant in the right paracolic gutter without evidence of discrete omental or peritoneal nodularity. Findings are modestly suspicious for early peritoneal metastatic disease. 3. No lymphadenopathy or other evidence of metastatic disease in the abdomen or pelvis. 4. Polyp or adherent gallstone near the gallbladder fundus, better assessed by prior ultrasound. 5. Large burden of stool throughout the colon and rectum.   Aortic Atherosclerosis (ICD10-I70.0).   12/03/2020 Pathology Results   SURGICAL PATHOLOGY  CASE: [id number]  PATIENT: Brooke Khan  Surgical Pathology Report   Clinical History: Ovarian mass (crm)    FINAL MICROSCOPIC DIAGNOSIS:   A. UTERUS, CERVIX, BILATERAL FALLOPIAN TUBES AND OVARIES:  Ovary, right  -  Endometrioid carcinoma, FIGO grade 3  -  See oncology table and comment below   Ovary, left:  -  No carcinoma identified   Bilateral Fallopian tubes:  -  No carcinoma identified   Uterus:  -  No carcinoma identified   Cervix:  -  No carcinoma identified   B. LYMPH NODES, RIGHT PARAAORTIC, RESECTION:  -  No carcinoma identified in six lymph nodes (0/6)   C. LYMPH NODES, LEFT PARAAORTIC, RESECTION:  -  No carcinoma identified in five lymph nodes (0/5)   D. LYMPH NODES, RIGHT PELVIC, RESECTION:  -  No carcinoma identified in five lymph nodes (0/5)   E. LYMPH NODES, LEFT PELVIC, RESECTION:  -  No carcinoma identified in three lymph nodes (0/3)   F. OMENTUM, OMENTECTOMY:  -  No carcinoma identified   G. PERITONEUM, ANTERIOR PELVIC, BIOPSY:  -  No carcinoma identified   H. PERITONEUM, RIGHT PELVIC, BIOPSY:  -  No carcinoma identified   I. PERITONEUM, LEFT PELVIC, BIOPSY:  -  No carcinoma identified   J. PERITONEUM, POSTERIOR PELVIC, BIOPSY:  -  No  carcinoma identified   K. PERITONEUM, RIGHT ABDOMINAL, BIOPSY:  -  No carcinoma identified   L. PERITONEUM, LEFT ABDOMINAL, BIOPSY:  -  No carcinoma identified    ONCOLOGY TABLE:   OVARY or FALLOPIAN TUBE or PRIMARY PERITONEUM: Resection   Procedure: Hysterectomy with bilateral salpingo-oophorectomy  Specimen Integrity: Intact  Tumor Site: Ovary, right  Tumor Size: 1.5 cm  Histologic Type: Endometrioid carcinoma  Histologic Grade: FIGO grade 3  Ovarian Surface Involvement: Not identified  Fallopian Tube Surface Involvement: Not identified  Other Tissue/ Organ Involvement: Not applicable  Largest Extrapelvic Peritoneal Focus: Not applicable  Peritoneal/Ascitic Fluid Involvement: Malignant cells not identified  Chemotherapy Response Score (CRS): Not applicable, no known presurgical therapy  Regional Lymph Nodes:       Number of Nodes with Metastasis Greater than 10 mm: 0       Number of Nodes with Metastasis 10 mm or Less (excludes isolated  tumor cells): 0       Number of Nodes with Isolated Tumor Cells (0.2 mm or less): 0       Number of Lymph Nodes Examined: 19  Distant Metastasis:       Distant Site(s) Involved: Not applicable  Pathologic Stage Classification (pTNM, AJCC 8th Edition): pT1a, pN0  Ancillary Studies:  Can be performed upon request  Representative Tumor Block: A8  Comment(s): Dr. Vic Ripper reviewed the case and agrees with the above diagnosis.     12/03/2020 Surgery   Surgeon: Donaciano Eva    Operation: Robotic-assisted laparoscopic total hysterectomy with bilateral salpingoophorectomy, omentectomy, pelvic and para-aortic lymphadnectomy.     Surgeon: Donaciano Eva    Operative Findings:  : 20cm cystic mass, smooth. Attachments between inferior aspect of cystic mass and posterior pelvic peritoneum. Normal appearing omentum and nodes. Invasive malignancy on evaluation. No gross extra-ovarian disease. Clinical stage I disease. No gross residual  disease.    12/11/2020 Cancer Staging   Staging form: Ovary, Fallopian Tube, and Primary Peritoneal Carcinoma, AJCC 8th Edition - Pathologic stage from 12/11/2020: FIGO Stage IA (pT1a, pN0, cM0) - Signed by [name], MD on 12/11/2020 Stage prefix: Initial diagnosis   01/10/2021 Genetic Testing   Negative germline and somatic genetic testing: no pathogenic variants detected in [employer] MyRisk Panel.   No somatic variants detected in [employer] HRD testing.  Genomic instaiblity score is negative.  The report dates are January 10, 2021.    The [facility] gene panel offered by [employer] includes sequencing and deletion/duplication testing of the following 48 genes: APC, ATM, AXIN2, BAP1, BARD1, BMPR1A, BRCA1, BRCA2, BRIP1, CHD1, CDK4, CDKN2A(p16 and p14ARF), CHEK2, CTNNA1, EGFR, EPCAM, FH, FLCN, GREM1, HOXB13, MEN1, MET, MITF , MLH1, MSH2, MSH3, MSH6, MUTYH, NTHL1, PALB2, PMS2, POLD1, POLE, PTEN, RAD51C, RAD51D, RET, SDHA, SDHB, SDHC, SDHD, SMAD4, STK11,TERT, TP53, TSC1, TSC2, and VHL.    01/15/2021 -  Chemotherapy   Patient is on Treatment Plan : OVARIAN Carboplatin (AUC 6) / Paclitaxel (175) q21d x 6 cycles       PHYSICAL EXAMINATION: ECOG PERFORMANCE STATUS: 0 - Asymptomatic  Vitals:   02/24/21 0855  BP: (!) 155/73  Pulse: 86  Resp: 18  Temp: 98.1 F (36.7 C)  SpO2: 98%   Filed Weights   02/24/21 0855  Weight: 163 lb (73.9 kg)    GENERAL:alert, no distress and comfortable SKIN: skin color, texture, turgor are normal, no rashes or significant lesions EYES: normal, Conjunctiva are pink and non-injected, sclera clear OROPHARYNX:no exudate, no erythema and lips, buccal mucosa, and tongue normal  NECK: supple, thyroid normal size, non-tender, without nodularity LYMPH:  no palpable lymphadenopathy in the cervical, axillary or inguinal LUNGS: clear to auscultation and percussion with normal breathing effort HEART: regular rate & rhythm and no murmurs and no lower  extremity edema ABDOMEN:abdomen soft, non-tender and normal bowel sounds Musculoskeletal:no cyanosis of digits and no clubbing  NEURO: alert & oriented x 3 with fluent speech, no focal motor/sensory deficits  LABORATORY DATA:  I have reviewed the data as listed    Component Value Date/Time   NA 143 02/24/2021 0833   K 4.0 02/24/2021 0833   CL 110 02/24/2021 0833   CO2 20 (L) 02/24/2021 0833   GLUCOSE 189 (H) 02/24/2021 0833   BUN 18 02/24/2021 0833   CREATININE 0.80 02/24/2021 0833   CALCIUM 10.1 02/24/2021 0833   PROT 8.0 02/24/2021 0833   ALBUMIN 4.2 02/24/2021 0833   AST 17 02/24/2021 0833   ALT 31 02/24/2021 0833   ALKPHOS 94 02/24/2021 0833   BILITOT 0.3 02/24/2021 0833   GFRNONAA >60 02/24/2021 0833    No results found for: SPEP, UPEP  Lab Results  Component Value Date   WBC 7.7 02/24/2021   NEUTROABS 6.3 02/24/2021   HGB 13.8 02/24/2021   HCT 39.9  02/24/2021   MCV 89.1 02/24/2021   PLT 239 02/24/2021      Chemistry      Component Value Date/Time   NA 143 02/24/2021 0833   K 4.0 02/24/2021 0833   CL 110 02/24/2021 0833   CO2 20 (L) 02/24/2021 0833   BUN 18 02/24/2021 0833   CREATININE 0.80 02/24/2021 0833      Component Value Date/Time   CALCIUM 10.1 02/24/2021 0833   ALKPHOS 94 02/24/2021 0833   AST 17 02/24/2021 0833   ALT 31 02/24/2021 0833   BILITOT 0.3 02/24/2021 7741

## 2021-02-25 ENCOUNTER — Ambulatory Visit: Payer: BC Managed Care – PPO | Admitting: Cardiology

## 2021-03-12 ENCOUNTER — Encounter: Payer: Self-pay | Admitting: Hematology and Oncology

## 2021-03-13 ENCOUNTER — Telehealth: Payer: Self-pay | Admitting: Oncology

## 2021-03-13 ENCOUNTER — Other Ambulatory Visit: Payer: Self-pay

## 2021-03-13 ENCOUNTER — Inpatient Hospital Stay: Payer: BC Managed Care – PPO | Attending: Gynecologic Oncology | Admitting: Hematology and Oncology

## 2021-03-13 ENCOUNTER — Encounter: Payer: Self-pay | Admitting: Hematology and Oncology

## 2021-03-13 DIAGNOSIS — L03818 Cellulitis of other sites: Secondary | ICD-10-CM | POA: Insufficient documentation

## 2021-03-13 DIAGNOSIS — Z90710 Acquired absence of both cervix and uterus: Secondary | ICD-10-CM | POA: Diagnosis not present

## 2021-03-13 DIAGNOSIS — Z5111 Encounter for antineoplastic chemotherapy: Secondary | ICD-10-CM | POA: Diagnosis not present

## 2021-03-13 DIAGNOSIS — L0390 Cellulitis, unspecified: Secondary | ICD-10-CM | POA: Diagnosis not present

## 2021-03-13 DIAGNOSIS — Z90722 Acquired absence of ovaries, bilateral: Secondary | ICD-10-CM | POA: Insufficient documentation

## 2021-03-13 DIAGNOSIS — C561 Malignant neoplasm of right ovary: Secondary | ICD-10-CM | POA: Diagnosis not present

## 2021-03-13 DIAGNOSIS — I700 Atherosclerosis of aorta: Secondary | ICD-10-CM | POA: Diagnosis not present

## 2021-03-13 DIAGNOSIS — Z79899 Other long term (current) drug therapy: Secondary | ICD-10-CM | POA: Diagnosis not present

## 2021-03-13 DIAGNOSIS — E559 Vitamin D deficiency, unspecified: Secondary | ICD-10-CM | POA: Diagnosis not present

## 2021-03-13 MED ORDER — SULFAMETHOXAZOLE-TRIMETHOPRIM 800-160 MG PO TABS: 1.0000 | ORAL_TABLET | Freq: Two times a day (BID) | ORAL | 0 refills | Status: DC

## 2021-03-13 NOTE — Assessment & Plan Note (Signed)
She has definitive signs of cellulitis on her left forearm close to previous injection site I recommend a course of antibiotic with Bactrim to cover for potential MRSA She has appointment to return early next week for her next cycle of treatment and I am hopeful this will resolve by then We discussed the risk and benefits of Bactrim and she is in agreement to proceed

## 2021-03-13 NOTE — Telephone Encounter (Signed)
Brooke Khan and scheduled appointment with Dr. Alvy Bimler at 12:00 today.

## 2021-03-13 NOTE — Progress Notes (Signed)
[place] OFFICE PROGRESS NOTE  Patient Care Team: [name], MD as PCP - General (Family Medicine)  ASSESSMENT & PLAN:  Cellulitis at injection site She has definitive signs of cellulitis on her left forearm close to previous injection site I recommend a course of antibiotic with Bactrim to cover for potential MRSA She has appointment to return early next week for her next cycle of treatment and I am hopeful this will resolve by then We discussed the risk and benefits of Bactrim and she is in agreement to proceed  No orders of the defined types were placed in this encounter.   All questions were answered. The patient knows to call the clinic with any problems, questions or concerns. The total time spent in the appointment was 20 minutes encounter with patients including review of chart and various tests results, discussions about plan of care and coordination of care plan   [name], MD 03/13/2021 3:53 PM  INTERVAL HISTORY: Please see below for problem oriented charting. she returns for urgent evaluation Since yesterday, she noted significant soft tissue swelling and erythematous changes on the left forearm She denies fever or chills She sent a picture over my chart message this morning and she is worked in for urgent evaluation  REVIEW OF SYSTEMS:   Constitutional: Denies fevers, chills or abnormal weight loss Eyes: Denies blurriness of vision Ears, nose, mouth, throat, and face: Denies mucositis or sore throat Respiratory: Denies cough, dyspnea or wheezes Cardiovascular: Denies palpitation, chest discomfort or lower extremity swelling Gastrointestinal:  Denies nausea, heartburn or change in bowel habits Lymphatics: Denies new lymphadenopathy or easy bruising Neurological:Denies numbness, tingling or new weaknesses Behavioral/Psych: Mood is stable, no new changes  All other systems were reviewed with the patient and are negative.  I have reviewed  the past medical history, past surgical history, social history and family history with the patient and they are unchanged from previous note.  ALLERGIES:  is allergic to codeine.  MEDICATIONS:  Current Outpatient Medications  Medication Sig Dispense Refill   sulfamethoxazole-trimethoprim (BACTRIM DS) 800-160 MG tablet Take 1 tablet by mouth 2 (two) times daily. 14 tablet 0   Calcium Carb-Cholecalciferol (CALCIUM 600+D3 PO) Take 2 tablets by mouth in the morning. (Patient not taking: Reported on 01/14/2021)     dexamethasone (DECADRON) 4 MG tablet Take 2 tabs at the night before and 2 tab the morning of chemotherapy, every 3 weeks, by mouth x 6 cycles 36 tablet 6   levothyroxine (SYNTHROID) 50 MCG tablet TAKE 1 TABLET BY MOUTH EVERY DAY 90 tablet 3   lidocaine-prilocaine (EMLA) cream Apply 1 application topically as needed. 30 g 0   ondansetron (ZOFRAN) 8 MG tablet Take 1 tablet (8 mg total) by mouth every 8 (eight) hours as needed for refractory nausea / vomiting. 30 tablet 1   prochlorperazine (COMPAZINE) 10 MG tablet Take 1 tablet (10 mg total) by mouth every 6 (six) hours as needed (Nausea or vomiting). 30 tablet 1   vitamin B-12 (CYANOCOBALAMIN) 1000 MCG tablet Take 1,000 mcg by mouth in the morning.     vitamin C (ASCORBIC ACID) 500 MG tablet Take 500 mg by mouth in the morning.     Vitamin D, Ergocalciferol, (DRISDOL) 1.25 MG (50000 UNIT) CAPS capsule Take 1 capsule (50,000 Units total) by mouth every 7 (seven) days. 12 capsule 0   No current facility-administered medications for this visit.    SUMMARY OF ONCOLOGIC HISTORY: Oncology History Overview Note  Endometrioid cancer  FIGO grade 3 Neg genetics   Ovarian cancer, right [facility])   Initial Diagnosis   Ovarian cancer, right ([place])   11/20/2020 Imaging   CT scan of abdomen and pelvis  1. There is a large, cystic mass of the right ovary with peripheral nodular soft tissue components measuring at least 19.8 x 13.3 x 19.8 cm. Findings  are consistent with primary ovarian malignancy. 2. Minimal stranding of the omentum in the ventral abdomen and minimal thickening of the peritoneum in the right lower quadrant in the right paracolic gutter without evidence of discrete omental or peritoneal nodularity. Findings are modestly suspicious for early peritoneal metastatic disease. 3. No lymphadenopathy or other evidence of metastatic disease in the abdomen or pelvis. 4. Polyp or adherent gallstone near the gallbladder fundus, better assessed by prior ultrasound. 5. Large burden of stool throughout the colon and rectum.   Aortic Atherosclerosis (ICD10-I70.0).   12/03/2020 Pathology Results   SURGICAL PATHOLOGY  CASE: [id number]  PATIENT: [name]  Surgical Pathology Report   Clinical History: Ovarian mass (crm)    FINAL MICROSCOPIC DIAGNOSIS:   A. UTERUS, CERVIX, BILATERAL FALLOPIAN TUBES AND OVARIES:  Ovary, right  -  Endometrioid carcinoma, FIGO grade 3  -  See oncology table and comment below   Ovary, left:  -  No carcinoma identified   Bilateral Fallopian tubes:  -  No carcinoma identified   Uterus:  -  No carcinoma identified   Cervix:  -  No carcinoma identified   B. LYMPH NODES, RIGHT PARAAORTIC, RESECTION:  -  No carcinoma identified in six lymph nodes (0/6)   C. LYMPH NODES, LEFT PARAAORTIC, RESECTION:  -  No carcinoma identified in five lymph nodes (0/5)   D. LYMPH NODES, RIGHT PELVIC, RESECTION:  -  No carcinoma identified in five lymph nodes (0/5)   E. LYMPH NODES, LEFT PELVIC, RESECTION:  -  No carcinoma identified in three lymph nodes (0/3)   F. OMENTUM, OMENTECTOMY:  -  No carcinoma identified   G. PERITONEUM, ANTERIOR PELVIC, BIOPSY:  -  No carcinoma identified   H. PERITONEUM, RIGHT PELVIC, BIOPSY:  -  No carcinoma identified   I. PERITONEUM, LEFT PELVIC, BIOPSY:  -  No carcinoma identified   J. PERITONEUM, POSTERIOR PELVIC, BIOPSY:  -  No carcinoma identified   K.  PERITONEUM, RIGHT ABDOMINAL, BIOPSY:  -  No carcinoma identified   L. PERITONEUM, LEFT ABDOMINAL, BIOPSY:  -  No carcinoma identified    ONCOLOGY TABLE:   OVARY or FALLOPIAN TUBE or PRIMARY PERITONEUM: Resection   Procedure: Hysterectomy with bilateral salpingo-oophorectomy  Specimen Integrity: Intact  Tumor Site: Ovary, right  Tumor Size: 1.5 cm  Histologic Type: Endometrioid carcinoma  Histologic Grade: FIGO grade 3  Ovarian Surface Involvement: Not identified  Fallopian Tube Surface Involvement: Not identified  Other Tissue/ Organ Involvement: Not applicable  Largest Extrapelvic Peritoneal Focus: Not applicable  Peritoneal/Ascitic Fluid Involvement: Malignant cells not identified  Chemotherapy Response Score (CRS): Not applicable, no known presurgical therapy  Regional Lymph Nodes:       Number of Nodes with Metastasis Greater than 10 mm: 0       Number of Nodes with Metastasis 10 mm or Less (excludes isolated  tumor cells): 0       Number of Nodes with Isolated Tumor Cells (0.2 mm or less): 0       Number of Lymph Nodes Examined: 19  Distant Metastasis:       Distant Site(s) Involved: Not applicable  Pathologic Stage Classification (pTNM, AJCC 8th Edition): pT1a, pN0  Ancillary Studies: Can be performed upon request  Representative Tumor Block: A8  Comment(s): Dr. Vic Ripper reviewed the case and agrees with the above diagnosis.     12/03/2020 Surgery   Surgeon: Donaciano Eva    Operation: Robotic-assisted laparoscopic total hysterectomy with bilateral salpingoophorectomy, omentectomy, pelvic and para-aortic lymphadnectomy.     Surgeon: Donaciano Eva    Operative Findings:  : 20cm cystic mass, smooth. Attachments between inferior aspect of cystic mass and posterior pelvic peritoneum. Normal appearing omentum and nodes. Invasive malignancy on evaluation. No gross extra-ovarian disease. Clinical stage I disease. No gross residual disease.    12/11/2020  Cancer Staging   Staging form: Ovary, Fallopian Tube, and Primary Peritoneal Carcinoma, AJCC 8th Edition - Pathologic stage from 12/11/2020: FIGO Stage IA (pT1a, pN0, cM0) - Signed by [name], MD on 12/11/2020 Stage prefix: Initial diagnosis    01/10/2021 Genetic Testing   Negative germline and somatic genetic testing: no pathogenic variants detected in [employer] MyRisk Panel.   No somatic variants detected in [employer] HRD testing.  Genomic instaiblity score is negative.  The report dates are January 10, 2021.    The [facility] gene panel offered by [employer] includes sequencing and deletion/duplication testing of the following 48 genes: APC, ATM, AXIN2, BAP1, BARD1, BMPR1A, BRCA1, BRCA2, BRIP1, CHD1, CDK4, CDKN2A(p16 and p14ARF), CHEK2, CTNNA1, EGFR, EPCAM, FH, FLCN, GREM1, HOXB13, MEN1, MET, MITF , MLH1, MSH2, MSH3, MSH6, MUTYH, NTHL1, PALB2, PMS2, POLD1, POLE, PTEN, RAD51C, RAD51D, RET, SDHA, SDHB, SDHC, SDHD, SMAD4, STK11,TERT, TP53, TSC1, TSC2, and VHL.    01/15/2021 -  Chemotherapy   Patient is on Treatment Plan : OVARIAN Carboplatin (AUC 6) / Paclitaxel (175) q21d x 6 cycles       PHYSICAL EXAMINATION: ECOG PERFORMANCE STATUS: 1 - Symptomatic but completely ambulatory  Vitals:   03/13/21 1209  BP: (!) 153/65  Pulse: 67  Resp: 18  Temp: 98 F (36.7 C)  SpO2: 100%   Filed Weights   03/13/21 1209  Weight: 167 lb 6.4 oz (75.9 kg)    GENERAL:alert, no distress and comfortable SKIN: Noted Soft tissue swelling and surrounding erythema on her left forearm NEURO: alert & oriented x 3 with fluent speech, no focal motor/sensory deficits  LABORATORY DATA:  I have reviewed the data as listed    Component Value Date/Time   NA 143 02/24/2021 0833   K 4.0 02/24/2021 0833   CL 110 02/24/2021 0833   CO2 20 (L) 02/24/2021 0833   GLUCOSE 189 (H) 02/24/2021 0833   BUN 18 02/24/2021 0833   CREATININE 0.80 02/24/2021 0833   CALCIUM 10.1 02/24/2021 0833   PROT 8.0  02/24/2021 0833   ALBUMIN 4.2 02/24/2021 0833   AST 17 02/24/2021 0833   ALT 31 02/24/2021 0833   ALKPHOS 94 02/24/2021 0833   BILITOT 0.3 02/24/2021 0833   GFRNONAA >60 02/24/2021 0833    No results found for: SPEP, UPEP  Lab Results  Component Value Date   WBC 7.7 02/24/2021   NEUTROABS 6.3 02/24/2021   HGB 13.8 02/24/2021   HCT 39.9 02/24/2021   MCV 89.1 02/24/2021   PLT 239 02/24/2021      Chemistry      Component Value Date/Time   NA 143 02/24/2021 0833   K 4.0 02/24/2021 0833   CL 110 02/24/2021 0833   CO2 20 (L) 02/24/2021 0833   BUN 18 02/24/2021 0833   CREATININE 0.80  02/24/2021 0833      Component Value Date/Time   CALCIUM 10.1 02/24/2021 0833   ALKPHOS 94 02/24/2021 0833   AST 17 02/24/2021 0833   ALT 31 02/24/2021 0833   BILITOT 0.3 02/24/2021 [id number]

## 2021-03-14 ENCOUNTER — Telehealth: Payer: Self-pay

## 2021-03-14 MED FILL — Fosaprepitant Dimeglumine For IV Infusion 150 MG (Base Eq): INTRAVENOUS | Qty: 5 | Status: AC

## 2021-03-14 MED FILL — Dexamethasone Sodium Phosphate Inj 100 MG/10ML: INTRAMUSCULAR | Qty: 1 | Status: AC

## 2021-03-14 NOTE — Telephone Encounter (Signed)
Returned her call. She sent a mychart message and wants to make sure Dr. Alvy Bimler see it. Forwarded message to Dr. Alvy Bimler.

## 2021-03-17 ENCOUNTER — Encounter: Payer: Self-pay | Admitting: Hematology and Oncology

## 2021-03-17 ENCOUNTER — Inpatient Hospital Stay: Payer: BC Managed Care – PPO

## 2021-03-17 ENCOUNTER — Telehealth: Payer: Self-pay

## 2021-03-17 ENCOUNTER — Inpatient Hospital Stay (HOSPITAL_BASED_OUTPATIENT_CLINIC_OR_DEPARTMENT_OTHER): Payer: BC Managed Care – PPO | Admitting: Hematology and Oncology

## 2021-03-17 ENCOUNTER — Other Ambulatory Visit: Payer: Self-pay

## 2021-03-17 DIAGNOSIS — L0390 Cellulitis, unspecified: Secondary | ICD-10-CM | POA: Diagnosis not present

## 2021-03-17 DIAGNOSIS — E559 Vitamin D deficiency, unspecified: Secondary | ICD-10-CM

## 2021-03-17 DIAGNOSIS — C561 Malignant neoplasm of right ovary: Secondary | ICD-10-CM

## 2021-03-17 DIAGNOSIS — Z5111 Encounter for antineoplastic chemotherapy: Secondary | ICD-10-CM | POA: Diagnosis not present

## 2021-03-17 DIAGNOSIS — Z90722 Acquired absence of ovaries, bilateral: Secondary | ICD-10-CM | POA: Diagnosis not present

## 2021-03-17 DIAGNOSIS — Z90710 Acquired absence of both cervix and uterus: Secondary | ICD-10-CM | POA: Diagnosis not present

## 2021-03-17 DIAGNOSIS — Z79899 Other long term (current) drug therapy: Secondary | ICD-10-CM | POA: Diagnosis not present

## 2021-03-17 DIAGNOSIS — L03818 Cellulitis of other sites: Secondary | ICD-10-CM | POA: Diagnosis not present

## 2021-03-17 DIAGNOSIS — I700 Atherosclerosis of aorta: Secondary | ICD-10-CM | POA: Diagnosis not present

## 2021-03-17 LAB — CMP (CANCER CENTER ONLY)
ALT: 25 U/L (ref 0–44)
AST: 16 U/L (ref 15–41)
Albumin: 4.2 g/dL (ref 3.5–5.0)
Alkaline Phosphatase: 95 U/L (ref 38–126)
Anion gap: 11 (ref 5–15)
BUN: 17 mg/dL (ref 6–20)
CO2: 21 mmol/L — ABNORMAL LOW (ref 22–32)
Calcium: 9.7 mg/dL (ref 8.9–10.3)
Chloride: 110 mmol/L (ref 98–111)
Creatinine: 0.83 mg/dL (ref 0.44–1.00)
GFR, Estimated: >60 mL/min (ref >60)
Glucose, Bld: 141 mg/dL — ABNORMAL HIGH (ref 70–99)
Potassium: 3.9 mmol/L (ref 3.5–5.1)
Sodium: 142 mmol/L (ref 135–145)
Total Bilirubin: 0.2 mg/dL — ABNORMAL LOW (ref 0.3–1.2)
Total Protein: 8.1 g/dL (ref 6.5–8.1)

## 2021-03-17 LAB — CBC WITH DIFFERENTIAL (CANCER CENTER ONLY)
Abs Immature Granulocytes: 0.06 10*3/uL (ref 0.00–0.07)
Basophils Absolute: 0 10*3/uL (ref 0.0–0.1)
Basophils Relative: 0 %
Eosinophils Absolute: 0 10*3/uL (ref 0.0–0.5)
Eosinophils Relative: 0 %
HCT: 39.9 % (ref 36.0–46.0)
Hemoglobin: 13.7 g/dL (ref 12.0–15.0)
Immature Granulocytes: 1 %
Lymphocytes Relative: 13 %
Lymphs Abs: 1.2 10*3/uL (ref 0.7–4.0)
MCH: 31 pg (ref 26.0–34.0)
MCHC: 34.3 g/dL (ref 30.0–36.0)
MCV: 90.3 fL (ref 80.0–100.0)
Monocytes Absolute: 0.2 10*3/uL (ref 0.1–1.0)
Monocytes Relative: 2 %
Neutro Abs: 7.8 10*3/uL — ABNORMAL HIGH (ref 1.7–7.7)
Neutrophils Relative %: 84 %
Platelet Count: 250 10*3/uL (ref 150–400)
RBC: 4.42 MIL/uL (ref 3.87–5.11)
RDW: 15.4 % (ref 11.5–15.5)
WBC Count: 9.3 10*3/uL (ref 4.0–10.5)
nRBC: 0 % (ref 0.0–0.2)

## 2021-03-17 LAB — VITAMIN D 25 HYDROXY (VIT D DEFICIENCY, FRACTURES): Vit D, 25-Hydroxy: 42.64 ng/mL (ref 30–100)

## 2021-03-17 MED ORDER — SODIUM CHLORIDE 0.9 % IV SOLN
680.0000 mg | Freq: Once | INTRAVENOUS | Status: AC
  Administered 2021-03-17: 680 mg via INTRAVENOUS
  Filled 2021-03-17: qty 68

## 2021-03-17 MED ORDER — FAMOTIDINE 20 MG IN NS 100 ML IVPB
20.0000 mg | Freq: Once | INTRAVENOUS | Status: AC
  Administered 2021-03-17: 20 mg via INTRAVENOUS
  Filled 2021-03-17: qty 100

## 2021-03-17 MED ORDER — SODIUM CHLORIDE 0.9 % IV SOLN
150.0000 mg | Freq: Once | INTRAVENOUS | Status: AC
  Administered 2021-03-17: 150 mg via INTRAVENOUS
  Filled 2021-03-17: qty 150

## 2021-03-17 MED ORDER — DIPHENHYDRAMINE HCL 50 MG/ML IJ SOLN
50.0000 mg | Freq: Once | INTRAMUSCULAR | Status: AC
  Administered 2021-03-17: 50 mg via INTRAVENOUS
  Filled 2021-03-17: qty 1

## 2021-03-17 MED ORDER — SODIUM CHLORIDE 0.9 % IV SOLN
175.0000 mg/m2 | Freq: Once | INTRAVENOUS | Status: AC
  Administered 2021-03-17: 312 mg via INTRAVENOUS
  Filled 2021-03-17: qty 52

## 2021-03-17 MED ORDER — PALONOSETRON HCL INJECTION 0.25 MG/5ML
0.2500 mg | Freq: Once | INTRAVENOUS | Status: AC
  Administered 2021-03-17: 0.25 mg via INTRAVENOUS
  Filled 2021-03-17: qty 5

## 2021-03-17 MED ORDER — SODIUM CHLORIDE 0.9 % IV SOLN: Freq: Once | INTRAVENOUS | Status: AC

## 2021-03-17 MED ORDER — SODIUM CHLORIDE 0.9 % IV SOLN
10.0000 mg | Freq: Once | INTRAVENOUS | Status: AC
  Administered 2021-03-17: 10 mg via INTRAVENOUS
  Filled 2021-03-17: qty 10

## 2021-03-17 NOTE — Progress Notes (Signed)
[name]  Patient Care Team: [name], MD as PCP - General (Family Medicine)  ASSESSMENT & PLAN:  Ovarian cancer, right [facility]) She denies significant side effects from recent treatment except for poor venous access and superficial thrombophlebitis Her recent cellulitis is improving on antibiotics We will continue treatment as scheduled If she continues to have difficulties with venous access, we will discontinue future treatment   Vitamin D deficiency Test results are pending We discussed nutritional supplement  Cellulitis at injection site This is resolving She will complete the course of antibiotics  No orders of the defined types were placed in this encounter.   All questions were answered. The patient knows to call the clinic with any problems, questions or concerns. The total time spent in the appointment was 20 minutes encounter with patients including review of chart and various tests results, discussions about plan of care and coordination of care plan   [name], MD 03/17/2021 1:42 PM  INTERVAL HISTORY: Please see below for problem oriented charting. she returns for treatment follow-up seen prior to cycle 4 of treatment Her cellulitis is improving She is wondering about future plan of care Denies peripheral neuropathy  REVIEW OF SYSTEMS:   Constitutional: Denies fevers, chills or abnormal weight loss Eyes: Denies blurriness of vision Ears, nose, mouth, throat, and face: Denies mucositis or sore throat Respiratory: Denies cough, dyspnea or wheezes Cardiovascular: Denies palpitation, chest discomfort or lower extremity swelling Gastrointestinal:  Denies nausea, heartburn or change in bowel habits Lymphatics: Denies new lymphadenopathy or easy bruising Neurological:Denies numbness, tingling or new weaknesses Behavioral/Psych: Mood is stable, no new changes  All other systems were reviewed with the patient and are  negative.  I have reviewed the past medical history, past surgical history, social history and family history with the patient and they are unchanged from previous note.  ALLERGIES:  is allergic to codeine.  MEDICATIONS:  Current Outpatient Medications  Medication Sig Dispense Refill   Calcium Carb-Cholecalciferol (CALCIUM 600+D3 PO) Take 2 tablets by mouth in the morning. (Patient not taking: Reported on 01/14/2021)     dexamethasone (DECADRON) 4 MG tablet Take 2 tabs at the night before and 2 tab the morning of chemotherapy, every 3 weeks, by mouth x 6 cycles 36 tablet 6   levothyroxine (SYNTHROID) 50 MCG tablet TAKE 1 TABLET BY MOUTH EVERY DAY 90 tablet 3   lidocaine-prilocaine (EMLA) cream Apply 1 application topically as needed. 30 g 0   ondansetron (ZOFRAN) 8 MG tablet Take 1 tablet (8 mg total) by mouth every 8 (eight) hours as needed for refractory nausea / vomiting. 30 tablet 1   prochlorperazine (COMPAZINE) 10 MG tablet Take 1 tablet (10 mg total) by mouth every 6 (six) hours as needed (Nausea or vomiting). 30 tablet 1   sulfamethoxazole-trimethoprim (BACTRIM DS) 800-160 MG tablet Take 1 tablet by mouth 2 (two) times daily. 14 tablet 0   vitamin B-12 (CYANOCOBALAMIN) 1000 MCG tablet Take 1,000 mcg by mouth in the morning.     vitamin C (ASCORBIC ACID) 500 MG tablet Take 500 mg by mouth in the morning.     Vitamin D, Ergocalciferol, (DRISDOL) 1.25 MG (50000 UNIT) CAPS capsule Take 1 capsule (50,000 Units total) by mouth every 7 (seven) days. 12 capsule 0   No current facility-administered medications for this visit.   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 There is a large, cystic mass of the right ovary with peripheral nodular soft tissue components measuring at least 19.8 x 13.3 x 19.8 cm. Findings are consistent with primary ovarian malignancy. 2. Minimal stranding of the omentum in the ventral abdomen and minimal thickening of the peritoneum in the right lower quadrant in the right paracolic gutter without evidence of discrete omental or peritoneal nodularity. Findings are modestly suspicious for early peritoneal metastatic disease. 3. No lymphadenopathy or other evidence of metastatic disease in the abdomen or pelvis. 4. Polyp or adherent gallstone near the gallbladder fundus, better assessed by prior ultrasound. 5. Large burden of stool throughout the colon and rectum.   Aortic Atherosclerosis (ICD10-I70.0).   12/03/2020 Pathology Results   SURGICAL PATHOLOGY  CASE: [id number]  PATIENT: Brooke Khan Surgical Pathology Report   Clinical History: Ovarian mass (crm)    FINAL MICROSCOPIC DIAGNOSIS:   A. UTERUS, CERVIX, BILATERAL FALLOPIAN TUBES AND OVARIES:  Ovary, right  -  Endometrioid carcinoma, FIGO grade 3  -  See oncology table and comment below   Ovary, left:  -  No carcinoma identified   Bilateral Fallopian tubes:  -  No carcinoma identified   Uterus:  -  No carcinoma identified   Cervix:  -  No carcinoma identified   B. LYMPH NODES, RIGHT PARAAORTIC, RESECTION:  -  No carcinoma identified in six lymph nodes (0/6)   C. LYMPH NODES, LEFT PARAAORTIC, RESECTION:  -  No carcinoma identified in five lymph nodes (0/5)   D. LYMPH NODES, RIGHT PELVIC, RESECTION:  -  No carcinoma  identified in five lymph nodes (0/5)   E. LYMPH NODES, LEFT PELVIC, RESECTION:  -  No carcinoma identified in three lymph nodes (0/3)   F. OMENTUM, OMENTECTOMY:  -  No carcinoma identified   G. PERITONEUM, ANTERIOR PELVIC, BIOPSY:  -  No carcinoma identified   H. PERITONEUM, RIGHT PELVIC, BIOPSY:  -  No carcinoma identified   I. PERITONEUM, LEFT PELVIC, BIOPSY:  -  No carcinoma identified   J. PERITONEUM, POSTERIOR PELVIC, BIOPSY:  -  No carcinoma identified   K. PERITONEUM, RIGHT ABDOMINAL, BIOPSY:  -  No carcinoma identified   L. PERITONEUM, LEFT ABDOMINAL, BIOPSY:  -  No carcinoma identified    ONCOLOGY TABLE:   OVARY or FALLOPIAN TUBE or PRIMARY PERITONEUM: Resection   Procedure: Hysterectomy with bilateral salpingo-oophorectomy  Specimen Integrity: Intact  Tumor Site: Ovary, right  Tumor Size: 1.5 cm  Histologic Type: Endometrioid carcinoma  Histologic Grade: FIGO grade 3  Ovarian Surface Involvement: Not identified  Fallopian Tube Surface Involvement: Not identified  Other Tissue/ Organ Involvement: Not applicable  Largest Extrapelvic Peritoneal Focus: Not applicable  Peritoneal/Ascitic Fluid Involvement: Malignant cells not identified  Chemotherapy Response Score (CRS): Not applicable, no known presurgical therapy  Regional Lymph Nodes:       Number of Nodes  with Metastasis Greater than 10 mm: 0       Number of Nodes with Metastasis 10 mm or Less (excludes isolated  tumor cells): 0       Number of Nodes with Isolated Tumor Cells (0.2 mm or less): 0       Number of Lymph Nodes Examined: 19  Distant Metastasis:       Distant Site(s) Involved: Not applicable  Pathologic Stage Classification (pTNM, AJCC 8th Edition): pT1a, pN0  Ancillary Studies: Can be performed upon request  Representative Tumor Block: A8  Comment(s): Dr. Vic Ripper reviewed the case and agrees with the above diagnosis.     12/03/2020 Surgery   Surgeon: Donaciano Eva    Operation:  Robotic-assisted laparoscopic total hysterectomy with bilateral salpingoophorectomy, omentectomy, pelvic and para-aortic lymphadnectomy.     Surgeon: Donaciano Eva    Operative Findings:  : 20cm cystic mass, smooth. Attachments between inferior aspect of cystic mass and posterior pelvic peritoneum. Normal appearing omentum and nodes. Invasive malignancy on evaluation. No gross extra-ovarian disease. Clinical stage I disease. No gross residual disease.    12/11/2020 Cancer Staging   Staging form: Ovary, Fallopian Tube, and Primary Peritoneal Carcinoma, AJCC 8th Edition - Pathologic stage from 12/11/2020: FIGO Stage IA (pT1a, pN0, cM0) - Signed by [name], MD on 12/11/2020 Stage prefix: Initial diagnosis    01/10/2021 Genetic Testing   Negative germline and somatic genetic testing: no pathogenic variants detected in [employer] MyRisk Panel.   No somatic variants detected in [employer] MyChoice HRD testing.  Genomic instaiblity score is negative.  The report dates are January 10, 2021.    The [facility] gene panel offered by [employer] includes sequencing and deletion/duplication testing of the following 48 genes: APC, ATM, AXIN2, BAP1, BARD1, BMPR1A, BRCA1, BRCA2, BRIP1, CHD1, CDK4, CDKN2A(p16 and p14ARF), CHEK2, CTNNA1, EGFR, EPCAM, FH, FLCN, GREM1, HOXB13, MEN1, MET, MITF , MLH1, MSH2, MSH3, MSH6, MUTYH, NTHL1, PALB2, PMS2, POLD1, POLE, PTEN, RAD51C, RAD51D, RET, SDHA, SDHB, SDHC, SDHD, SMAD4, STK11,TERT, TP53, TSC1, TSC2, and VHL.    01/15/2021 -  Chemotherapy   Patient is on Treatment Plan : OVARIAN Carboplatin (AUC 6) / Paclitaxel (175) q21d x 6 cycles       PHYSICAL EXAMINATION: ECOG PERFORMANCE STATUS: 1 - Symptomatic but completely ambulatory  Vitals:   03/17/21 0854  BP: (!) 148/69  Pulse: 85  Resp: 18  Temp: (!) 97.5 F (36.4 C)  SpO2: 99%   Filed Weights   03/17/21 0854  Weight: 167 lb 9.6 oz (76 kg)    GENERAL:alert, no distress and comfortable SKIN: Her  skin cellulitis is improved NEURO: alert & oriented x 3 with fluent speech, no focal motor/sensory deficits  LABORATORY DATA:  I have reviewed the data as listed    Component Value Date/Time   NA 142 03/17/2021 0838   K 3.9 03/17/2021 0838   CL 110 03/17/2021 0838   CO2 21 (L) 03/17/2021 0838   GLUCOSE 141 (H) 03/17/2021 0838   BUN 17 03/17/2021 0838   CREATININE 0.83 03/17/2021 0838   CALCIUM 9.7 03/17/2021 0838   PROT 8.1 03/17/2021 0838   ALBUMIN 4.2 03/17/2021 0838   AST 16 03/17/2021 0838   ALT 25 03/17/2021 0838   ALKPHOS 95 03/17/2021 0838   BILITOT 0.2 (L) 03/17/2021 0838   GFRNONAA >60 03/17/2021 0838    No results found for: SPEP, UPEP  Lab Results  Component Value Date   WBC 9.3 03/17/2021   NEUTROABS 7.8 (H)  03/17/2021   HGB 13.7 03/17/2021   HCT 39.9 03/17/2021   MCV 90.3 03/17/2021   PLT 250 03/17/2021      Chemistry      Component Value Date/Time   NA 142 03/17/2021 0838   K 3.9 03/17/2021 0838   CL 110 03/17/2021 0838   CO2 21 (L) 03/17/2021 0838   BUN 17 03/17/2021 0838   CREATININE 0.83 03/17/2021 0838      Component Value Date/Time   CALCIUM 9.7 03/17/2021 0838   ALKPHOS 95 03/17/2021 0838   AST 16 03/17/2021 0838   ALT 25 03/17/2021 0838   BILITOT 0.2 (L) 03/17/2021 2449

## 2021-03-17 NOTE — Telephone Encounter (Signed)
-----   Message from [name], MD sent at 03/17/2021  2:14 PM EST ----- Vitamin D level is good I think she can take 5000 units daily

## 2021-03-17 NOTE — Assessment & Plan Note (Signed)
She denies significant side effects from recent treatment except for poor venous access and superficial thrombophlebitis Her recent cellulitis is improving on antibiotics We will continue treatment as scheduled If she continues to have difficulties with venous access, we will discontinue future treatment

## 2021-03-17 NOTE — Assessment & Plan Note (Signed)
This is resolving She will complete the course of antibiotics

## 2021-03-17 NOTE — Assessment & Plan Note (Signed)
Test results are pending We discussed nutritional supplement

## 2021-03-17 NOTE — Patient Instructions (Signed)
[place] ONCOLOGY   Discharge Instructions: Thank you for choosing [facility] to provide your oncology and hematology care.   If you have a lab appointment with the [facility] [place], please go directly to the [facility] and check in at the registration area.   Wear comfortable clothing and clothing appropriate for easy access to any Portacath or PICC line.   We strive to give you quality time with your provider. You may need to reschedule your appointment if you arrive late (15 or more minutes).  Arriving late affects you and other patients whose appointments are after yours.  Also, if you miss three or more appointments without notifying the office, you may be dismissed from the clinic at the provider's discretion.      For prescription refill requests, have your pharmacy contact our office and allow 72 hours for refills to be completed.    Today you received the following chemotherapy and/or immunotherapy agents: Paclitaxel (Taxol) and Carboplatin       To help prevent nausea and vomiting after your treatment, we encourage you to take your nausea medication as directed.  BELOW ARE SYMPTOMS THAT SHOULD BE REPORTED IMMEDIATELY: *FEVER GREATER THAN 100.4 F (38 C) OR HIGHER *CHILLS OR SWEATING *NAUSEA AND VOMITING THAT IS NOT CONTROLLED WITH YOUR NAUSEA MEDICATION *UNUSUAL SHORTNESS OF BREATH *UNUSUAL BRUISING OR BLEEDING *URINARY PROBLEMS (pain or burning when urinating, or frequent urination) *BOWEL PROBLEMS (unusual diarrhea, constipation, pain near the anus) TENDERNESS IN MOUTH AND THROAT WITH OR WITHOUT PRESENCE OF ULCERS (sore throat, sores in mouth, or a toothache) UNUSUAL RASH, SWELLING OR PAIN  UNUSUAL VAGINAL DISCHARGE OR ITCHING   Items with * indicate a potential emergency and should be followed up as soon as possible or go to the Emergency Department if any problems should occur.  Please show the CHEMOTHERAPY ALERT CARD or  IMMUNOTHERAPY ALERT CARD at check-in to the Emergency Department and triage nurse.  Should you have questions after your visit or need to cancel or reschedule your appointment, please contact [facility]  Dept: [phone]  and follow the prompts.  Office hours are 8:00 a.m. to 4:30 p.m. Monday - Friday. Please note that voicemails left after 4:00 p.m. may not be returned until the following business day.  We are closed weekends and major holidays. You have access to a nurse at all times for urgent questions. Please call the main number to the clinic Dept: [phone] and follow the prompts.   For any non-urgent questions, you may also contact your provider using MyChart. We now offer e-Visits for anyone 75 and older to request care online for non-urgent symptoms. For details visit [email or web address].   Also download the MyChart app! Go to the app store, search "MyChart", open the app, select [facility], and log in with your MyChart username and password.  Due to Covid, a mask is required upon entering the hospital/clinic. If you do not have a mask, one will be given to you upon arrival. For doctor visits, patients may have 1 support person aged 1 or older with them. For treatment visits, patients cannot have anyone with them due to current Covid guidelines and our immunocompromised population.    Influenza (Flu) Vaccine (Inactivated or Recombinant): What You Need to Know 1. Why get vaccinated? Influenza vaccine can prevent influenza (flu). Flu is a contagious disease that spreads around the [place] every year, usually between October and May. Anyone can get the  flu, but it is more dangerous for some people. Infants and young children, people 10 years and older, pregnant people, and people with certain health conditions or a weakened immune system are at greatest risk of flu complications. Pneumonia, bronchitis, sinus infections, and ear infections are  examples of flu-related complications. If you have a medical condition, such as heart disease, cancer, or diabetes, flu can make it worse. Flu can cause fever and chills, sore throat, muscle aches, fatigue, cough, headache, and runny or stuffy nose. Some people may have vomiting and diarrhea, though this is more common in children than adults. In an average year, thousands of people in the [place] die from flu, and many more are hospitalized. Flu vaccine prevents millions of illnesses and flu-related visits to the doctor each year. 2. Influenza vaccines CDC recommends everyone 6 months and older get vaccinated every flu season. Children 6 months through 57 years of age may need 2 doses during a single flu season. Everyone else needs only 1 dose each flu season. It takes about 2 weeks for protection to develop after vaccination. There are many flu viruses, and they are always changing. Each year a new flu vaccine is made to protect against the influenza viruses believed to be likely to cause disease in the upcoming flu season. Even when the vaccine doesn't exactly match these viruses, it may still provide some protection. Influenza vaccine does not cause flu. Influenza vaccine may be given at the same time as other vaccines. 3. Talk with your health care provider Tell your vaccination provider if the person getting the vaccine: Has had an allergic reaction after a previous dose of influenza vaccine, or has any severe, life-threatening allergies Has ever had Guillain-Barr Syndrome (also called "GBS") In some cases, your health care provider may decide to postpone influenza vaccination until a future visit. Influenza vaccine can be administered at any time during pregnancy. People who are or will be pregnant during influenza season should receive inactivated influenza vaccine. People with minor illnesses, such as a cold, may be vaccinated. People who are moderately or severely ill should usually  wait until they recover before getting influenza vaccine. Your health care provider can give you more information. 4. Risks of a vaccine reaction Soreness, redness, and swelling where the shot is given, fever, muscle aches, and headache can happen after influenza vaccination. There may be a very small increased risk of Guillain-Barr Syndrome (GBS) after inactivated influenza vaccine (the flu shot). Young children who get the flu shot along with pneumococcal vaccine (PCV13) and/or DTaP vaccine at the same time might be slightly more likely to have a seizure caused by fever. Tell your health care provider if a child who is getting flu vaccine has ever had a seizure. People sometimes faint after medical procedures, including vaccination. Tell your provider if you feel dizzy or have vision changes or ringing in the ears. As with any medicine, there is a very remote chance of a vaccine causing a severe allergic reaction, other serious injury, or death. 5. What if there is a serious problem? An allergic reaction could occur after the vaccinated person leaves the clinic. If you see signs of a severe allergic reaction (hives, swelling of the face and throat, difficulty breathing, a fast heartbeat, dizziness, or weakness), call 9-1-1 and get the person to the nearest hospital. For other signs that concern you, call your health care provider. Adverse reactions should be reported to the Vaccine Adverse Event Reporting System (VAERS). Your health  care provider will usually file this report, or you can do it yourself. Visit the VAERS website at www.vaers.SamedayNews.es or call 361-639-4903. VAERS is only for reporting reactions, and VAERS staff members do not give medical advice. 6. The National Vaccine Injury Compensation Program The [employer] Vaccine Injury Compensation Program (VICP) is a federal program that was created to compensate people who may have been injured by certain vaccines. Claims regarding alleged  injury or death due to vaccination have a time limit for filing, which may be as short as two years. Visit the VICP website at GoldCloset.com.ee or call 720-855-7924 to learn about the program and about filing a claim. 7. How can I learn more? Ask your health care provider. Call your local or state health department. Visit the website of the Food and Drug Administration (FDA) for vaccine package inserts and additional information at TraderRating.uy. Contact the Centers for Disease Control and Prevention (CDC): Call (478)043-4691 (1-800-CDC-INFO) or Visit CDC's website at https://gibson.com/. Vaccine Information Statement Inactivated Influenza Vaccine (12/15/2019) This information is not intended to replace advice given to you by your health care provider. Make sure you discuss any questions you have with your health care provider. Document Revised: 02/01/2020 Document Reviewed: 02/01/2020 Elsevier Patient Education  2022 [employer].

## 2021-03-17 NOTE — Telephone Encounter (Signed)
Called and left below message. Ask her to call the office back for questions. 

## 2021-03-19 ENCOUNTER — Telehealth: Payer: Self-pay | Admitting: *Deleted

## 2021-03-19 NOTE — Telephone Encounter (Signed)
Spoke with the patient and rescheduled her appt from Dec 9th to Jan 9th

## 2021-03-28 ENCOUNTER — Encounter: Payer: Self-pay | Admitting: Hematology and Oncology

## 2021-04-04 ENCOUNTER — Telehealth: Payer: Self-pay

## 2021-04-04 ENCOUNTER — Encounter: Payer: Self-pay | Admitting: Hematology and Oncology

## 2021-04-04 ENCOUNTER — Inpatient Hospital Stay (HOSPITAL_BASED_OUTPATIENT_CLINIC_OR_DEPARTMENT_OTHER): Payer: BC Managed Care – PPO | Admitting: Hematology and Oncology

## 2021-04-04 ENCOUNTER — Other Ambulatory Visit: Payer: Self-pay

## 2021-04-04 ENCOUNTER — Other Ambulatory Visit: Payer: Self-pay | Admitting: Hematology and Oncology

## 2021-04-04 VITALS — BP 155/68 | HR 72 | Temp 97.1°F | Resp 16 | Ht 62.0 in | Wt 171.0 lb

## 2021-04-04 DIAGNOSIS — Z5111 Encounter for antineoplastic chemotherapy: Secondary | ICD-10-CM | POA: Diagnosis not present

## 2021-04-04 DIAGNOSIS — Z90722 Acquired absence of ovaries, bilateral: Secondary | ICD-10-CM | POA: Diagnosis not present

## 2021-04-04 DIAGNOSIS — E559 Vitamin D deficiency, unspecified: Secondary | ICD-10-CM | POA: Diagnosis not present

## 2021-04-04 DIAGNOSIS — C561 Malignant neoplasm of right ovary: Secondary | ICD-10-CM | POA: Diagnosis not present

## 2021-04-04 DIAGNOSIS — L03818 Cellulitis of other sites: Secondary | ICD-10-CM | POA: Diagnosis not present

## 2021-04-04 DIAGNOSIS — Z90710 Acquired absence of both cervix and uterus: Secondary | ICD-10-CM | POA: Diagnosis not present

## 2021-04-04 DIAGNOSIS — I700 Atherosclerosis of aorta: Secondary | ICD-10-CM | POA: Diagnosis not present

## 2021-04-04 DIAGNOSIS — Z79899 Other long term (current) drug therapy: Secondary | ICD-10-CM | POA: Diagnosis not present

## 2021-04-04 MED FILL — Fosaprepitant Dimeglumine For IV Infusion 150 MG (Base Eq): INTRAVENOUS | Qty: 5 | Status: AC

## 2021-04-04 MED FILL — Dexamethasone Sodium Phosphate Inj 100 MG/10ML: INTRAMUSCULAR | Qty: 1 | Status: AC

## 2021-04-04 NOTE — Progress Notes (Signed)
[name] OFFICE PROGRESS NOTE  Patient Care Team: [name], MD as PCP - General (Family Medicine)  ASSESSMENT & PLAN:  Ovarian cancer, right [facility]) I had a very long discussion with the patient and her husband regarding the risk and benefits of discontinuing treatment I gave her a copy of her surgical pathology and reviewed with her the pathologic staging, the adequacy of lymph node sampling, subtype of tumor and how to impact on decision making Overall, the patient has received 4 cycles of adjuvant chemotherapy which in my opinion, is adequate to discontinue moving forward rather than placement of port or PICC line for 2 more cycles of treatment We discussed the limitation of ordering tumor marker CA125 We discussed the risk and benefits of pursuing CT imaging next month for objective assessment of response to therapy We discussed the role of weight loss and healthy lifestyle changes to reduce risk of cancer recurrence Ultimately, after long discussion, she is in agreement to discontinue chemotherapy and her appointment next week and in December have been canceled. I will schedule blood work and imaging study to be done in 2 weeks and I will see her back for further evaluation and follow-up  Orders Placed This Encounter  Procedures   CT ABDOMEN PELVIS W CONTRAST    Standing Status:   Future    Standing Expiration Date:   04/04/2022    Order Specific Question:   If indicated for the ordered procedure, I authorize the administration of contrast media per Radiology protocol    Answer:   Yes    Order Specific Question:   Preferred imaging location?    Answer:   [facility]    Order Specific Question:   Radiology Contrast Protocol - do NOT remove file path    Answer:   [email or web address]    Order Specific Question:   Is patient pregnant?    Answer:   No   CA 125    Standing Status:   Standing    Number of  Occurrences:   11    Standing Expiration Date:   04/04/2022    All questions were answered. The patient knows to call the clinic with any problems, questions or concerns. The total time spent in the appointment was 30 minutes encounter with patients including review of chart and various tests results, discussions about plan of care and coordination of care plan   [name], MD 04/04/2021 4:07 PM  INTERVAL HISTORY: Please see below for problem oriented charting. she returns for treatment follow-up with her husband regarding the role of continuing chemotherapy versus stopping She had difficulties with venous access and was told by infusion nurses that she might need placement or port or PICC line for future treatment She tolerated treatment very well otherwise She has significant concern about the role of further treatment, role of CA125/tumor marker monitoring, and what to expect in the future  REVIEW OF SYSTEMS:   Constitutional: Denies fevers, chills or abnormal weight loss Eyes: Denies blurriness of vision Ears, nose, mouth, throat, and face: Denies mucositis or sore throat Respiratory: Denies cough, dyspnea or wheezes Cardiovascular: Denies palpitation, chest discomfort or lower extremity swelling Gastrointestinal:  Denies nausea, heartburn or change in bowel habits Skin: Denies abnormal skin rashes Lymphatics: Denies new lymphadenopathy Neurological:Denies numbness, tingling or new weaknesses Behavioral/Psych: Mood is stable, no new changes  All other systems were reviewed with the patient and are negative.  I have reviewed the past medical history, past surgical  history, social history and family history with the patient and they are unchanged from previous note.  ALLERGIES:  is allergic to codeine.  MEDICATIONS:  Current Outpatient Medications  Medication Sig Dispense Refill   Calcium Carb-Cholecalciferol (CALCIUM 600+D3 PO) Take 2 tablets by mouth in the morning. (Patient  not taking: Reported on 01/14/2021)     levothyroxine (SYNTHROID) 50 MCG tablet TAKE 1 TABLET BY MOUTH EVERY DAY 90 tablet 3   lidocaine-prilocaine (EMLA) cream Apply 1 application topically as needed. 30 g 0   ondansetron (ZOFRAN) 8 MG tablet Take 1 tablet (8 mg total) by mouth every 8 (eight) hours as needed for refractory nausea / vomiting. 30 tablet 1   prochlorperazine (COMPAZINE) 10 MG tablet Take 1 tablet (10 mg total) by mouth every 6 (six) hours as needed (Nausea or vomiting). 30 tablet 1   vitamin B-12 (CYANOCOBALAMIN) 1000 MCG tablet Take 1,000 mcg by mouth in the morning.     vitamin C (ASCORBIC ACID) 500 MG tablet Take 500 mg by mouth in the morning.     Vitamin D, Ergocalciferol, (DRISDOL) 1.25 MG (50000 UNIT) CAPS capsule Take 1 capsule (50,000 Units total) by mouth every 7 (seven) days. 12 capsule 0   No current facility-administered medications for this visit.    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 There is a large, cystic mass of the right ovary with peripheral nodular soft tissue components measuring at least 19.8 x 13.3 x 19.8 cm. Findings are consistent with primary ovarian malignancy. 2. Minimal stranding of the omentum in the ventral abdomen and minimal thickening of the peritoneum in the right lower quadrant in the right paracolic gutter without evidence of discrete omental or peritoneal nodularity. Findings are modestly suspicious for early peritoneal metastatic disease. 3. No lymphadenopathy or other evidence of metastatic disease in the abdomen or pelvis. 4. Polyp or adherent gallstone near the gallbladder fundus, better assessed by prior ultrasound. 5. Large burden of stool throughout the colon and rectum.   Aortic Atherosclerosis (ICD10-I70.0).    12/03/2020 Pathology Results   SURGICAL PATHOLOGY  CASE: [id number]  PATIENT: Brooke Khan  Surgical Pathology Report   Clinical History: Ovarian mass (crm)    FINAL MICROSCOPIC DIAGNOSIS:   A. UTERUS, CERVIX, BILATERAL FALLOPIAN TUBES AND OVARIES:  Ovary, right  -  Endometrioid carcinoma, FIGO grade 3  -  See oncology table and comment below   Ovary, left:  -  No carcinoma identified   Bilateral Fallopian tubes:  -  No carcinoma identified   Uterus:  -  No carcinoma identified   Cervix:  -  No carcinoma identified   B. LYMPH NODES, RIGHT PARAAORTIC, RESECTION:  -  No carcinoma identified in six lymph nodes (0/6)   C. LYMPH NODES, LEFT PARAAORTIC, RESECTION:  -  No carcinoma identified in five lymph nodes (0/5)   D. LYMPH NODES, RIGHT PELVIC, RESECTION:  -  No carcinoma identified in five lymph nodes (0/5)   E. LYMPH NODES, LEFT PELVIC, RESECTION:  -  No carcinoma identified in three lymph nodes (0/3)   F. OMENTUM, OMENTECTOMY:  -  No carcinoma identified   G. PERITONEUM, ANTERIOR PELVIC, BIOPSY:  -  No carcinoma identified   H. PERITONEUM, RIGHT PELVIC, BIOPSY:  -  No carcinoma  identified   I. PERITONEUM, LEFT PELVIC, BIOPSY:  -  No carcinoma identified   J. PERITONEUM, POSTERIOR PELVIC, BIOPSY:  -  No carcinoma identified   K. PERITONEUM, RIGHT ABDOMINAL, BIOPSY:  -  No carcinoma identified   L. PERITONEUM, LEFT ABDOMINAL, BIOPSY:  -  No carcinoma identified    ONCOLOGY TABLE:   OVARY or FALLOPIAN TUBE or PRIMARY PERITONEUM: Resection   Procedure: Hysterectomy with bilateral salpingo-oophorectomy  Specimen Integrity: Intact  Tumor Site: Ovary, right  Tumor Size: 1.5 cm  Histologic Type: Endometrioid carcinoma  Histologic Grade: FIGO grade 3  Ovarian Surface Involvement: Not identified  Fallopian Tube Surface Involvement: Not identified  Other Tissue/ Organ Involvement: Not applicable  Largest Extrapelvic Peritoneal Focus: Not  applicable  Peritoneal/Ascitic Fluid Involvement: Malignant cells not identified  Chemotherapy Response Score (CRS): Not applicable, no known presurgical therapy  Regional Lymph Nodes:       Number of Nodes with Metastasis Greater than 10 mm: 0       Number of Nodes with Metastasis 10 mm or Less (excludes isolated  tumor cells): 0       Number of Nodes with Isolated Tumor Cells (0.2 mm or less): 0       Number of Lymph Nodes Examined: 19  Distant Metastasis:       Distant Site(s) Involved: Not applicable  Pathologic Stage Classification (pTNM, AJCC 8th Edition): pT1a, pN0  Ancillary Studies: Can be performed upon request  Representative Tumor Block: A8  Comment(s): Dr. Vic Ripper reviewed the case and agrees with the above diagnosis.     12/03/2020 Surgery   Surgeon: Donaciano Eva    Operation: Robotic-assisted laparoscopic total hysterectomy with bilateral salpingoophorectomy, omentectomy, pelvic and para-aortic lymphadnectomy.     Surgeon: Donaciano Eva    Operative Findings:  : 20cm cystic mass, smooth. Attachments between inferior aspect of cystic mass and posterior pelvic peritoneum. Normal appearing omentum and nodes. Invasive malignancy on evaluation. No gross extra-ovarian disease. Clinical stage I disease. No gross residual disease.    12/11/2020 Cancer Staging   Staging form: Ovary, Fallopian Tube, and Primary Peritoneal Carcinoma, AJCC 8th Edition - Pathologic stage from 12/11/2020: FIGO Stage IA (pT1a, pN0, cM0) - Signed by [name], MD on 12/11/2020 Stage prefix: Initial diagnosis    01/10/2021 Genetic Testing   Negative germline and somatic genetic testing: no pathogenic variants detected in [employer] MyRisk Panel.   No somatic variants detected in [employer] [facility] HRD testing.  Genomic instaiblity score is negative.  The report dates are January 10, 2021.    [facility] gene panel offered by [employer] includes sequencing and  deletion/duplication testing of the following 48 genes: APC, ATM, AXIN2, BAP1, BARD1, BMPR1A, BRCA1, BRCA2, BRIP1, CHD1, CDK4, CDKN2A(p16 and p14ARF), CHEK2, CTNNA1, EGFR, EPCAM, FH, FLCN, GREM1, HOXB13, MEN1, MET, MITF , MLH1, MSH2, MSH3, MSH6, MUTYH, NTHL1, PALB2, PMS2, POLD1, POLE, PTEN, RAD51C, RAD51D, RET, SDHA, SDHB, SDHC, SDHD, SMAD4, STK11,TERT, TP53, TSC1, TSC2, and VHL.    01/15/2021 -  Chemotherapy   Patient is on Treatment Plan : OVARIAN Carboplatin (AUC 6) / Paclitaxel (175) q21d x 6 cycles       PHYSICAL EXAMINATION: ECOG PERFORMANCE STATUS: 1 - Symptomatic but completely ambulatory  Vitals:   04/04/21 0845  BP: (!) 155/68  Pulse: 72  Resp: 16  Temp: (!) 97.1 F (36.2 C)  SpO2: 100%   Filed Weights   04/04/21 0845  Weight: 171 lb (77.6 kg)    GENERAL:alert, no distress  and comfortable SKIN: Noted minor skin bruises from prior phlebitis and IV access  NEURO: alert & oriented x 3 with fluent speech, no focal motor/sensory deficits  LABORATORY DATA:  I have reviewed the data as listed    Component Value Date/Time   NA 142 03/17/2021 0838   K 3.9 03/17/2021 0838   CL 110 03/17/2021 0838   CO2 21 (L) 03/17/2021 0838   GLUCOSE 141 (H) 03/17/2021 0838   BUN 17 03/17/2021 0838   CREATININE 0.83 03/17/2021 0838   CALCIUM 9.7 03/17/2021 0838   PROT 8.1 03/17/2021 0838   ALBUMIN 4.2 03/17/2021 0838   AST 16 03/17/2021 0838   ALT 25 03/17/2021 0838   ALKPHOS 95 03/17/2021 0838   BILITOT 0.2 (L) 03/17/2021 0838   GFRNONAA >60 03/17/2021 0838    No results found for: SPEP, UPEP  Lab Results  Component Value Date   WBC 9.3 03/17/2021   NEUTROABS 7.8 (H) 03/17/2021   HGB 13.7 03/17/2021   HCT 39.9 03/17/2021   MCV 90.3 03/17/2021   PLT 250 03/17/2021      Chemistry      Component Value Date/Time   NA 142 03/17/2021 0838   K 3.9 03/17/2021 0838   CL 110 03/17/2021 0838   CO2 21 (L) 03/17/2021 0838   BUN 17 03/17/2021 0838   CREATININE 0.83 03/17/2021  0838      Component Value Date/Time   CALCIUM 9.7 03/17/2021 0838   ALKPHOS 95 03/17/2021 0838   AST 16 03/17/2021 0838   ALT 25 03/17/2021 0838   BILITOT 0.2 (L) 03/17/2021 6599

## 2021-04-04 NOTE — Assessment & Plan Note (Signed)
I had a very long discussion with the patient and her husband regarding the risk and benefits of discontinuing treatment I gave her a copy of her surgical pathology and reviewed with her the pathologic staging, the adequacy of lymph node sampling, subtype of tumor and how to impact on decision making Overall, the patient has received 4 cycles of adjuvant chemotherapy which in my opinion, is adequate to discontinue moving forward rather than placement of port or PICC line for 2 more cycles of treatment We discussed the limitation of ordering tumor marker CA125 We discussed the risk and benefits of pursuing CT imaging next month for objective assessment of response to therapy We discussed the role of weight loss and healthy lifestyle changes to reduce risk of cancer recurrence Ultimately, after long discussion, she is in agreement to discontinue chemotherapy and her appointment next week and in December have been canceled. I will schedule blood work and imaging study to be done in 2 weeks and I will see her back for further evaluation and follow-up

## 2021-04-04 NOTE — Telephone Encounter (Signed)
Followed up with patient regarding her decision on whether to continue treatment. At this time, the patient states that she would like to stop treatment and not continue with any additional cycles.  Dr. Alvy Bimler made aware.

## 2021-04-07 ENCOUNTER — Other Ambulatory Visit: Payer: Self-pay | Admitting: Hematology and Oncology

## 2021-04-07 ENCOUNTER — Inpatient Hospital Stay: Payer: BC Managed Care – PPO

## 2021-04-07 ENCOUNTER — Telehealth: Payer: Self-pay | Admitting: Oncology

## 2021-04-07 ENCOUNTER — Inpatient Hospital Stay: Payer: BC Managed Care – PPO | Admitting: Hematology and Oncology

## 2021-04-07 DIAGNOSIS — F419 Anxiety disorder, unspecified: Secondary | ICD-10-CM | POA: Insufficient documentation

## 2021-04-07 MED ORDER — LORAZEPAM 0.5 MG PO TABS: ORAL_TABLET | ORAL | 0 refills | Status: DC

## 2021-04-07 NOTE — Telephone Encounter (Signed)
Called Brooke Khan and advised her that she will need to schedule a CT scan on 04/15/21 and provided her with central scheduling's number. Also discussed that the schedulers will call her with an appointment to see Dr. Alvy Bimler on 05/07/21 to review the results.  She verbalized agreement and said she needed to take an Ativan 0.5 mg tablet for anxiety before her last CT scan.  She is wondering if she can have a refill sent to the [facility] on [address].

## 2021-04-07 NOTE — Telephone Encounter (Signed)
Done, instruction on the bottle, advise her she cannot drive that day due to risks of sedation

## 2021-04-07 NOTE — Telephone Encounter (Signed)
Left a message advising that the refill for Ativan has been sent to [facility] and instructions per Dr. Alvy Bimler.

## 2021-04-14 ENCOUNTER — Other Ambulatory Visit: Payer: Self-pay

## 2021-04-14 ENCOUNTER — Ambulatory Visit (HOSPITAL_COMMUNITY)
Admission: RE | Admit: 2021-04-14 | Discharge: 2021-04-14 | Disposition: A | Discharge status: Home / Self Care | Payer: BC Managed Care – PPO | Source: Ambulatory Visit | Attending: Hematology and Oncology | Admitting: Hematology and Oncology

## 2021-04-14 ENCOUNTER — Inpatient Hospital Stay: Payer: BC Managed Care – PPO | Attending: Gynecologic Oncology

## 2021-04-14 ENCOUNTER — Other Ambulatory Visit: Payer: BC Managed Care – PPO

## 2021-04-14 DIAGNOSIS — Z79899 Other long term (current) drug therapy: Secondary | ICD-10-CM | POA: Insufficient documentation

## 2021-04-14 DIAGNOSIS — C561 Malignant neoplasm of right ovary: Secondary | ICD-10-CM | POA: Insufficient documentation

## 2021-04-14 DIAGNOSIS — K8020 Calculus of gallbladder without cholecystitis without obstruction: Secondary | ICD-10-CM | POA: Diagnosis not present

## 2021-04-14 DIAGNOSIS — N200 Calculus of kidney: Secondary | ICD-10-CM | POA: Insufficient documentation

## 2021-04-14 DIAGNOSIS — C541 Malignant neoplasm of endometrium: Secondary | ICD-10-CM | POA: Insufficient documentation

## 2021-04-14 DIAGNOSIS — C569 Malignant neoplasm of unspecified ovary: Secondary | ICD-10-CM | POA: Diagnosis not present

## 2021-04-14 DIAGNOSIS — I700 Atherosclerosis of aorta: Secondary | ICD-10-CM | POA: Insufficient documentation

## 2021-04-14 DIAGNOSIS — R918 Other nonspecific abnormal finding of lung field: Secondary | ICD-10-CM | POA: Insufficient documentation

## 2021-04-14 DIAGNOSIS — K760 Fatty (change of) liver, not elsewhere classified: Secondary | ICD-10-CM | POA: Insufficient documentation

## 2021-04-14 DIAGNOSIS — R590 Localized enlarged lymph nodes: Secondary | ICD-10-CM | POA: Insufficient documentation

## 2021-04-14 DIAGNOSIS — R030 Elevated blood-pressure reading, without diagnosis of hypertension: Secondary | ICD-10-CM | POA: Insufficient documentation

## 2021-04-14 LAB — CBC WITH DIFFERENTIAL (CANCER CENTER ONLY)
Abs Immature Granulocytes: 0.01 10*3/uL (ref 0.00–0.07)
Basophils Absolute: 0 10*3/uL (ref 0.0–0.1)
Basophils Relative: 0 %
Eosinophils Absolute: 0.1 10*3/uL (ref 0.0–0.5)
Eosinophils Relative: 1 %
HCT: 37.5 % (ref 36.0–46.0)
Hemoglobin: 12.4 g/dL (ref 12.0–15.0)
Immature Granulocytes: 0 %
Lymphocytes Relative: 42 %
Lymphs Abs: 1.9 10*3/uL (ref 0.7–4.0)
MCH: 31.6 pg (ref 26.0–34.0)
MCHC: 33.1 g/dL (ref 30.0–36.0)
MCV: 95.4 fL (ref 80.0–100.0)
Monocytes Absolute: 0.4 10*3/uL (ref 0.1–1.0)
Monocytes Relative: 9 %
Neutro Abs: 2.1 10*3/uL (ref 1.7–7.7)
Neutrophils Relative %: 48 %
Platelet Count: 221 10*3/uL (ref 150–400)
RBC: 3.93 MIL/uL (ref 3.87–5.11)
RDW: 15.8 % — ABNORMAL HIGH (ref 11.5–15.5)
WBC Count: 4.5 10*3/uL (ref 4.0–10.5)
nRBC: 0 % (ref 0.0–0.2)

## 2021-04-14 LAB — CMP (CANCER CENTER ONLY)
ALT: 26 U/L (ref 0–44)
AST: 19 U/L (ref 15–41)
Albumin: 4.3 g/dL (ref 3.5–5.0)
Alkaline Phosphatase: 79 U/L (ref 38–126)
Anion gap: 12 (ref 5–15)
BUN: 17 mg/dL (ref 6–20)
CO2: 27 mmol/L (ref 22–32)
Calcium: 9.6 mg/dL (ref 8.9–10.3)
Chloride: 106 mmol/L (ref 98–111)
Creatinine: 0.81 mg/dL (ref 0.44–1.00)
GFR, Estimated: >60 mL/min (ref >60)
Glucose, Bld: 96 mg/dL (ref 70–99)
Potassium: 3.9 mmol/L (ref 3.5–5.1)
Sodium: 145 mmol/L (ref 135–145)
Total Bilirubin: 0.2 mg/dL — ABNORMAL LOW (ref 0.3–1.2)
Total Protein: 7.6 g/dL (ref 6.5–8.1)

## 2021-04-14 MED ORDER — IOHEXOL 350 MG/ML SOLN
80.0000 mL | Freq: Once | INTRAVENOUS | Status: AC | PRN
  Administered 2021-04-14: 80 mL via INTRAVENOUS

## 2021-04-15 ENCOUNTER — Other Ambulatory Visit: Payer: BC Managed Care – PPO

## 2021-04-15 LAB — CA 125: Cancer Antigen (CA) 125: 7.7 U/mL (ref 0.0–38.1)

## 2021-04-16 ENCOUNTER — Encounter: Payer: Self-pay | Admitting: Oncology

## 2021-04-16 NOTE — Progress Notes (Signed)
Requested ER/PR with [facility] via email on accession [phone].

## 2021-04-17 ENCOUNTER — Inpatient Hospital Stay (HOSPITAL_BASED_OUTPATIENT_CLINIC_OR_DEPARTMENT_OTHER): Payer: BC Managed Care – PPO | Admitting: Hematology and Oncology

## 2021-04-17 ENCOUNTER — Other Ambulatory Visit: Payer: Self-pay

## 2021-04-17 DIAGNOSIS — R918 Other nonspecific abnormal finding of lung field: Secondary | ICD-10-CM | POA: Diagnosis not present

## 2021-04-17 DIAGNOSIS — N200 Calculus of kidney: Secondary | ICD-10-CM | POA: Diagnosis not present

## 2021-04-17 DIAGNOSIS — R030 Elevated blood-pressure reading, without diagnosis of hypertension: Secondary | ICD-10-CM | POA: Diagnosis not present

## 2021-04-17 DIAGNOSIS — C561 Malignant neoplasm of right ovary: Secondary | ICD-10-CM | POA: Diagnosis not present

## 2021-04-17 DIAGNOSIS — C541 Malignant neoplasm of endometrium: Secondary | ICD-10-CM | POA: Diagnosis not present

## 2021-04-17 DIAGNOSIS — K760 Fatty (change of) liver, not elsewhere classified: Secondary | ICD-10-CM | POA: Diagnosis not present

## 2021-04-17 DIAGNOSIS — Z79899 Other long term (current) drug therapy: Secondary | ICD-10-CM | POA: Diagnosis not present

## 2021-04-17 DIAGNOSIS — R590 Localized enlarged lymph nodes: Secondary | ICD-10-CM | POA: Diagnosis not present

## 2021-04-17 DIAGNOSIS — I700 Atherosclerosis of aorta: Secondary | ICD-10-CM | POA: Diagnosis not present

## 2021-04-17 DIAGNOSIS — K8020 Calculus of gallbladder without cholecystitis without obstruction: Secondary | ICD-10-CM | POA: Diagnosis not present

## 2021-04-18 ENCOUNTER — Encounter: Payer: Self-pay | Admitting: Hematology and Oncology

## 2021-04-18 ENCOUNTER — Ambulatory Visit: Payer: BC Managed Care – PPO | Admitting: Gynecologic Oncology

## 2021-04-18 LAB — SURGICAL PATHOLOGY

## 2021-04-18 NOTE — Assessment & Plan Note (Signed)
I have reviewed multiple imaging studies with the patient and GYN surgeon It is hard to believe the patient have recurrent disease for early stage Ia cancer I am wondering whether we could be missing a different diagnosis from her pathology I will call pathologist next week In the meantime, I will also order ER/PR testing on the tissue sample to see if we can consider further antiestrogen therapy What I suspect is happening is she likely have abnormal lymphadenopathy after surgery and before chemotherapy or the abnormal lymphadenopathy seen could be due to postoperative changes The peritoneal lesion seen may or may not be related to disease Her tumor marker is within normal range I will call her back within the week after discussion with pathologist Currently, she is not symptomatic My preference would be to repeat imaging study again in 6 to 8 weeks for further follow-up

## 2021-04-18 NOTE — Progress Notes (Signed)
[facility] OFFICE PROGRESS NOTE  Patient Care Team: Hunter, Stephen O, MD as PCP - General (Family Medicine)  ASSESSMENT & PLAN:  Ovarian cancer, right (HCC) I have reviewed multiple imaging studies with the patient and GYN surgeon It is hard to believe the patient have recurrent disease for early stage Ia cancer I am wondering whether we could be missing a different diagnosis from her pathology I will call pathologist next week In the meantime, I will also order ER/PR testing on the tissue sample to see if we can consider further antiestrogen therapy What I suspect is happening is she likely have abnormal lymphadenopathy after surgery and before chemotherapy or the abnormal lymphadenopathy seen could be due to postoperative changes The peritoneal lesion seen may or may not be related to disease Her tumor marker is within normal range I will call her back within the week after discussion with pathologist Currently, she is not symptomatic My preference would be to repeat imaging study again in 6 to 8 weeks for further follow-up  Elevated blood pressure reading Her blood pressure is profoundly elevated likely due to anxiety Observe closely for now  No orders of the defined types were placed in this encounter.   All questions were answered. The patient knows to call the clinic with any problems, questions or concerns. The total time spent in the appointment was 40 minutes encounter with patients including review of chart and various tests results, discussions about plan of care and coordination of care plan   Ni Gorsuch, MD 04/18/2021 4:35 PM  INTERVAL HISTORY: Please see below for problem oriented charting. she returns for review of CT imaging after completion of adjuvant treatment The patient appears anxious She denies side effects from recent treatment Denies abdominal pain or changes in bowel habits  REVIEW OF SYSTEMS:   Constitutional: Denies fevers, chills or  abnormal weight loss Eyes: Denies blurriness of vision Ears, nose, mouth, throat, and face: Denies mucositis or sore throat Respiratory: Denies cough, dyspnea or wheezes Cardiovascular: Denies palpitation, chest discomfort or lower extremity swelling Gastrointestinal:  Denies nausea, heartburn or change in bowel habits Skin: Denies abnormal skin rashes Lymphatics: Denies new lymphadenopathy or easy bruising Neurological:Denies numbness, tingling or new weaknesses Behavioral/Psych: Mood is stable, no new changes  All other systems were reviewed with the patient and are negative.  I have reviewed the past medical history, past surgical history, social history and family history with the patient and they are unchanged from previous note.  ALLERGIES:  is allergic to codeine.  MEDICATIONS:  Current Outpatient Medications  Medication Sig Dispense Refill   Calcium Carb-Cholecalciferol (CALCIUM 600+D3 PO) Take 2 tablets by mouth in the morning. (Patient not taking: Reported on 01/14/2021)     levothyroxine (SYNTHROID) 50 MCG tablet TAKE 1 TABLET BY MOUTH EVERY DAY 90 tablet 3   lidocaine-prilocaine (EMLA) cream Apply 1 application topically as needed. 30 g 0   LORazepam (ATIVAN) 0.5 MG tablet Take 1 pill 1 hour before CT, may repeat in 30 mins if no effect 2 tablet 0   vitamin B-12 (CYANOCOBALAMIN) 1000 MCG tablet Take 1,000 mcg by mouth in the morning.     vitamin C (ASCORBIC ACID) 500 MG tablet Take 500 mg by mouth in the morning.     Vitamin D, Ergocalciferol, (DRISDOL) 1.25 MG (50000 UNIT) CAPS capsule Take 1 capsule (50,000 Units total) by mouth every 7 (seven) days. 12 capsule 0   No current facility-administered medications for this visit.      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 There is a large, cystic mass of the right ovary with peripheral nodular soft tissue components measuring at least 19.8 x 13.3 x 19.8 cm. Findings are consistent with primary ovarian malignancy. 2. Minimal stranding of the omentum in the ventral abdomen and minimal thickening of the peritoneum in the right lower quadrant in the right paracolic gutter without evidence of discrete omental or peritoneal nodularity. Findings are modestly suspicious for early peritoneal metastatic disease. 3. No lymphadenopathy or other evidence of metastatic disease in the abdomen or pelvis. 4. Polyp or adherent gallstone near the gallbladder fundus, better assessed by prior ultrasound. 5. Large burden of stool throughout the colon and rectum.   Aortic Atherosclerosis (ICD10-I70.0).   12/03/2020 Pathology Results   SURGICAL PATHOLOGY  CASE: [id number]  PATIENT: Brooke Khan  Surgical Pathology Report   Clinical History: Ovarian mass (crm)    FINAL MICROSCOPIC DIAGNOSIS:   A. UTERUS, CERVIX, BILATERAL FALLOPIAN TUBES AND OVARIES:  Ovary, right  -  Endometrioid carcinoma, FIGO grade 3  -  See oncology table and comment below   Ovary, left:  -  No carcinoma identified   Bilateral Fallopian tubes:  -  No carcinoma identified   Uterus:  -  No carcinoma identified   Cervix:  -  No carcinoma identified   B. LYMPH NODES, RIGHT PARAAORTIC, RESECTION:  -  No carcinoma identified in six lymph nodes (0/6)   C. LYMPH NODES, LEFT PARAAORTIC, RESECTION:  -  No carcinoma identified in five lymph nodes (0/5)   D. LYMPH NODES, RIGHT PELVIC, RESECTION:  -  No carcinoma identified in five lymph nodes (0/5)   E. LYMPH NODES, LEFT PELVIC, RESECTION:  -  No carcinoma identified in three lymph nodes (0/3)   F. OMENTUM, OMENTECTOMY:  -  No carcinoma identified   G. PERITONEUM, ANTERIOR PELVIC, BIOPSY:  -  No carcinoma identified   H. PERITONEUM, RIGHT PELVIC, BIOPSY:  -  No  carcinoma identified   I. PERITONEUM, LEFT PELVIC, BIOPSY:  -  No carcinoma identified   J. PERITONEUM, POSTERIOR PELVIC, BIOPSY:  -  No carcinoma identified   K. PERITONEUM, RIGHT ABDOMINAL, BIOPSY:  -  No carcinoma identified   L. PERITONEUM, LEFT ABDOMINAL, BIOPSY:  -  No carcinoma identified    ONCOLOGY TABLE:   OVARY or FALLOPIAN TUBE or PRIMARY PERITONEUM: Resection   Procedure: Hysterectomy with bilateral salpingo-oophorectomy  Specimen Integrity: Intact  Tumor Site: Ovary, right  Tumor Size: 1.5 cm  Histologic Type: Endometrioid carcinoma  Histologic Grade: FIGO grade 3  Ovarian Surface Involvement: Not identified  Fallopian Tube Surface Involvement: Not identified  Other Tissue/ Organ Involvement: Not applicable  Largest Extrapelvic Peritoneal Focus: Not applicable  Peritoneal/Ascitic Fluid Involvement: Malignant cells not identified  Chemotherapy Response Score (CRS): Not applicable, no known presurgical therapy  Regional Lymph Nodes:       Number of Nodes with Metastasis Greater than 10 mm: 0       Number of Nodes with Metastasis 10 mm or Less (excludes isolated  tumor cells): 0       Number of Nodes with Isolated Tumor Cells (0.2 mm or less): 0       Number of Lymph Nodes Examined:   19  Distant Metastasis:       Distant Site(s) Involved: Not applicable  Pathologic Stage Classification (pTNM, AJCC 8th Edition): pT1a, pN0  Ancillary Studies: Can be performed upon request  Representative Tumor Block: A8  Comment(s): Dr. Vic Ripper reviewed the case and agrees with the above diagnosis.     12/03/2020 Surgery   Surgeon: Donaciano Eva    Operation: Robotic-assisted laparoscopic total hysterectomy with bilateral salpingoophorectomy, omentectomy, pelvic and para-aortic lymphadnectomy.     Surgeon: Donaciano Eva    Operative Findings:  : 20cm cystic mass, smooth. Attachments between inferior aspect of cystic mass and posterior pelvic peritoneum. Normal  appearing omentum and nodes. Invasive malignancy on evaluation. No gross extra-ovarian disease. Clinical stage I disease. No gross residual disease.    12/11/2020 Cancer Staging   Staging form: Ovary, Fallopian Tube, and Primary Peritoneal Carcinoma, AJCC 8th Edition - Pathologic stage from 12/11/2020: FIGO Stage IA (pT1a, pN0, cM0) - Signed by [name], MD on 12/11/2020 Stage prefix: Initial diagnosis    01/10/2021 Genetic Testing   Negative germline and somatic genetic testing: no pathogenic variants detected in [employer] My[facility] Panel.   No somatic variants detected in [employer] [facility] HRD testing.  Genomic instaiblity score is negative.  The report dates are January 10, 2021.    The [facility] gene panel offered by [employer] includes sequencing and deletion/duplication testing of the following 48 genes: APC, ATM, AXIN2, BAP1, BARD1, BMPR1A, BRCA1, BRCA2, BRIP1, CHD1, CDK4, CDKN2A(p16 and p14ARF), CHEK2, CTNNA1, EGFR, EPCAM, FH, FLCN, GREM1, HOXB13, MEN1, MET, MITF , MLH1, MSH2, MSH3, MSH6, MUTYH, NTHL1, PALB2, PMS2, POLD1, POLE, PTEN, RAD51C, RAD51D, RET, SDHA, SDHB, SDHC, SDHD, SMAD4, STK11,TERT, TP53, TSC1, TSC2, and VHL.    01/15/2021 - 03/17/2021 Chemotherapy   Patient is on Treatment Plan : OVARIAN Carboplatin (AUC 6) / Paclitaxel (175) q21d x 6 cycles     04/15/2021 Tumor Marker   Patient's tumor was tested for the following markers: Ca-125. Results of the tumor marker test revealed 7.7.   04/16/2021 Imaging   1. New left periaortic adenopathy and new left omental and mesenteric root nodules, indicative of metastatic disease. 2. Right lower lobe nodules are stable. 3. Hepatic steatosis. 4. Cholelithiasis. 5. Left renal stone. 6.  Aortic atherosclerosis (ICD10-I70.0).     PHYSICAL EXAMINATION: ECOG PERFORMANCE STATUS: 0 - Asymptomatic  Vitals:   04/17/21 1138  BP: (!) 183/74  Pulse: 89  Resp: 18  Temp: 98 F (36.7 C)  SpO2: 100%   Filed Weights   04/17/21 1138   Weight: 167 lb 12.8 oz (76.1 kg)    GENERAL:alert, no distress and comfortable NEURO: alert & oriented x 3 with fluent speech, no focal motor/sensory deficits  LABORATORY DATA:  I have reviewed the data as listed    Component Value Date/Time   NA 145 04/14/2021 1522   K 3.9 04/14/2021 1522   CL 106 04/14/2021 1522   CO2 27 04/14/2021 1522   GLUCOSE 96 04/14/2021 1522   BUN 17 04/14/2021 1522   CREATININE 0.81 04/14/2021 1522   CALCIUM 9.6 04/14/2021 1522   PROT 7.6 04/14/2021 1522   ALBUMIN 4.3 04/14/2021 1522   AST 19 04/14/2021 1522   ALT 26 04/14/2021 1522   ALKPHOS 79 04/14/2021 1522   BILITOT 0.2 (L) 04/14/2021 1522   GFRNONAA >60 04/14/2021 1522    No results found for: SPEP, UPEP  Lab Results  Component Value Date   WBC 4.5 04/14/2021   NEUTROABS 2.1 04/14/2021  HGB 12.4 04/14/2021   HCT 37.5 04/14/2021   MCV 95.4 04/14/2021   PLT 221 04/14/2021      Chemistry      Component Value Date/Time   NA 145 04/14/2021 1522   K 3.9 04/14/2021 1522   CL 106 04/14/2021 1522   CO2 27 04/14/2021 1522   BUN 17 04/14/2021 1522   CREATININE 0.81 04/14/2021 1522      Component Value Date/Time   CALCIUM 9.6 04/14/2021 1522   ALKPHOS 79 04/14/2021 1522   AST 19 04/14/2021 1522   ALT 26 04/14/2021 1522   BILITOT 0.2 (L) 04/14/2021 1522       RADIOGRAPHIC STUDIES: I have reviewed multiple imaging studies with the patient I have personally reviewed the radiological images as listed and agreed with the findings in the report. CT ABDOMEN PELVIS W CONTRAST  Result Date: 04/16/2021 CLINICAL DATA:  Ovarian cancer, assess treatment response. EXAM: CT ABDOMEN AND PELVIS WITH CONTRAST TECHNIQUE: Multidetector CT imaging of the abdomen and pelvis was performed using the standard protocol following bolus administration of intravenous contrast. CONTRAST:  15m OMNIPAQUE IOHEXOL 350 MG/ML SOLN COMPARISON:  11/20/2020. FINDINGS: Lower chest: Right lower lobe nodules measure up  to 4 mm (6/5), unchanged. No new pulmonary nodules. Minimal dependent atelectasis bilaterally. Heart is at the upper limits of normal in size. No pericardial or pleural effusion. Distal esophagus is unremarkable. Distal periesophageal lymph nodes are not enlarged by CT size criteria. Hepatobiliary: Liver is decreased in attenuation diffusely but is otherwise unremarkable. Small stone in the gallbladder. No biliary ductal dilatation. Pancreas: Negative. Spleen: Negative. Adrenals/Urinary Tract: Adrenal glands and right kidney are unremarkable. Punctate stone in the left kidney. Left kidney is otherwise unremarkable. Ureters are decompressed. Bladder is grossly unremarkable. Stomach/Bowel: Stomach, small bowel, appendix and colon are unremarkable. Vascular/Lymphatic: Atherosclerotic calcification of the aorta. Two new low-attenuation left periaortic lymph nodes measure up to 1.6 cm (2/52). Similar 7 mm short axis ileocolic mesenteric lymph node (2/56) pleural Reproductive: Hysterectomy. Interval resection of a large right ovarian mass. No residual or recurrent adnexal Mass. Other: No free fluid. New nodule at the root of the small bowel mesentery measures 6 mm (2/36). A nodule in the left lateral omentum measures 5 mm (2/51), not seen on the prior exam. Musculoskeletal: Mild degenerative changes in the spine. IMPRESSION: 1. New left periaortic adenopathy and new left omental and mesenteric root nodules, indicative of metastatic disease. 2. Right lower lobe nodules are stable. 3. Hepatic steatosis. 4. Cholelithiasis. 5. Left renal stone. 6.  Aortic atherosclerosis (ICD10-I70.0). Electronically Signed   By: MLorin PicketM.D.   On: 04/16/2021 09:01

## 2021-04-18 NOTE — Assessment & Plan Note (Signed)
Her blood pressure is profoundly elevated likely due to anxiety Observe closely for now

## 2021-04-21 ENCOUNTER — Telehealth: Payer: Self-pay

## 2021-04-21 NOTE — Telephone Encounter (Signed)
Returned patient's call and informed her that she may proceed with dental cleanings now that she has completed her treatment. Patient verbalized an understanding of the information.

## 2021-04-21 NOTE — Telephone Encounter (Signed)
-----   Message from [name], MD sent at 04/21/2021  9:17 AM EST ----- Regarding: RE: Dental Cleaning Yes she may proceed ----- Message ----- From: Jolaine Click, RN Sent: 04/21/2021   9:16 AM EST To: [name], MD Subject: Dental Cleaning                                This patient had a question related to dental cleanings: when would she be able to go back for a dental cleaning, now that she has completed treatment?

## 2021-04-28 ENCOUNTER — Inpatient Hospital Stay: Payer: BC Managed Care – PPO

## 2021-04-28 ENCOUNTER — Other Ambulatory Visit: Payer: Self-pay | Admitting: Hematology and Oncology

## 2021-04-28 ENCOUNTER — Telehealth: Payer: Self-pay | Admitting: Hematology and Oncology

## 2021-04-28 ENCOUNTER — Inpatient Hospital Stay: Payer: BC Managed Care – PPO | Admitting: Hematology and Oncology

## 2021-04-28 ENCOUNTER — Other Ambulatory Visit: Payer: Self-pay | Admitting: Oncology

## 2021-04-28 DIAGNOSIS — C561 Malignant neoplasm of right ovary: Secondary | ICD-10-CM

## 2021-04-28 NOTE — Telephone Encounter (Signed)
The case was reviewed at the GYN oncology tumor board The abnormal lymph node could be postoperative changes The abnormal peritoneal nodule could be benign lymph nodes Overall, the overall consensus from the tumor board is to observe with another imaging study in a few months I reviewed with the patient and she is in agreement I will schedule follow-up appointment with repeat CT imaging

## 2021-04-28 NOTE — Progress Notes (Signed)
Gynecologic Oncology Multi-Disciplinary Disposition Conference Note  Date of the Conference: 04/28/2021  Patient Name: Brooke Khan  Referring Provider: Dr. Julien Girt Primary GYN Oncologist: Dr. 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 Disposition is to a repeat CT scan in 8 weeks.   This Multidisciplinary conference took place involving physicians from [place], [place], Radiation Oncology, Pathology, Radiology along with the Gynecologic Oncology Nurse Practitioner and RN.  Comprehensive assessment of the patient's malignancy, staging, need for surgery, chemotherapy, radiation therapy, and need for further testing were reviewed. Supportive measures, both inpatient and following discharge were also discussed. The recommended plan of care is documented. Greater than 35 minutes were spent correlating and coordinating this patient's care.

## 2021-05-14 ENCOUNTER — Telehealth: Payer: Self-pay

## 2021-05-14 NOTE — Telephone Encounter (Signed)
-----   Message from [name], MD sent at 05/14/2021  8:58 AM EST ----- Regarding: Ct for 2/7 Can you assist in scheduling CT for 2/7?

## 2021-05-14 NOTE — Telephone Encounter (Signed)
Called and given appt for CT scan on 2/7 at 0930, arrive at 0915, NPO 4 hours prior, drink the 1st bottle of contrast at 0730 and 2nd bottle at 0830. Lab appt at 0830 on 2/7 at [facility]. She verbalized understanding.

## 2021-05-18 NOTE — Progress Notes (Signed)
Cardiology Office Note:    Date:  05/21/2021   ID:  Brooke Khan, DOB 06/23/1965, MRN [id number]  PCP:  [name], MD   [facility] Providers Cardiologist:  None {    Referring MD: [name], MD     History of Present Illness:    Brooke Khan is a 56 y.o. female with a hx of HLD, hypothyroidism, and ovarian cancer on chemo with carboplatin/paclitaxel who was referred by Dr. Yong Channel for further management of bradycardia.   The patient states that she was notified by her apple watch that her HR would drop to the 30-40s at night while she was sleeping. She was asymptomatic at that time. She has no known history of OSA and states her husband says she does not snore or have apneic episodes. She feels well rested in the morning with no significant daytime fatigue. Cardiac monitor with nocturnal bradycardia but no pauses or significant arrhythmias  Overall, the patient feels well with no chest pain, lightheadedness, dizziness, syncope, LE edema. She is active and is able to play tennis for about 28min without needing to stop.   Notably, she was recently diagnosed with ovarian cancer and is now s/p surgical resection and chemo.   Past Medical History:  Diagnosis Date   Bradycardia    Chicken pox    Elevated blood pressure reading    Epigastric abdominal pain    Family history of breast cancer 12/26/2020   Family history of prostate cancer 12/26/2020   Heart murmur    since childhood- not always heard   Hyperglycemia    Hyperlipidemia    borderline- no meds   Hypothyroidism (acquired)    Lightheaded    Pre-diabetes    Vitamin D deficiency     Past Surgical History:  Procedure Laterality Date   CESAREAN SECTION     x2   cosmetic knee surgery     as child   LAPAROTOMY N/A 12/03/2020   Procedure: MINI LAPAROTOMY FOR CYST DECOMPRESSION;  Surgeon: Everitt Amber, MD;  Location: [facility];  Service: Gynecology;  Laterality: N/A;   ROBOTIC ASSISTED TOTAL  HYSTERECTOMY WITH BILATERAL SALPINGO OOPHERECTOMY N/A 12/03/2020   Procedure: XI ROBOTIC ASSISTED TOTAL HYSTERECTOMY WITH BILATERAL SALPINGO OOPHORECTOMY WITH PARA-AORTIC AND PELVIC LYMPHADNECTOMY AND OMENTECTOMY;  Surgeon: Everitt Amber, MD;  Location: [facility];  Service: Gynecology;  Laterality: N/A;    Current Medications: Current Meds  Medication Sig   amLODipine (NORVASC) 5 MG tablet Take 1 tablet (5 mg total) by mouth daily.   calcium carbonate (TUMS - DOSED IN MG ELEMENTAL CALCIUM) 500 MG chewable tablet Chew 1 tablet by mouth daily.   Cholecalciferol (VITAMIN D) 125 MCG (5000 UT) CAPS Take by mouth.   levothyroxine (SYNTHROID) 50 MCG tablet TAKE 1 TABLET BY MOUTH EVERY DAY   rosuvastatin (CRESTOR) 5 MG tablet Take 1 tablet (5 mg total) by mouth daily.   vitamin B-12 (CYANOCOBALAMIN) 1000 MCG tablet Take 1,000 mcg by mouth in the morning.   vitamin C (ASCORBIC ACID) 500 MG tablet Take 500 mg by mouth in the morning.     Allergies:   Codeine   Social History   Socioeconomic History   Marital status: Married    Spouse name: Not on file   Number of children: Not on file   Years of education: Not on file   Highest education level: Not on file  Occupational History   Not on file  Tobacco Use   Smoking status: Never  Smokeless tobacco: Never  Vaping Use   Vaping Use: Never used  Substance and Sexual Activity   Alcohol use: Yes    Alcohol/week: 0.0 standard drinks    Comment: occ; twice a month    Drug use: No   Sexual activity: Yes    Partners: Male    Comment: husband  Other Topics Concern   Not on file  Social History Narrative   Married 30 years October 2020. Daughter went to [place]. Son went to [place]. In 2020, daughter Corporate treasurer in [place], son moving to [place] and to work for [employer] in Engineer, mining. 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       Hobbies: tennis, plays mahjong in womens group      Social Determinants of Health    Financial Resource Strain: Not on file  Food Insecurity: Not on file  Transportation Needs: Not on file  Physical Activity: Not on file  Stress: Not on file  Social Connections: Not on file     Family History: The patient's family history includes Arthritis in her maternal grandfather; Breast cancer (age of onset: 26) in her maternal aunt; Cancer in her cousin; Dementia in her father; Diabetes in her father, maternal grandmother, and paternal grandfather; Elevated Lipids in her father and mother; Healthy in her brother; Heart disease in her father; Hypertension in her father; Lymphoma in her paternal uncle; Prostate cancer in her father and maternal uncle; Skin cancer in her father. There is no history of Colon cancer, Colon polyps, Esophageal cancer, Rectal cancer, or Stomach cancer.  ROS:   Please see the history of present illness.    Review of Systems  Constitutional:  Negative for malaise/fatigue.  Respiratory:  Negative for shortness of breath.   Cardiovascular:  Negative for chest pain, palpitations, orthopnea, claudication, leg swelling and PND.  Gastrointestinal:  Negative for blood in stool and melena.  Genitourinary:  Negative for hematuria.  Neurological:  Negative for dizziness and loss of consciousness.    EKGs/Labs/Other Studies Reviewed:    The following studies were reviewed today: Cardiac Monitor 11/2020: Patch Wear Time:  5 days and 17 hours (2022-07-07T12:52:11-398 to 2022-07-13T06:04:12-398)   Patient had a min HR of 31 bpm, max HR of 102 bpm, and avg HR of 54 bpm. Predominant underlying rhythm was Sinus Rhythm. Isolated SVEs were rare (<1.0%), SVE Couplets were rare (<1.0%), and SVE Triplets were rare (<1.0%). Isolated VEs were rare (<1.0%),  and no VE Couplets or VE Triplets were present.    Nocturnal bradycardia present to a heart rate of 30 7:25 AM/1:15 AM/5:55 AM Occasional ventricular ectopics and supraventricular ectopics   No significant  arrhythmias  EKG:  EKG is  ordered today.  The ekg ordered today demonstrates NSR with HR 70  Recent Labs: 09/03/2020: TSH 2.27 04/14/2021: ALT 26; BUN 17; Creatinine 0.81; Hemoglobin 12.4; Platelet Count 221; Potassium 3.9; Sodium 145  Recent Lipid Panel    Component Value Date/Time   CHOL 166 12/23/2019 0000   TRIG 113 12/23/2019 0000   HDL 39 12/23/2019 0000   CHOLHDL 5 09/07/2018 1025   VLDL 24.2 09/07/2018 1025   LDLCALC 106 12/23/2019 0000   LDLDIRECT 118.0 03/14/2015 0832     Risk Assessment/Calculations:           Physical Exam:    VS:  BP (!) 150/80    Pulse 70    Ht 5\' 2"  (1.575 m)    Wt 173 lb (78.5  kg)    LMP 05/11/2014    SpO2 97%    BMI 31.64 kg/m     Wt Readings from Last 3 Encounters:  05/21/21 173 lb (78.5 kg)  05/19/21 171 lb (77.6 kg)  04/17/21 167 lb 12.8 oz (76.1 kg)     GEN:  Well nourished, well developed in no acute distress HEENT: Normal NECK: No JVD; No carotid bruits CARDIAC: RRR, no murmurs, rubs, gallops RESPIRATORY:  Clear to auscultation without rales, wheezing or rhonchi  ABDOMEN: Soft, non-tender, non-distended MUSCULOSKELETAL:  No edema; No deformity  SKIN: Warm and dry NEUROLOGIC:  Alert and oriented x 3 PSYCHIATRIC:  Normal affect   ASSESSMENT:    1. Bradycardia   2. Hyperlipidemia, unspecified hyperlipidemia type   3. Medication management   4. History of chemotherapy   5. Ovarian cancer, right ([place])   6. Aortic atherosclerosis ([place])   7. Primary hypertension   8. Pure hypercholesterolemia   9. Obesity (BMI 30.0-34.9)    PLAN:    In order of problems listed above:  #Nocturnal Bradycardia: Patient with episodes of HR 30-40s while at night. No known OSA and patient states her husband says she does not snore or have apneic episodes that he knows of.  Overall, she feels well and exercises without symptoms. No daytime somnolence or fatigue. Not on nodal agents. Discussed that we can do a sleep study to assess for OSA but  she would like to defer at this time as she is currently undergoing management for ovarian cancer. She is going to work on weight loss. Given lack of symptoms and appropriate augmentation of HR with activity, no further work-up needed at this time. -Declined sleep study -No nodal agents -Patient asymptomatic, will continue to monitor  #HLD #Aortic Atherosclerosis: LDL 106 with atherosclerosis noted on CT abdomen/pelvis. Patient amenable to start low dose statin. -Start crestor 5mg  daily -Repeat lipid panel in 6-8 weeks  #HTN: Elevated 140s mainly at home.  -Start amlodipine 5mg  daily -Will monitor with weight loss and adjust if needed  #Ovarian Cancer: S/p resection and chemo with carboplatin/paclitaxel. Doing well. Follows with Dr. Alvy Bimler. -Check TTE with strain for monitoring  #Obesity: BMI 31.6. Patient is working on weight loss and is very motivated to lose weight and eat healthy. Exercise recommendations: Goal of exercising for at least 30 minutes a day, at least 5 times per week.  Please exercise to a moderate exertion.  This means that while exercising it is difficult to speak in full sentences, however you are not so short of breath that you feel you must stop, and not so comfortable that you can carry on a full conversation.  Exertion level should be approximately a 5/10, if 10 is the most exertion you can perform.  Diet recommendations: Recommend a heart healthy diet such as the Mediterranean diet.  This diet consists of plant based foods, healthy fats, lean meats, olive oil.  It suggests limiting the intake of simple carbohydrates such as white breads, pastries, and pastas.  It also limits the amount of red meat, wine, and dairy products such as cheese that one should consume on a daily basis.        Medication Adjustments/Labs and Tests Ordered: Current medicines are reviewed at length with the patient today.  Concerns regarding medicines are outlined above.  Orders  Placed This Encounter  Procedures   Lipid Profile   EKG 12-Lead   ECHOCARDIOGRAM COMPLETE   Meds ordered this encounter  Medications   rosuvastatin (  CRESTOR) 5 MG tablet    Sig: Take 1 tablet (5 mg total) by mouth daily.    Dispense:  90 tablet    Refill:  2   amLODipine (NORVASC) 5 MG tablet    Sig: Take 1 tablet (5 mg total) by mouth daily.    Dispense:  90 tablet    Refill:  2    Patient Instructions  Medication Instructions:   START TAKING ROSUVASTATIN (CRESTOR) 5 MG BY MOUTH DAILY  START TAKING AMLODIPINE 5 MG BY MOUTH DAILY  *If you need a refill on your cardiac medications before your next appointment, please call your pharmacy*   Lab Work:  IN 6-8 WEEKS HERE IN THE OFFICE--CHECK LIPIDS AT THAT TIME--PLEASE COME FASTING TO THIS LAB APPOINTMENT  If you have labs (blood work) drawn today and your tests are completely normal, you will receive your results only by: [place] (if you have MyChart) OR A paper copy in the mail If you have any lab test that is abnormal or we need to change your treatment, we will call you to review the results.   Testing/Procedures:  Your physician has requested that you have an echocardiogram. Echocardiography is a painless test that uses sound waves to create images of your heart. It provides your doctor with information about the size and shape of your heart and how well your hearts chambers and valves are working. This procedure takes approximately one hour. There are no restrictions for this procedure.  DO ECHO WITH STRAIN PER DR. Johney Frame    Follow-Up: At [facility], you and your health needs are our priority.  As part of our continuing mission to provide you with exceptional heart care, we have created designated Provider Care Teams.  These Care Teams include your primary Cardiologist (physician) and Advanced Practice Providers (APPs -  Physician Assistants and Nurse Practitioners) who all work together to provide you  with the care you need, when you need it.  We recommend signing up for the patient portal called "MyChart".  Sign up information is provided on this After Visit Summary.  MyChart is used to connect with patients for Virtual Visits (Telemedicine).  Patients are able to view lab/test results, encounter notes, upcoming appointments, etc.  Non-urgent messages can be sent to your provider as well.   To learn more about what you can do with MyChart, go to [email or web address].    Your next appointment:   6 month(s)  The format for your next appointment:   In Person  Provider:   DR. Johney Frame    Signed, Freada Bergeron, MD  05/21/2021 3:12 PM    [place]

## 2021-05-19 ENCOUNTER — Other Ambulatory Visit: Payer: Self-pay

## 2021-05-19 ENCOUNTER — Encounter: Payer: Self-pay | Admitting: Gynecologic Oncology

## 2021-05-19 ENCOUNTER — Telehealth: Payer: Self-pay | Admitting: Gynecologic Oncology

## 2021-05-19 ENCOUNTER — Telehealth: Payer: Self-pay

## 2021-05-19 ENCOUNTER — Inpatient Hospital Stay: Payer: BC Managed Care – PPO | Attending: Gynecologic Oncology | Admitting: Gynecologic Oncology

## 2021-05-19 VITALS — BP 150/57 | HR 67 | Temp 97.9°F | Resp 16 | Ht 62.0 in | Wt 171.0 lb

## 2021-05-19 DIAGNOSIS — Z79899 Other long term (current) drug therapy: Secondary | ICD-10-CM | POA: Diagnosis not present

## 2021-05-19 DIAGNOSIS — R918 Other nonspecific abnormal finding of lung field: Secondary | ICD-10-CM | POA: Diagnosis not present

## 2021-05-19 DIAGNOSIS — R001 Bradycardia, unspecified: Secondary | ICD-10-CM | POA: Insufficient documentation

## 2021-05-19 DIAGNOSIS — C561 Malignant neoplasm of right ovary: Secondary | ICD-10-CM | POA: Insufficient documentation

## 2021-05-19 DIAGNOSIS — R011 Cardiac murmur, unspecified: Secondary | ICD-10-CM | POA: Insufficient documentation

## 2021-05-19 DIAGNOSIS — E559 Vitamin D deficiency, unspecified: Secondary | ICD-10-CM | POA: Insufficient documentation

## 2021-05-19 DIAGNOSIS — R935 Abnormal findings on diagnostic imaging of other abdominal regions, including retroperitoneum: Secondary | ICD-10-CM | POA: Insufficient documentation

## 2021-05-19 DIAGNOSIS — Z9221 Personal history of antineoplastic chemotherapy: Secondary | ICD-10-CM | POA: Diagnosis not present

## 2021-05-19 DIAGNOSIS — N898 Other specified noninflammatory disorders of vagina: Secondary | ICD-10-CM | POA: Insufficient documentation

## 2021-05-19 DIAGNOSIS — E039 Hypothyroidism, unspecified: Secondary | ICD-10-CM | POA: Diagnosis not present

## 2021-05-19 DIAGNOSIS — Z90722 Acquired absence of ovaries, bilateral: Secondary | ICD-10-CM | POA: Insufficient documentation

## 2021-05-19 DIAGNOSIS — Z90710 Acquired absence of both cervix and uterus: Secondary | ICD-10-CM | POA: Diagnosis not present

## 2021-05-19 NOTE — Addendum Note (Signed)
Addended by: Joylene John D on: 05/19/2021 02:01 PM   Modules accepted: Orders

## 2021-05-19 NOTE — Patient Instructions (Signed)
You today it was nice to meet you today.  I will release your biopsy results from the top of your vagina once back, hopefully later this week.  Overall, this looked most like scar tissue from where you have healed after surgery.  We will have a phone visit after your CT scan next month.  Please do not hesitate to call if you need anything before then.

## 2021-05-19 NOTE — Telephone Encounter (Signed)
Pt requests CT CAP be r/s for 06/16/21 instead of 0/9/19 d/t conflicting schedule. I called central scheduling and was able to change appt to 2/6 per pt request. Pt is aware she needs to be NPO 4 hr prior and is educated on when to drink contrast. Pt verbalized thanks and understanding.

## 2021-05-19 NOTE — Progress Notes (Signed)
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 Results of the tumor marker test revealed 62.7.   11/20/2020 Imaging   CT scan of abdomen and pelvis  1. There is a large, cystic mass of the right ovary with peripheral nodular soft tissue components measuring at least 19.8 x 13.3 x 19.8 cm. Findings are consistent with primary ovarian malignancy. 2. Minimal stranding of the omentum in the ventral abdomen and minimal thickening of the peritoneum in the right lower quadrant in the right paracolic gutter without evidence of discrete omental or peritoneal nodularity. Findings are modestly suspicious for early peritoneal metastatic disease. 3. No lymphadenopathy or other evidence of metastatic disease in the abdomen or pelvis. 4. Polyp or adherent gallstone near the gallbladder fundus, better assessed by prior ultrasound. 5. Large burden of stool throughout the colon and rectum.   Aortic Atherosclerosis (ICD10-I70.0).   12/03/2020 Pathology Results   SURGICAL PATHOLOGY  CASE: [id number]  PATIENT: Brooke Khan  Surgical Pathology Report   Clinical History: Ovarian mass (crm)    FINAL MICROSCOPIC DIAGNOSIS:   A. UTERUS, CERVIX, BILATERAL FALLOPIAN TUBES AND OVARIES:  Ovary, right  -  Endometrioid carcinoma, FIGO grade 3  -  See oncology table and comment below   Ovary, left:  -  No carcinoma identified   Bilateral Fallopian tubes:  -  No carcinoma identified   Uterus:  -  No carcinoma identified   Cervix:  -  No carcinoma identified   B. LYMPH NODES, RIGHT PARAAORTIC, RESECTION:  -  No carcinoma identified in six lymph  nodes (0/6)   C. LYMPH NODES, LEFT PARAAORTIC, RESECTION:  -  No carcinoma identified in five lymph nodes (0/5)   D. LYMPH NODES, RIGHT PELVIC, RESECTION:  -  No carcinoma identified in five lymph nodes (0/5)   E. LYMPH NODES, LEFT PELVIC, RESECTION:  -  No carcinoma identified in three lymph nodes (0/3)   F. OMENTUM, OMENTECTOMY:  -  No carcinoma identified   G. PERITONEUM, ANTERIOR PELVIC, BIOPSY:  -  No carcinoma identified   H. PERITONEUM, RIGHT PELVIC, BIOPSY:  -  No carcinoma identified   I. PERITONEUM, LEFT PELVIC, BIOPSY:  -  No carcinoma identified   J. PERITONEUM, POSTERIOR PELVIC, BIOPSY:  -  No carcinoma identified   K. PERITONEUM, RIGHT ABDOMINAL, BIOPSY:  -  No carcinoma identified   L. PERITONEUM, LEFT ABDOMINAL, BIOPSY:  -  No carcinoma identified    ONCOLOGY TABLE:   OVARY or FALLOPIAN TUBE or PRIMARY PERITONEUM: Resection   Procedure: Hysterectomy with bilateral salpingo-oophorectomy  Specimen Integrity: Intact  Tumor Site: Ovary, right  Tumor Size: 1.5 cm  Histologic Type: Endometrioid carcinoma  Histologic Grade: FIGO grade 3  Ovarian Surface Involvement: Not identified  Fallopian Tube Surface Involvement: Not identified  Other Tissue/ Organ Involvement: Not applicable  Largest Extrapelvic Peritoneal Focus: Not applicable  Peritoneal/Ascitic Fluid Involvement: Malignant cells not identified  Chemotherapy Response Score (CRS): Not applicable, no known presurgical therapy  Regional Lymph Nodes:       Number of Nodes with Metastasis Greater than 10 mm: 0       Number of Nodes with Metastasis  10 mm or Less (excludes isolated  tumor cells): 0       Number of Nodes with Isolated Tumor Cells (0.2 mm or less): 0       Number of Lymph Nodes Examined: 19  Distant Metastasis:       Distant Site(s) Involved: Not applicable  Pathologic Stage Classification (pTNM, AJCC 8th Edition): pT1a, pN0  Ancillary Studies: Can be performed upon request   Representative Tumor Block: A8  Comment(s): Dr. Vic Ripper reviewed the case and agrees with the above diagnosis.     12/03/2020 Surgery   Surgeon: Donaciano Eva    Operation: Robotic-assisted laparoscopic total hysterectomy with bilateral salpingoophorectomy, omentectomy, pelvic and para-aortic lymphadnectomy.     Surgeon: Donaciano Eva    Operative Findings:  : 20cm cystic mass, smooth. Attachments between inferior aspect of cystic mass and posterior pelvic peritoneum. Normal appearing omentum and nodes. Invasive malignancy on evaluation. No gross extra-ovarian disease. Clinical stage I disease. No gross residual disease.    12/11/2020 Cancer Staging   Staging form: Ovary, Fallopian Tube, and Primary Peritoneal Carcinoma, AJCC 8th Edition - Pathologic stage from 12/11/2020: FIGO Stage IA (pT1a, pN0, cM0) - Signed by [name], MD on 12/11/2020 Stage prefix: Initial diagnosis    01/10/2021 Genetic Testing   Negative germline and somatic genetic testing: no pathogenic variants detected in [facility] MyRisk Panel.   No somatic variants detected in [employer] [facility]Choice HRD testing.  Genomic instaiblity score is negative.  The report dates are January 10, 2021.    The [facility] gene panel offered by [employer] includes sequencing and deletion/duplication testing of the following 48 genes: APC, ATM, AXIN2, BAP1, BARD1, BMPR1A, BRCA1, BRCA2, BRIP1, CHD1, CDK4, CDKN2A(p16 and p14ARF), CHEK2, CTNNA1, EGFR, EPCAM, FH, FLCN, GREM1, HOXB13, MEN1, MET, MITF , MLH1, MSH2, MSH3, MSH6, MUTYH, NTHL1, PALB2, PMS2, POLD1, POLE, PTEN, RAD51C, RAD51D, RET, SDHA, SDHB, SDHC, SDHD, SMAD4, STK11,TERT, TP53, TSC1, TSC2, and VHL.    01/15/2021 - 03/17/2021 Chemotherapy   Patient is on Treatment Plan : OVARIAN Carboplatin (AUC 6) / Paclitaxel (175) q21d x 6 cycles     04/15/2021 Tumor Marker   Patient's tumor was tested for the following markers: Ca-125. Results of the tumor marker test revealed  7.7.   04/16/2021 Imaging   1. New left periaortic adenopathy and new left omental and mesenteric root nodules, indicative of metastatic disease. 2. Right lower lobe nodules are stable. 3. Hepatic steatosis. 4. Cholelithiasis. 5. Left renal stone. 6.  Aortic atherosclerosis (ICD10-I70.0).     Interval History: Patient reports overall doing well.  She has recovered from chemotherapy.  She notes some intermittent lingering neuropathy in her toes.  She denies any issues with balance or walking but notes that some of her toes go numb when it is cold outside.  She endorses regular bowel and bladder function.  She endorses a good appetite without nausea or emesis.  She denies any vaginal bleeding or discharge.  Denies any abdominal or pelvic pain.  Past Medical/Surgical History: Past Medical History:  Diagnosis Date   Bradycardia    Chicken pox    Elevated blood pressure reading    Epigastric abdominal pain    Family history of breast cancer 12/26/2020   Family history of prostate cancer 12/26/2020   Heart murmur    since childhood- not always heard   Hyperglycemia    Hyperlipidemia    borderline- no meds   Hypothyroidism (acquired)    Lightheaded    Pre-diabetes  Vitamin D deficiency     Past Surgical History:  Procedure Laterality Date   CESAREAN SECTION     x2   cosmetic knee surgery     as child   LAPAROTOMY N/A 12/03/2020   Procedure: MINI LAPAROTOMY FOR CYST DECOMPRESSION;  Surgeon: Everitt Amber, MD;  Location: [facility];  Service: Gynecology;  Laterality: N/A;   ROBOTIC ASSISTED TOTAL HYSTERECTOMY WITH BILATERAL SALPINGO OOPHERECTOMY N/A 12/03/2020   Procedure: XI ROBOTIC ASSISTED TOTAL HYSTERECTOMY WITH BILATERAL SALPINGO OOPHORECTOMY WITH PARA-AORTIC AND PELVIC LYMPHADNECTOMY AND OMENTECTOMY;  Surgeon: Everitt Amber, MD;  Location: [facility];  Service: Gynecology;  Laterality: N/A;    Family History  Problem Relation Age of Onset   Elevated Lipids Mother    Prostate cancer  Father        dx 38s   Elevated Lipids Father    Heart disease Father        mid 61s. had been remote smoker.    Hypertension Father    Diabetes Father        mid 34s   Dementia Father        vascular based   Skin cancer Father        SCC, dx after 67   Healthy Brother    Breast cancer Maternal Aunt 63   Prostate cancer Maternal Uncle        dx 50s   Lymphoma Paternal Uncle        dx after 27   Diabetes Maternal Grandmother    Arthritis Maternal Grandfather    Diabetes Paternal Grandfather    Cancer Cousin        unknown type; ?throat ?lymphoma d. 91   Colon cancer Neg Hx    Colon polyps Neg Hx    Esophageal cancer Neg Hx    Rectal cancer Neg Hx    Stomach cancer Neg Hx     Social History   Socioeconomic History   Marital status: Married    Spouse name: Not on file   Number of children: Not on file   Years of education: Not on file   Highest education level: Not on file  Occupational History   Not on file  Tobacco Use   Smoking status: Never   Smokeless tobacco: Never  Vaping Use   Vaping Use: Never used  Substance and Sexual Activity   Alcohol use: Yes    Alcohol/week: 0.0 standard drinks    Comment: occ; twice a month    Drug use: No   Sexual activity: Yes    Partners: Male    Comment: husband  Other Topics Concern   Not on file  Social History Narrative   Married 30 years October 2020. Daughter went to [place]. Son went to [place]. In 2020, daughter Corporate treasurer in [place], son moving to [place] and to work for [employer] in Engineer, mining. 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       Hobbies: tennis, plays mahjong in womens group      Social Determinants of Health   Financial Resource Strain: Not on file  Food Insecurity: Not on file  Transportation Needs: Not on file  Physical Activity: Not on file  Stress: Not on file  Social Connections: Not on file    Current Medications:  Current Outpatient Medications:    calcium  carbonate (TUMS - DOSED IN MG ELEMENTAL CALCIUM) 500 MG chewable tablet, Chew 1 tablet by mouth daily., Disp: , Rfl:  levothyroxine (SYNTHROID) 50 MCG tablet, TAKE 1 TABLET BY MOUTH EVERY DAY, Disp: 90 tablet, Rfl: 3   vitamin B-12 (CYANOCOBALAMIN) 1000 MCG tablet, Take 1,000 mcg by mouth in the morning., Disp: , Rfl:    vitamin C (ASCORBIC ACID) 500 MG tablet, Take 500 mg by mouth in the morning., Disp: , Rfl:    Vitamin D, Ergocalciferol, (DRISDOL) 1.25 MG (50000 UNIT) CAPS capsule, Take 1 capsule (50,000 Units total) by mouth every 7 (seven) days., Disp: 12 capsule, Rfl: 0   Calcium Carb-Cholecalciferol (CALCIUM 600+D3 PO), Take 2 tablets by mouth in the morning. (Patient not taking: Reported on 01/14/2021), Disp: , Rfl:    lidocaine-prilocaine (EMLA) cream, Apply 1 application topically as needed. (Patient not taking: Reported on 05/19/2021), Disp: 30 g, Rfl: 0   LORazepam (ATIVAN) 0.5 MG tablet, Take 1 pill 1 hour before CT, may repeat in 30 mins if no effect (Patient not taking: Reported on 05/19/2021), Disp: 2 tablet, Rfl: 0  Review of Systems: Denies appetite changes, fevers, chills, fatigue, unexplained weight changes. Denies hearing loss, neck lumps or masses, mouth sores, ringing in ears or voice changes. Denies cough or wheezing.  Denies shortness of breath. Denies chest pain or palpitations. Denies leg swelling. Denies abdominal distention, pain, blood in stools, constipation, diarrhea, nausea, vomiting, or early satiety. Denies pain with intercourse, dysuria, frequency, hematuria or incontinence. Denies hot flashes, pelvic pain, vaginal bleeding or vaginal discharge.   Denies joint pain, back pain or muscle pain/cramps. Denies itching, rash, or wounds. Denies dizziness, headaches, numbness or seizures. Denies swollen lymph nodes or glands, denies easy bruising or bleeding. Denies anxiety, depression, confusion, or decreased concentration.  Physical Exam: BP (!) 150/57 (BP Location:  Right Arm, Patient Position: Sitting)    Pulse 67    Temp 97.9 F (36.6 C) (Tympanic)    Resp 16    Ht '5\' 2"'  (1.575 m)    Wt 171 lb (77.6 kg)    LMP 05/11/2014    SpO2 99%    BMI 31.28 kg/m  General: Alert, oriented, no acute distress. HEENT: Normocephalic, atraumatic, sclera anicteric. Chest: Clear to auscultation bilaterally.  No wheezes or rhonchi. Cardiovascular: Regular rate and rhythm, no murmurs. Abdomen: Obese, soft, nontender.  Normoactive bowel sounds.  No masses or hepatosplenomegaly appreciated.  Well-healed incisions. Extremities: Grossly normal range of motion.  Warm, well perfused.  No edema bilaterally. Skin: No rashes or lesions noted. Lymphatics: No cervical, supraclavicular, or inguinal adenopathy. GU: Normal appearing external genitalia without erythema, excoriation, or lesions.  Speculum exam reveals mildly atrophic vaginal mucosa, polypoid beefy red 2 cm lesion noted along the cuff in the midline into the right.  No bleeding or discharge.  Bimanual exam reveals cuff intact, lesion described above is palpable although not firm or nodular.  No nodularity or masses.  Rectovaginal exam confirms these findings.  Laboratory & Radiologic Studies: None new  Assessment & Plan: Brooke Khan is a 56 y.o. 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  Completed 4 cycles of adjuvant chemotherapy.  Posttreatment imaging revealed concern for new para-aortic adenopathy as well as omental and mesenteric root nodules.  We discussed the difficulty as the patient did not have any postoperative imaging and the imaging we are comparing to as her preoperative imaging.  She is overall feeling very well and is asymptomatic.  CA-125, which was mildly elevated before surgery, is normal.  We reviewed her imaging  GYN tumor board last month and it was felt that CT findings could represent postoperative changes.  Plan  was for close interval imaging.  The patient is scheduled for CT scan in early February.  She and I discussed today that if CT scan is equivocal, PET scan could be pursued to help distinguish whether findings represent metastatic disease versus postoperative changes.  Genetic testing was negative.  I suspect lesion at the vaginal cuff is granulation tissue.  Biopsy performed today.  I will contact the patient with biopsy results later this week.   The patient is scheduled to see Dr. Alvy Bimler after her CT scan in February.  I will have a phone visit with her the following week.  32 minutes of total time was spent for this patient encounter, including preparation, face-to-face counseling with the patient and coordination of care, and documentation of the encounter.  Jeral Pinch, MD  Division of Gynecologic Oncology  Department of Obstetrics and Gynecology  [facility]

## 2021-05-21 ENCOUNTER — Ambulatory Visit: Payer: BC Managed Care – PPO | Admitting: Cardiology

## 2021-05-21 ENCOUNTER — Other Ambulatory Visit: Payer: Self-pay

## 2021-05-21 ENCOUNTER — Encounter: Payer: Self-pay | Admitting: Cardiology

## 2021-05-21 VITALS — BP 150/80 | HR 70 | Ht 62.0 in | Wt 173.0 lb

## 2021-05-21 DIAGNOSIS — C561 Malignant neoplasm of right ovary: Secondary | ICD-10-CM

## 2021-05-21 DIAGNOSIS — I10 Essential (primary) hypertension: Secondary | ICD-10-CM

## 2021-05-21 DIAGNOSIS — Z79899 Other long term (current) drug therapy: Secondary | ICD-10-CM | POA: Diagnosis not present

## 2021-05-21 DIAGNOSIS — R001 Bradycardia, unspecified: Secondary | ICD-10-CM

## 2021-05-21 DIAGNOSIS — Z9221 Personal history of antineoplastic chemotherapy: Secondary | ICD-10-CM

## 2021-05-21 DIAGNOSIS — E785 Hyperlipidemia, unspecified: Secondary | ICD-10-CM | POA: Diagnosis not present

## 2021-05-21 DIAGNOSIS — I700 Atherosclerosis of aorta: Secondary | ICD-10-CM

## 2021-05-21 DIAGNOSIS — E669 Obesity, unspecified: Secondary | ICD-10-CM

## 2021-05-21 DIAGNOSIS — E7800 Pure hypercholesterolemia, unspecified: Secondary | ICD-10-CM

## 2021-05-21 LAB — SURGICAL PATHOLOGY

## 2021-05-21 MED ORDER — AMLODIPINE BESYLATE 5 MG PO TABS: 5.0000 mg | ORAL_TABLET | Freq: Every day | ORAL | 2 refills | Status: DC

## 2021-05-21 MED ORDER — ROSUVASTATIN CALCIUM 5 MG PO TABS: 5.0000 mg | ORAL_TABLET | Freq: Every day | ORAL | 2 refills | Status: DC

## 2021-05-21 NOTE — Patient Instructions (Signed)
Medication Instructions:   START TAKING ROSUVASTATIN (CRESTOR) 5 MG BY MOUTH DAILY  START TAKING AMLODIPINE 5 MG BY MOUTH DAILY  *If you need a refill on your cardiac medications before your next appointment, please call your pharmacy*   Lab Work:  IN 6-8 WEEKS HERE IN THE OFFICE--CHECK LIPIDS AT THAT TIME--PLEASE COME FASTING TO THIS LAB APPOINTMENT  If you have labs (blood work) drawn today and your tests are completely normal, you will receive your results only by: [place] (if you have MyChart) OR A paper copy in the mail If you have any lab test that is abnormal or we need to change your treatment, we will call you to review the results.   Testing/Procedures:  Your physician has requested that you have an echocardiogram. Echocardiography is a painless test that uses sound waves to create images of your heart. It provides your doctor with information about the size and shape of your heart and how well your hearts chambers and valves are working. This procedure takes approximately one hour. There are no restrictions for this procedure.  DO ECHO WITH STRAIN PER DR. Johney Frame    Follow-Up: At [facility], you and your health needs are our priority.  As part of our continuing mission to provide you with exceptional heart care, we have created designated Provider Care Teams.  These Care Teams include your primary Cardiologist (physician) and Advanced Practice Providers (APPs -  Physician Assistants and Nurse Practitioners) who all work together to provide you with the care you need, when you need it.  We recommend signing up for the patient portal called "MyChart".  Sign up information is provided on this After Visit Summary.  MyChart is used to connect with patients for Virtual Visits (Telemedicine).  Patients are able to view lab/test results, encounter notes, upcoming appointments, etc.  Non-urgent messages can be sent to your provider as well.   To learn more about what you  can do with MyChart, go to [email or web address].    Your next appointment:   6 month(s)  The format for your next appointment:   In Person  Provider:   DR. Johney Frame

## 2021-05-22 ENCOUNTER — Telehealth: Payer: Self-pay

## 2021-05-22 DIAGNOSIS — Z1231 Encounter for screening mammogram for malignant neoplasm of breast: Secondary | ICD-10-CM | POA: Diagnosis not present

## 2021-05-22 NOTE — Telephone Encounter (Signed)
Spoke with Brooke Khan this afternoon regarding her biopsy results. Pt informed that per Joylene John, NP the biopsy showed granulation tissue and no cancer. Patient verbalized understanding.

## 2021-06-02 ENCOUNTER — Ambulatory Visit (HOSPITAL_COMMUNITY): Payer: BC Managed Care – PPO | Attending: Internal Medicine

## 2021-06-02 ENCOUNTER — Encounter: Payer: Self-pay | Admitting: Gynecologic Oncology

## 2021-06-02 DIAGNOSIS — Z9221 Personal history of antineoplastic chemotherapy: Secondary | ICD-10-CM | POA: Diagnosis not present

## 2021-06-02 DIAGNOSIS — I340 Nonrheumatic mitral (valve) insufficiency: Secondary | ICD-10-CM

## 2021-06-02 LAB — ECHOCARDIOGRAM COMPLETE
Area-P 1/2: 3.89 cm2
S' Lateral: 2.5 cm

## 2021-06-03 ENCOUNTER — Telehealth: Payer: Self-pay

## 2021-06-03 NOTE — Telephone Encounter (Signed)
Spoke with Brooke Khan this morning regarding her Mychart message to Dr.Tucker. Pt informed that spotting can be normal after a biopsy and to continue to monitor and let our office know if the spotting persists. Pt verbalized understanding.

## 2021-06-09 DIAGNOSIS — L814 Other melanin hyperpigmentation: Secondary | ICD-10-CM | POA: Diagnosis not present

## 2021-06-09 DIAGNOSIS — D1801 Hemangioma of skin and subcutaneous tissue: Secondary | ICD-10-CM | POA: Diagnosis not present

## 2021-06-09 DIAGNOSIS — L578 Other skin changes due to chronic exposure to nonionizing radiation: Secondary | ICD-10-CM | POA: Diagnosis not present

## 2021-06-09 DIAGNOSIS — L821 Other seborrheic keratosis: Secondary | ICD-10-CM | POA: Diagnosis not present

## 2021-06-13 ENCOUNTER — Other Ambulatory Visit: Payer: Self-pay | Admitting: Hematology and Oncology

## 2021-06-13 ENCOUNTER — Other Ambulatory Visit: Payer: Self-pay

## 2021-06-13 DIAGNOSIS — C561 Malignant neoplasm of right ovary: Secondary | ICD-10-CM

## 2021-06-16 ENCOUNTER — Ambulatory Visit (HOSPITAL_COMMUNITY)
Admission: RE | Admit: 2021-06-16 | Discharge: 2021-06-16 | Disposition: A | Discharge status: Home / Self Care | Payer: BC Managed Care – PPO | Source: Ambulatory Visit | Attending: Hematology and Oncology | Admitting: Hematology and Oncology

## 2021-06-16 ENCOUNTER — Other Ambulatory Visit: Payer: Self-pay

## 2021-06-16 ENCOUNTER — Encounter (HOSPITAL_COMMUNITY): Payer: Self-pay

## 2021-06-16 ENCOUNTER — Inpatient Hospital Stay: Payer: BC Managed Care – PPO | Attending: Gynecologic Oncology

## 2021-06-16 DIAGNOSIS — C561 Malignant neoplasm of right ovary: Secondary | ICD-10-CM | POA: Insufficient documentation

## 2021-06-16 DIAGNOSIS — F419 Anxiety disorder, unspecified: Secondary | ICD-10-CM | POA: Insufficient documentation

## 2021-06-16 DIAGNOSIS — Z90710 Acquired absence of both cervix and uterus: Secondary | ICD-10-CM | POA: Diagnosis not present

## 2021-06-16 DIAGNOSIS — C569 Malignant neoplasm of unspecified ovary: Secondary | ICD-10-CM | POA: Diagnosis not present

## 2021-06-16 DIAGNOSIS — M549 Dorsalgia, unspecified: Secondary | ICD-10-CM | POA: Insufficient documentation

## 2021-06-16 DIAGNOSIS — R599 Enlarged lymph nodes, unspecified: Secondary | ICD-10-CM | POA: Diagnosis not present

## 2021-06-16 DIAGNOSIS — Z79899 Other long term (current) drug therapy: Secondary | ICD-10-CM | POA: Insufficient documentation

## 2021-06-16 LAB — CBC WITH DIFFERENTIAL (CANCER CENTER ONLY)
Abs Immature Granulocytes: 0.01 10*3/uL (ref 0.00–0.07)
Basophils Absolute: 0 10*3/uL (ref 0.0–0.1)
Basophils Relative: 0 %
Eosinophils Absolute: 0.2 10*3/uL (ref 0.0–0.5)
Eosinophils Relative: 3 %
HCT: 41 % (ref 36.0–46.0)
Hemoglobin: 13.5 g/dL (ref 12.0–15.0)
Immature Granulocytes: 0 %
Lymphocytes Relative: 31 %
Lymphs Abs: 1.6 10*3/uL (ref 0.7–4.0)
MCH: 31.6 pg (ref 26.0–34.0)
MCHC: 32.9 g/dL (ref 30.0–36.0)
MCV: 96 fL (ref 80.0–100.0)
Monocytes Absolute: 0.3 10*3/uL (ref 0.1–1.0)
Monocytes Relative: 6 %
Neutro Abs: 3 10*3/uL (ref 1.7–7.7)
Neutrophils Relative %: 60 %
Platelet Count: 218 10*3/uL (ref 150–400)
RBC: 4.27 MIL/uL (ref 3.87–5.11)
RDW: 12.2 % (ref 11.5–15.5)
WBC Count: 5.1 10*3/uL (ref 4.0–10.5)
nRBC: 0 % (ref 0.0–0.2)

## 2021-06-16 LAB — CMP (CANCER CENTER ONLY)
ALT: 30 U/L (ref 0–44)
AST: 17 U/L (ref 15–41)
Albumin: 4.5 g/dL (ref 3.5–5.0)
Alkaline Phosphatase: 64 U/L (ref 38–126)
Anion gap: 6 (ref 5–15)
BUN: 21 mg/dL — ABNORMAL HIGH (ref 6–20)
CO2: 29 mmol/L (ref 22–32)
Calcium: 9.8 mg/dL (ref 8.9–10.3)
Chloride: 109 mmol/L (ref 98–111)
Creatinine: 0.7 mg/dL (ref 0.44–1.00)
GFR, Estimated: >60 mL/min (ref >60)
Glucose, Bld: 111 mg/dL — ABNORMAL HIGH (ref 70–99)
Potassium: 4.3 mmol/L (ref 3.5–5.1)
Sodium: 144 mmol/L (ref 135–145)
Total Bilirubin: 0.3 mg/dL (ref 0.3–1.2)
Total Protein: 7.4 g/dL (ref 6.5–8.1)

## 2021-06-16 MED ORDER — IOHEXOL 300 MG/ML  SOLN
100.0000 mL | Freq: Once | INTRAMUSCULAR | Status: AC | PRN
  Administered 2021-06-16: 100 mL via INTRAVENOUS

## 2021-06-16 MED ORDER — SODIUM CHLORIDE (PF) 0.9 % IJ SOLN
INTRAMUSCULAR | Status: AC
  Filled 2021-06-16: qty 50

## 2021-06-17 ENCOUNTER — Other Ambulatory Visit: Payer: BC Managed Care – PPO

## 2021-06-17 ENCOUNTER — Other Ambulatory Visit (HOSPITAL_COMMUNITY): Payer: BC Managed Care – PPO

## 2021-06-17 ENCOUNTER — Telehealth: Payer: BC Managed Care – PPO | Admitting: Gynecologic Oncology

## 2021-06-19 ENCOUNTER — Inpatient Hospital Stay (HOSPITAL_BASED_OUTPATIENT_CLINIC_OR_DEPARTMENT_OTHER): Payer: BC Managed Care – PPO | Admitting: Hematology and Oncology

## 2021-06-19 ENCOUNTER — Inpatient Hospital Stay: Payer: BC Managed Care – PPO

## 2021-06-19 ENCOUNTER — Other Ambulatory Visit: Payer: Self-pay

## 2021-06-19 ENCOUNTER — Encounter: Payer: Self-pay | Admitting: Hematology and Oncology

## 2021-06-19 DIAGNOSIS — M549 Dorsalgia, unspecified: Secondary | ICD-10-CM | POA: Diagnosis not present

## 2021-06-19 DIAGNOSIS — C561 Malignant neoplasm of right ovary: Secondary | ICD-10-CM | POA: Diagnosis not present

## 2021-06-19 DIAGNOSIS — F419 Anxiety disorder, unspecified: Secondary | ICD-10-CM | POA: Diagnosis not present

## 2021-06-19 DIAGNOSIS — R030 Elevated blood-pressure reading, without diagnosis of hypertension: Secondary | ICD-10-CM

## 2021-06-19 DIAGNOSIS — Z79899 Other long term (current) drug therapy: Secondary | ICD-10-CM | POA: Diagnosis not present

## 2021-06-19 DIAGNOSIS — M546 Pain in thoracic spine: Secondary | ICD-10-CM

## 2021-06-19 NOTE — Assessment & Plan Note (Signed)
Unfortunately, the lab personnel did not draw her Ca1 25 Her last Ca1 25 was within normal range I have reviewed CT imaging with the patient extensively There are no signs of clinical recurrence I do not feel strongly we need to repeat her labs I will reach out to GYN surgeon about this At present time, I felt that active surveillance would be appropriate I plan to see her again in 6 months with repeat blood work and CT imaging

## 2021-06-19 NOTE — Progress Notes (Signed)
[place] OFFICE PROGRESS NOTE  Patient Care Team: Brooke Olp, MD as PCP - General (Family Medicine)  ASSESSMENT & PLAN:  Ovarian cancer, right [facility]) Unfortunately, the lab personnel did not draw her Ca1 25 Her last Ca1 25 was within normal range I have reviewed CT imaging with the patient extensively There are no signs of clinical recurrence I do not feel strongly we need to repeat her labs I will reach out to GYN surgeon about this At present time, I felt that active surveillance would be appropriate I plan to see her again in 6 months with repeat blood work and CT imaging  Acute back pain Suspect this is related to degenerative arthritis I reviewed the CT imaging with the patient which show mild degenerative changes The patient is reassured  Elevated blood pressure reading Her blood pressure is elevated likely triggered by anxiety Observe closely We discussed the importance of dietary modification and exercise  Orders Placed This Encounter  Procedures   CT ABDOMEN PELVIS W CONTRAST    Standing Status:   Future    Standing Expiration Date:   06/19/2022    Order Specific Question:   If indicated for the ordered procedure, I authorize the administration of contrast media per Radiology protocol    Answer:   Yes    Order Specific Question:   Preferred imaging location?    Answer:   [facility]    Order Specific Question:   Radiology Contrast Protocol - do NOT remove file path    Answer:   [email or web address]    Order Specific Question:   Is patient pregnant?    Answer:   No    All questions were answered. The patient knows to call the clinic with any problems, questions or concerns. The total time spent in the appointment was 30 minutes encounter with patients including review of chart and various tests results, discussions about plan of care and coordination of care plan   Brooke Lark, MD 06/19/2021 11:22  AM  INTERVAL HISTORY: Please see below for problem oriented charting. she returns for surveillance monitoring and follow-up after completion of chemotherapy for early stage ovarian cancer She is doing well She has minor back pain recently Denies abdominal pain or changes in bowel habits No recent infection, fever or chills  REVIEW OF SYSTEMS:   Constitutional: Denies fevers, chills or abnormal weight loss Eyes: Denies blurriness of vision Ears, nose, mouth, throat, and face: Denies mucositis or sore throat Respiratory: Denies cough, dyspnea or wheezes Cardiovascular: Denies palpitation, chest discomfort or lower extremity swelling Gastrointestinal:  Denies nausea, heartburn or change in bowel habits Skin: Denies abnormal skin rashes Lymphatics: Denies new lymphadenopathy or easy bruising Neurological:Denies numbness, tingling or new weaknesses Behavioral/Psych: Mood is stable, no new changes  All other systems were reviewed with the patient and are negative.  I have reviewed the past medical history, past surgical history, social history and family history with the patient and they are unchanged from previous note.  ALLERGIES:  is allergic to codeine.  MEDICATIONS:  Current Outpatient Medications  Medication Sig Dispense Refill   amLODipine (NORVASC) 5 MG tablet Take 1 tablet (5 mg total) by mouth daily. 90 tablet 2   Calcium Carb-Cholecalciferol (CALCIUM 600+D3 PO) Take 2 tablets by mouth in the morning. (Patient not taking: Reported on 01/14/2021)     calcium carbonate (TUMS - DOSED IN MG ELEMENTAL CALCIUM) 500 MG chewable tablet Chew 1 tablet by mouth daily.  Cholecalciferol (VITAMIN D) 125 MCG (5000 UT) CAPS Take by mouth.     levothyroxine (SYNTHROID) 50 MCG tablet TAKE 1 TABLET BY MOUTH EVERY DAY 90 tablet 3   lidocaine-prilocaine (EMLA) cream Apply 1 application topically as needed. (Patient not taking: Reported on 05/19/2021) 30 g 0   LORazepam (ATIVAN) 0.5 MG tablet Take 1  pill 1 hour before CT, may repeat in 30 mins if no effect (Patient not taking: Reported on 05/19/2021) 2 tablet 0   rosuvastatin (CRESTOR) 5 MG tablet Take 1 tablet (5 mg total) by mouth daily. 90 tablet 2   vitamin B-12 (CYANOCOBALAMIN) 1000 MCG tablet Take 1,000 mcg by mouth in the morning.     vitamin C (ASCORBIC ACID) 500 MG tablet Take 500 mg by mouth in the morning.     Vitamin D, Ergocalciferol, (DRISDOL) 1.25 MG (50000 UNIT) CAPS capsule Take 1 capsule (50,000 Units total) by mouth every 7 (seven) days. (Patient not taking: Reported on 05/21/2021) 12 capsule 0   No current facility-administered medications for this visit.    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 Results of the tumor marker test revealed 62.7.   11/20/2020 Imaging   CT scan of abdomen and pelvis  1. There is a large, cystic mass of the right ovary with peripheral nodular soft tissue components measuring at least 19.8 x 13.3 x 19.8 cm. Findings are consistent with primary ovarian malignancy. 2. Minimal stranding of the omentum in the ventral abdomen and minimal thickening of the peritoneum in the right lower quadrant in the right paracolic gutter without evidence of discrete omental or peritoneal nodularity. Findings are modestly suspicious for early peritoneal metastatic disease. 3. No lymphadenopathy or other evidence of metastatic disease in the abdomen or pelvis. 4. Polyp or adherent gallstone near the gallbladder fundus, better assessed by prior ultrasound. 5. Large burden of stool throughout the colon and rectum.   Aortic Atherosclerosis (ICD10-I70.0).   12/03/2020 Pathology Results   SURGICAL PATHOLOGY  CASE: [id number]  PATIENT: Brooke Khan  Surgical  Pathology Report   Clinical History: Ovarian mass (crm)    FINAL MICROSCOPIC DIAGNOSIS:   A. UTERUS, CERVIX, BILATERAL FALLOPIAN TUBES AND OVARIES:  Ovary, right  -  Endometrioid carcinoma, FIGO grade 3  -  See oncology table and comment below   Ovary, left:  -  No carcinoma identified   Bilateral Fallopian tubes:  -  No carcinoma identified   Uterus:  -  No carcinoma identified   Cervix:  -  No carcinoma identified   B. LYMPH NODES, RIGHT PARAAORTIC, RESECTION:  -  No carcinoma identified in six lymph nodes (0/6)   C. LYMPH NODES, LEFT PARAAORTIC, RESECTION:  -  No carcinoma identified in five lymph nodes (0/5)   D. LYMPH NODES, RIGHT PELVIC, RESECTION:  -  No carcinoma identified in five lymph nodes (0/5)   E. LYMPH NODES, LEFT PELVIC, RESECTION:  -  No carcinoma identified in three lymph nodes (0/3)   F. OMENTUM, OMENTECTOMY:  -  No carcinoma identified   G. PERITONEUM, ANTERIOR PELVIC, BIOPSY:  -  No carcinoma identified   H. PERITONEUM, RIGHT PELVIC, BIOPSY:  -  No carcinoma identified   I. PERITONEUM, LEFT PELVIC, BIOPSY:  -  No carcinoma identified   J. PERITONEUM, POSTERIOR PELVIC, BIOPSY:  -  No carcinoma identified   K. PERITONEUM, RIGHT ABDOMINAL, BIOPSY:  -  No carcinoma identified   L. PERITONEUM, LEFT ABDOMINAL, BIOPSY:  -  No carcinoma identified    ONCOLOGY TABLE:   OVARY or FALLOPIAN TUBE or PRIMARY PERITONEUM: Resection   Procedure: Hysterectomy with bilateral salpingo-oophorectomy  Specimen Integrity: Intact  Tumor Site: Ovary, right  Tumor Size: 1.5 cm  Histologic Type: Endometrioid carcinoma  Histologic Grade: FIGO grade 3  Ovarian Surface Involvement: Not identified  Fallopian Tube Surface Involvement: Not identified  Other Tissue/ Organ Involvement: Not applicable  Largest Extrapelvic Peritoneal Focus: Not applicable  Peritoneal/Ascitic Fluid Involvement: Malignant cells not identified  Chemotherapy Response Score (CRS):  Not applicable, no known presurgical therapy  Regional Lymph Nodes:       Number of Nodes with Metastasis Greater than 10 mm: 0       Number of Nodes with Metastasis 10 mm or Less (excludes isolated  tumor cells): 0       Number of Nodes with Isolated Tumor Cells (0.2 mm or less): 0       Number of Lymph Nodes Examined: 19  Distant Metastasis:       Distant Site(s) Involved: Not applicable  Pathologic Stage Classification (pTNM, AJCC 8th Edition): pT1a, pN0  Ancillary Studies: Can be performed upon request  Representative Tumor Block: A8  Comment(s): Dr. Vic Ripper reviewed the case and agrees with the above diagnosis.     12/03/2020 Surgery   Surgeon: Donaciano Eva    Operation: Robotic-assisted laparoscopic total hysterectomy with bilateral salpingoophorectomy, omentectomy, pelvic and para-aortic lymphadnectomy.     Surgeon: Donaciano Eva    Operative Findings:  : 20cm cystic mass, smooth. Attachments between inferior aspect of cystic mass and posterior pelvic peritoneum. Normal appearing omentum and nodes. Invasive malignancy on evaluation. No gross extra-ovarian disease. Clinical stage I disease. No gross residual disease.    12/11/2020 Cancer Staging   Staging form: Ovary, Fallopian Tube, and Primary Peritoneal Carcinoma, AJCC 8th Edition - Pathologic stage from 12/11/2020: FIGO Stage IA (pT1a, pN0, cM0) - Signed by Brooke Lark, MD on 12/11/2020 Stage prefix: Initial diagnosis    01/10/2021 Genetic Testing   Negative germline and somatic genetic testing: no pathogenic variants detected in [employer] MyRisk Panel.   No somatic variants detected in [employer] MyChoice HRD testing.  Genomic instaiblity score is negative.  The report dates are January 10, 2021.    The [facility] gene panel offered by [employer] includes sequencing and deletion/duplication testing of the following 48 genes: APC, ATM, AXIN2, BAP1, BARD1, BMPR1A, BRCA1, BRCA2, BRIP1, CHD1, CDK4, CDKN2A(p16  and p14ARF), CHEK2, CTNNA1, EGFR, EPCAM, FH, FLCN, GREM1, HOXB13, MEN1, MET, MITF , MLH1, MSH2, MSH3, MSH6, MUTYH, NTHL1, PALB2, PMS2, POLD1, POLE, PTEN, RAD51C, RAD51D, RET, SDHA, SDHB, SDHC, SDHD, SMAD4, STK11,TERT, TP53, TSC1, TSC2, and VHL.    01/15/2021 - 03/17/2021 Chemotherapy   Patient is on Treatment Plan : OVARIAN Carboplatin (AUC 6) / Paclitaxel (175) q21d x 6 cycles     04/15/2021 Tumor Marker   Patient's tumor was tested for the following markers: Ca-125. Results of the tumor marker test revealed 7.7.   04/16/2021 Imaging   1. New left periaortic adenopathy and new left omental and mesenteric root nodules, indicative of metastatic disease. 2. Right lower lobe nodules are stable. 3. Hepatic steatosis. 4. Cholelithiasis. 5. Left renal stone. 6.  Aortic atherosclerosis (ICD10-I70.0).   06/17/2021 Imaging  1. Small RIGHT retroperitoneal nodule not significant changed. Recommend attention on follow-up. 2. Two low-density para-aortic lymph nodes unchanged from comparison exam. 3. No clear evidence omental or peritoneal metastasis. 4. Stable small pulmonary nodule in the RIGHT lower lobe.       PHYSICAL EXAMINATION: ECOG PERFORMANCE STATUS: 1 - Symptomatic but completely ambulatory  Vitals:   06/19/21 1010  BP: (!) 162/74  Pulse: 77  Resp: 18  Temp: 98 F (36.7 C)  SpO2: 100%   Filed Weights   06/19/21 1010  Weight: 172 lb (78 kg)    GENERAL:alert, no distress and comfortable NEURO: alert & oriented x 3 with fluent speech, no focal motor/sensory deficits  LABORATORY DATA:  I have reviewed the data as listed    Component Value Date/Time   NA 144 06/16/2021 0858   K 4.3 06/16/2021 0858   CL 109 06/16/2021 0858   CO2 29 06/16/2021 0858   GLUCOSE 111 (H) 06/16/2021 0858   BUN 21 (H) 06/16/2021 0858   CREATININE 0.70 06/16/2021 0858   CALCIUM 9.8 06/16/2021 0858   PROT 7.4 06/16/2021 0858   ALBUMIN 4.5 06/16/2021 0858   AST 17 06/16/2021 0858   ALT 30  06/16/2021 0858   ALKPHOS 64 06/16/2021 0858   BILITOT 0.3 06/16/2021 0858   GFRNONAA >60 06/16/2021 0858    No results found for: SPEP, UPEP  Lab Results  Component Value Date   WBC 5.1 06/16/2021   NEUTROABS 3.0 06/16/2021   HGB 13.5 06/16/2021   HCT 41.0 06/16/2021   MCV 96.0 06/16/2021   PLT 218 06/16/2021      Chemistry      Component Value Date/Time   NA 144 06/16/2021 0858   K 4.3 06/16/2021 0858   CL 109 06/16/2021 0858   CO2 29 06/16/2021 0858   BUN 21 (H) 06/16/2021 0858   CREATININE 0.70 06/16/2021 0858      Component Value Date/Time   CALCIUM 9.8 06/16/2021 0858   ALKPHOS 64 06/16/2021 0858   AST 17 06/16/2021 0858   ALT 30 06/16/2021 0858   BILITOT 0.3 06/16/2021 0858       RADIOGRAPHIC STUDIES: I have reviewed multiple imaging studies with the patient extensively I have personally reviewed the radiological images as listed and agreed with the findings in the report. CT ABDOMEN PELVIS W CONTRAST  Result Date: 06/16/2021 CLINICAL DATA:  Ovarian cancer.  Assess response to treatment. EXAM: CT ABDOMEN AND PELVIS WITH CONTRAST TECHNIQUE: Multidetector CT imaging of the abdomen and pelvis was performed using the standard protocol following bolus administration of intravenous contrast. RADIATION DOSE REDUCTION: This exam was performed according to the departmental dose-optimization program which includes automated exposure control, adjustment of the mA and/or kV according to patient size and/or use of iterative reconstruction technique. CONTRAST:  173m OMNIPAQUE IOHEXOL 300 MG/ML  SOLN COMPARISON:  04/14/2021 FINDINGS: Lower chest: 5 mm nodule in the RIGHT lower lobe (image 3/series 6. Adjacent small 5 mm nodule on image 6. These 2 small adjacent are present on CT 04/14/2021 and unchanged Hepatobiliary: Focal infiltration along the falciform ligament. No biliary duct dilatation. Common bile duct is normal. Pancreas: Pancreas is normal. No ductal dilatation. No  pancreatic inflammation. Spleen: Normal spleen Adrenals/urinary tract: Adrenal glands and kidneys are normal. The ureters and bladder normal. Stomach/Bowel: Stomach, small bowel, appendix, and cecum are normal. The colon and rectosigmoid colon are normal. Vascular/Lymphatic: Low-density lymph node LEFT aorta measures 13 mm (51/series 2) not changed from prior just inferior above  the bifurcation low-density lymph node measures 16 mm (image 54/2 compared to 16 mm. No pelvic lymphadenopathy. Reproductive: Post hysterectomy and oophorectomy. Other: No peritoneal metastasis identified. No intraperitoneal fluid. Small retroperitoneal node on the RIGHT just above the iliac crest measuring 5 mm (55/2) is not significant changed from 4 mm. Musculoskeletal: No aggressive osseous lesion IMPRESSION: 1. Small RIGHT retroperitoneal nodule not significant changed. Recommend attention on follow-up. 2. Two low-density para-aortic lymph nodes unchanged from comparison exam. 3. No clear evidence omental or peritoneal metastasis. 4. Stable small pulmonary nodule in the RIGHT lower lobe. Electronically Signed   By: Suzy Bouchard M.D.   On: 06/16/2021 21:20   ECHOCARDIOGRAM COMPLETE  Result Date: 06/02/2021    ECHOCARDIOGRAM REPORT   Patient Name:   Brooke Khan Date of Exam: 06/02/2021 Medical Rec #:  [id number]            Height:       62.0 in Accession #:    [id number]           Weight:       173.0 lb Date of Birth:  [birth year]            BSA:          1.797 m Patient Age:    56 years             BP:           150/80 mmHg Patient Gender: F                    HR:           69 bpm. Exam Location:  [place] Procedure: 2D Echo, 3D Echo, Cardiac Doppler, Color Doppler and Strain Analysis Indications:    Z92.21 H/o chemo  History:        Patient has no prior history of Echocardiogram examinations. H/o                 chemo, Arrythmias:Bradycardia; Risk Factors:Obesity,                 Hypertension and Dyslipidemia.   Sonographer:    Coralyn Helling RDCS Referring Phys: [id number] [place]  1. Left ventricular ejection fraction, by estimation, is 60 to 65%. The left ventricle has normal function. The left ventricle has no regional wall motion abnormalities. There is mild left ventricular hypertrophy. Left ventricular diastolic parameters were normal.  2. Right ventricular systolic function is normal. The right ventricular size is normal.  3. The mitral valve is normal in structure. Mild mitral valve regurgitation.  4. The aortic valve is tricuspid. Aortic valve regurgitation is not visualized. Aortic valve sclerosis is present, with no evidence of aortic valve stenosis. FINDINGS  Left Ventricle: Left ventricular ejection fraction, by estimation, is 60 to 65%. The left ventricle has normal function. The left ventricle has no regional wall motion abnormalities. The left ventricular internal cavity size was normal in size. There is  mild left ventricular hypertrophy. Left ventricular diastolic parameters were normal. Right Ventricle: The right ventricular size is normal. Right vetricular wall thickness was not assessed. Right ventricular systolic function is normal. Left Atrium: Left atrial size was normal in size. Right Atrium: Right atrial size was normal in size. Pericardium: There is no evidence of pericardial effusion. Mitral Valve: The mitral valve is normal in structure. Mild mitral valve regurgitation. Tricuspid Valve: The tricuspid valve is normal in structure. Tricuspid valve regurgitation is trivial. Aortic Valve: The  aortic valve is tricuspid. Aortic valve regurgitation is not visualized. Aortic valve sclerosis is present, with no evidence of aortic valve stenosis. Pulmonic Valve: The pulmonic valve was normal in structure. Pulmonic valve regurgitation is not visualized. Aorta: The aortic root and ascending aorta are structurally normal, with no evidence of dilitation. IAS/Shunts: No atrial level  shunt detected by color flow Doppler.  LEFT VENTRICLE PLAX 2D LVIDd:         4.30 cm   Diastology LVIDs:         2.50 cm   LV e' medial:    10.80 cm/s LV PW:         1.10 cm   LV E/e' medial:  9.7 LV IVS:        1.20 cm   LV e' lateral:   13.80 cm/s LVOT diam:     1.90 cm   LV E/e' lateral: 7.6 LV SV:         70 LV SV Index:   39 LVOT Area:     2.84 cm                           3D Volume EF:                          3D EF:        66 %                          LV EDV:       124 ml                          LV ESV:       42 ml                          LV SV:        82 ml RIGHT VENTRICLE             IVC RV S prime:     25.00 cm/s  IVC diam: 1.80 cm TAPSE (M-mode): 2.0 cm RVSP:           21.3 mmHg LEFT ATRIUM             Index        RIGHT ATRIUM           Index LA diam:        3.70 cm 2.06 cm/m   RA Pressure: 3.00 mmHg LA Vol (A2C):   46.7 ml 25.98 ml/m  RA Area:     11.40 cm LA Vol (A4C):   39.5 ml 21.98 ml/m  RA Volume:   22.70 ml  12.63 ml/m LA Biplane Vol: 45.9 ml 25.54 ml/m  AORTIC VALVE LVOT Vmax:   119.00 cm/s LVOT Vmean:  75.300 cm/s LVOT VTI:    0.247 m  AORTA Ao Root diam: 3.00 cm Ao Asc diam:  3.20 cm MITRAL VALVE                TRICUSPID VALVE MV Area (PHT): 3.89 cm     TR Peak grad:   18.3 mmHg MV Decel Time: 195 msec     TR Vmax:        214.00 cm/s MV E velocity: 105.00 cm/s  Estimated RAP:  3.00 mmHg MV A velocity: 93.80  cm/s   RVSP:           21.3 mmHg MV E/A ratio:  1.12                             SHUNTS                             Systemic VTI:  0.25 m                             Systemic Diam: 1.90 cm Dorris Carnes MD Electronically signed by Dorris Carnes MD Signature Date/Time: 06/02/2021/4:36:31 PM    Final

## 2021-06-19 NOTE — Assessment & Plan Note (Signed)
Suspect this is related to degenerative arthritis I reviewed the CT imaging with the patient which show mild degenerative changes The patient is reassured

## 2021-06-19 NOTE — Assessment & Plan Note (Signed)
Her blood pressure is elevated likely triggered by anxiety Observe closely We discussed the importance of dietary modification and exercise

## 2021-06-20 ENCOUNTER — Telehealth: Payer: Self-pay

## 2021-06-20 LAB — CA 125: Cancer Antigen (CA) 125: 6.3 U/mL (ref 0.0–38.1)

## 2021-06-20 NOTE — Telephone Encounter (Signed)
-----   Message from [name], MD sent at 06/20/2021  8:41 AM EST ----- Let her know CA-125 is ok

## 2021-06-20 NOTE — Telephone Encounter (Signed)
Called and given below message. She verbalized understanding. 

## 2021-06-23 ENCOUNTER — Encounter: Payer: Self-pay | Admitting: Gynecologic Oncology

## 2021-06-23 ENCOUNTER — Telehealth: Payer: Self-pay | Admitting: *Deleted

## 2021-06-23 ENCOUNTER — Inpatient Hospital Stay (HOSPITAL_BASED_OUTPATIENT_CLINIC_OR_DEPARTMENT_OTHER): Payer: BC Managed Care – PPO | Admitting: Gynecologic Oncology

## 2021-06-23 DIAGNOSIS — R102 Pelvic and perineal pain: Secondary | ICD-10-CM

## 2021-06-23 DIAGNOSIS — C561 Malignant neoplasm of right ovary: Secondary | ICD-10-CM | POA: Diagnosis not present

## 2021-06-23 NOTE — Progress Notes (Signed)
Gynecologic Oncology Telehealth Consult Note: Gyn-Onc  I connected with Brooke Khan on 06/23/21 at  4:00 PM EST by telephone and verified that I am speaking with the correct person using two identifiers.  I discussed the limitations, risks, security and privacy concerns of performing an evaluation and management service by telemedicine and the availability of in-person appointments. I also discussed with the patient that there may be a patient responsible charge related to this service. The patient expressed understanding and agreed to proceed.  Other persons participating in the visit and their role in the encounter: None.  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)   Initial Diagnosis   Ovarian cancer, right ([place])   11/08/2020 Tumor Marker   Patient's tumor was tested for the following markers: CA-125. Results of the tumor marker test revealed 62.7.   11/20/2020 Imaging   CT scan of abdomen and pelvis  1. There is a large, cystic mass of the right ovary with peripheral nodular soft tissue components measuring at least 19.8 x 13.3 x 19.8 cm. Findings are consistent with primary ovarian malignancy. 2. Minimal stranding of the omentum in the ventral abdomen and minimal thickening of the peritoneum in the right lower quadrant in the right paracolic gutter without evidence of discrete omental or peritoneal nodularity. Findings are modestly suspicious for early peritoneal metastatic disease. 3. No lymphadenopathy or other evidence of metastatic disease in the abdomen or pelvis. 4. Polyp or adherent gallstone near the gallbladder fundus, better assessed by prior ultrasound. 5. Large burden of stool throughout the colon and rectum.   Aortic  Atherosclerosis (ICD10-I70.0).   12/03/2020 Pathology Results   SURGICAL PATHOLOGY  CASE: [id number]  PATIENT: Brooke Khan  Surgical Pathology Report   Clinical History: Ovarian mass (crm)    FINAL MICROSCOPIC DIAGNOSIS:   A. UTERUS, CERVIX, BILATERAL FALLOPIAN TUBES AND OVARIES:  Ovary, right  -  Endometrioid carcinoma, FIGO grade 3  -  See oncology table and comment below   Ovary, left:  -  No carcinoma identified   Bilateral Fallopian tubes:  -  No carcinoma identified   Uterus:  -  No carcinoma identified   Cervix:  -  No carcinoma identified   B. LYMPH NODES, RIGHT PARAAORTIC, RESECTION:  -  No carcinoma identified in six lymph nodes (0/6)   C. LYMPH NODES, LEFT PARAAORTIC, RESECTION:  -  No carcinoma identified in five lymph nodes (0/5)   D. LYMPH NODES, RIGHT PELVIC, RESECTION:  -  No carcinoma identified in five lymph nodes (0/5)   E. LYMPH NODES, LEFT PELVIC, RESECTION:  -  No carcinoma identified in three lymph nodes (0/3)   F. OMENTUM, OMENTECTOMY:  -  No carcinoma identified   G. PERITONEUM, ANTERIOR PELVIC, BIOPSY:  -  No carcinoma identified   H. PERITONEUM, RIGHT PELVIC, BIOPSY:  -  No carcinoma identified   I. PERITONEUM, LEFT PELVIC, BIOPSY:  -  No carcinoma identified   J. PERITONEUM, POSTERIOR PELVIC, BIOPSY:  -  No carcinoma identified   K. PERITONEUM, RIGHT ABDOMINAL, BIOPSY:  -  No carcinoma identified   L. PERITONEUM, LEFT ABDOMINAL, BIOPSY:  -  No carcinoma identified    ONCOLOGY TABLE:   OVARY or FALLOPIAN TUBE or PRIMARY PERITONEUM: Resection   Procedure: Hysterectomy with bilateral  salpingo-oophorectomy  Specimen Integrity: Intact  Tumor Site: Ovary, right  Tumor Size: 1.5 cm  Histologic Type: Endometrioid carcinoma  Histologic Grade: FIGO grade 3  Ovarian Surface Involvement: Not identified  Fallopian Tube Surface Involvement: Not identified  Other Tissue/ Organ Involvement: Not applicable  Largest  Extrapelvic Peritoneal Focus: Not applicable  Peritoneal/Ascitic Fluid Involvement: Malignant cells not identified  Chemotherapy Response Score (CRS): Not applicable, no known presurgical therapy  Regional Lymph Nodes:       Number of Nodes with Metastasis Greater than 10 mm: 0       Number of Nodes with Metastasis 10 mm or Less (excludes isolated  tumor cells): 0       Number of Nodes with Isolated Tumor Cells (0.2 mm or less): 0       Number of Lymph Nodes Examined: 19  Distant Metastasis:       Distant Site(s) Involved: Not applicable  Pathologic Stage Classification (pTNM, AJCC 8th Edition): pT1a, pN0  Ancillary Studies: Can be performed upon request  Representative Tumor Block: A8  Comment(s): Dr. Vic Ripper reviewed the case and agrees with the above diagnosis.     12/03/2020 Surgery   Surgeon: Donaciano Eva    Operation: Robotic-assisted laparoscopic total hysterectomy with bilateral salpingoophorectomy, omentectomy, pelvic and para-aortic lymphadnectomy.     Surgeon: Donaciano Eva    Operative Findings:  : 20cm cystic mass, smooth. Attachments between inferior aspect of cystic mass and posterior pelvic peritoneum. Normal appearing omentum and nodes. Invasive malignancy on evaluation. No gross extra-ovarian disease. Clinical stage I disease. No gross residual disease.    12/11/2020 Cancer Staging   Staging form: Ovary, Fallopian Tube, and Primary Peritoneal Carcinoma, AJCC 8th Edition - Pathologic stage from 12/11/2020: FIGO Stage IA (pT1a, pN0, cM0) - Signed by [name], MD on 12/11/2020 Stage prefix: Initial diagnosis    01/10/2021 Genetic Testing   Negative germline and somatic genetic testing: no pathogenic variants detected in [employer] MyRisk Panel.   No somatic variants detected in [employer] [facility]Choice HRD testing.  Genomic instaiblity score is negative.  The report dates are January 10, 2021.    The [facility] gene panel offered by [employer] includes  sequencing and deletion/duplication testing of the following 48 genes: APC, ATM, AXIN2, BAP1, BARD1, BMPR1A, BRCA1, BRCA2, BRIP1, CHD1, CDK4, CDKN2A(p16 and p14ARF), CHEK2, CTNNA1, EGFR, EPCAM, FH, FLCN, GREM1, HOXB13, MEN1, MET, MITF , MLH1, MSH2, MSH3, MSH6, MUTYH, NTHL1, PALB2, PMS2, POLD1, POLE, PTEN, RAD51C, RAD51D, RET, SDHA, SDHB, SDHC, SDHD, SMAD4, STK11,TERT, TP53, TSC1, TSC2, and VHL.    01/15/2021 - 03/17/2021 Chemotherapy   Patient is on Treatment Plan : OVARIAN Carboplatin (AUC 6) / Paclitaxel (175) q21d x 6 cycles     04/15/2021 Tumor Marker   Patient's tumor was tested for the following markers: Ca-125. Results of the tumor marker test revealed 7.7.   04/16/2021 Imaging   1. New left periaortic adenopathy and new left omental and mesenteric root nodules, indicative of metastatic disease. 2. Right lower lobe nodules are stable. 3. Hepatic steatosis. 4. Cholelithiasis. 5. Left renal stone. 6.  Aortic atherosclerosis (ICD10-I70.0).   06/17/2021 Imaging   1. Small RIGHT retroperitoneal nodule not significant changed. Recommend attention on follow-up. 2. Two low-density para-aortic lymph nodes unchanged from comparison exam. 3. No clear evidence omental or peritoneal metastasis. 4. Stable small pulmonary nodule in the RIGHT lower lobe.     06/19/2021 Tumor Marker   Patient's tumor was tested for the following markers: CA-125. Results of the  tumor marker test revealed 6.3.     Interval History: Patient reports overall doing well.  Had CT scan last week.  Has had several days of pelvic pressure since her CT scan that were similar to symptoms she had in August last year when she was diagnosed with a urinary tract infection.    Past Medical/Surgical History: Past Medical History:  Diagnosis Date   Bradycardia    Chicken pox    Elevated blood pressure reading    Epigastric abdominal pain    Family history of breast cancer 12/26/2020   Family history of prostate cancer 12/26/2020    Heart murmur    since childhood- not always heard   Hyperglycemia    Hyperlipidemia    borderline- no meds   Hypothyroidism (acquired)    Lightheaded    ovarian ca 11/2020   Pre-diabetes    Vitamin D deficiency     Past Surgical History:  Procedure Laterality Date   CESAREAN SECTION     x2   cosmetic knee surgery     as child   LAPAROTOMY N/A 12/03/2020   Procedure: MINI LAPAROTOMY FOR CYST DECOMPRESSION;  Surgeon: Everitt Amber, MD;  Location: [facility];  Service: Gynecology;  Laterality: N/A;   ROBOTIC ASSISTED TOTAL HYSTERECTOMY WITH BILATERAL SALPINGO OOPHERECTOMY N/A 12/03/2020   Procedure: XI ROBOTIC ASSISTED TOTAL HYSTERECTOMY WITH BILATERAL SALPINGO OOPHORECTOMY WITH PARA-AORTIC AND PELVIC LYMPHADNECTOMY AND OMENTECTOMY;  Surgeon: Everitt Amber, MD;  Location: [facility];  Service: Gynecology;  Laterality: N/A;    Family History  Problem Relation Age of Onset   Elevated Lipids Mother    Prostate cancer Father        dx 95s   Elevated Lipids Father    Heart disease Father        mid 78s. had been remote smoker.    Hypertension Father    Diabetes Father        mid 20s   Dementia Father        vascular based   Skin cancer Father        SCC, dx after 75   Healthy Brother    Breast cancer Maternal Aunt 63   Prostate cancer Maternal Uncle        dx 45s   Lymphoma Paternal Uncle        dx after 69   Diabetes Maternal Grandmother    Arthritis Maternal Grandfather    Diabetes Paternal Grandfather    Cancer Cousin        unknown type; ?throat ?lymphoma d. 97   Colon cancer Neg Hx    Colon polyps Neg Hx    Esophageal cancer Neg Hx    Rectal cancer Neg Hx    Stomach cancer Neg Hx     Social History   Socioeconomic History   Marital status: Married    Spouse name: Not on file   Number of children: Not on file   Years of education: Not on file   Highest education level: Not on file  Occupational History   Not on file  Tobacco Use   Smoking status: Never    Smokeless tobacco: Never  Vaping Use   Vaping Use: Never used  Substance and Sexual Activity   Alcohol use: Yes    Alcohol/week: 0.0 standard drinks    Comment: occ; twice a month    Drug use: No   Sexual activity: Yes    Partners: Male    Comment: husband  Other Topics Concern  Not on file  Social History Narrative   Married 30 years October 2020. Daughter went to [place]. Son went to [place]. In 2020, daughter Corporate treasurer in [place], son moving to [place] and to work for [employer] in Engineer, mining. 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       Hobbies: tennis, plays mahjong in womens group      Social Determinants of Health   Financial Resource Strain: Not on file  Food Insecurity: Not on file  Transportation Needs: Not on file  Physical Activity: Not on file  Stress: Not on file  Social Connections: Not on file    Current Medications:  Current Outpatient Medications:    amLODipine (NORVASC) 5 MG tablet, Take 1 tablet (5 mg total) by mouth daily., Disp: 90 tablet, Rfl: 2   Calcium Carb-Cholecalciferol (CALCIUM 600+D3 PO), Take 2 tablets by mouth in the morning. (Patient not taking: Reported on 01/14/2021), Disp: , Rfl:    calcium carbonate (TUMS - DOSED IN MG ELEMENTAL CALCIUM) 500 MG chewable tablet, Chew 1 tablet by mouth daily., Disp: , Rfl:    Cholecalciferol (VITAMIN D) 125 MCG (5000 UT) CAPS, Take by mouth., Disp: , Rfl:    levothyroxine (SYNTHROID) 50 MCG tablet, TAKE 1 TABLET BY MOUTH EVERY DAY, Disp: 90 tablet, Rfl: 3   lidocaine-prilocaine (EMLA) cream, Apply 1 application topically as needed. (Patient not taking: Reported on 05/19/2021), Disp: 30 g, Rfl: 0   LORazepam (ATIVAN) 0.5 MG tablet, Take 1 pill 1 hour before CT, may repeat in 30 mins if no effect (Patient not taking: Reported on 05/19/2021), Disp: 2 tablet, Rfl: 0   rosuvastatin (CRESTOR) 5 MG tablet, Take 1 tablet (5 mg total) by mouth daily., Disp: 90 tablet, Rfl: 2   vitamin B-12  (CYANOCOBALAMIN) 1000 MCG tablet, Take 1,000 mcg by mouth in the morning., Disp: , Rfl:    vitamin C (ASCORBIC ACID) 500 MG tablet, Take 500 mg by mouth in the morning., Disp: , Rfl:    Vitamin D, Ergocalciferol, (DRISDOL) 1.25 MG (50000 UNIT) CAPS capsule, Take 1 capsule (50,000 Units total) by mouth every 7 (seven) days. (Patient not taking: Reported on 05/21/2021), Disp: 12 capsule, Rfl: 0  Review of Symptoms: Pertinent positives as per HPI.  Physical Exam: LMP 05/11/2014  Deferred given limitations of phone visit.  Laboratory & Radiologic Studies: Ct A/P on 06/16/21: IMPRESSION: 1. Small RIGHT retroperitoneal nodule not significant changed. Recommend attention on follow-up. 2. Two low-density para-aortic lymph nodes unchanged from comparison exam. 3. No clear evidence omental or peritoneal metastasis. 4. Stable small pulmonary nodule in the RIGHT lower lobe.  Component Ref Range & Units 4 d ago 2 mo ago  Cancer Antigen (CA) 125 0.0 - 38.1 U/mL 6.3  7.7 CM    Assessment & Plan: Brooke Khan is a 56 y.o. 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  Completed 4 cycles of adjuvant chemotherapy. Vaginal cuff biopsy performed on 05/19/21 showed granulation tissue.  Discussed with the patient CT findings.  Several areas of concern for possible postoperative changes versus adenopathy are stable in size, overall supporting benign etiology.  I do not think that a PET scan is indicated.  I would recommend follow-up imaging in 3-6 months.  Patient is out of town at the moment but began having some pelvic pressure symptoms after her CT scan that were similar to when she developed pressure  last August and was ultimately treated for urinary tract infection.  I have offered to order a urine culture and urinalysis.  We discussed that we could either try to arrange having this done in [place] while she is out of town.   The patient will be back on Friday.  I offered that she could also call on Thursday to let us know if and when she would like to drop off a sample here on Friday or Monday.  The latter was her preference.  I discussed the assessment and treatment plan with the patient. The patient was provided with an opportunity to ask questions and all were answered. The patient agreed with the plan and demonstrated an understanding of the instructions.   The patient was advised to call back or see an in-person evaluation if the symptoms worsen or if the condition fails to improve as anticipated.   18 minutes of total time was spent for this patient encounter, including preparation, face-to-face counseling with the patient and coordination of care, and documentation of the encounter.   Jeral Pinch, MD  Division of Gynecologic Oncology  Department of Obstetrics and Gynecology  [facility]

## 2021-06-23 NOTE — Telephone Encounter (Signed)
Per Dr Berline Lopes scheduled the patient to for a follow up appt on May 11th

## 2021-06-26 ENCOUNTER — Telehealth: Payer: Self-pay | Admitting: *Deleted

## 2021-06-26 NOTE — Telephone Encounter (Signed)
Patient called and scheduled a lab appt per Dr Berline Lopes conversation from 2/13. Patient's appt 2/17 at 3 pm

## 2021-06-27 ENCOUNTER — Encounter: Payer: Self-pay | Admitting: Gynecologic Oncology

## 2021-06-27 ENCOUNTER — Inpatient Hospital Stay: Payer: BC Managed Care – PPO

## 2021-06-27 ENCOUNTER — Other Ambulatory Visit: Payer: Self-pay

## 2021-06-27 DIAGNOSIS — Z79899 Other long term (current) drug therapy: Secondary | ICD-10-CM | POA: Diagnosis not present

## 2021-06-27 DIAGNOSIS — R102 Pelvic and perineal pain: Secondary | ICD-10-CM

## 2021-06-27 DIAGNOSIS — M549 Dorsalgia, unspecified: Secondary | ICD-10-CM | POA: Diagnosis not present

## 2021-06-27 DIAGNOSIS — F419 Anxiety disorder, unspecified: Secondary | ICD-10-CM | POA: Diagnosis not present

## 2021-06-27 DIAGNOSIS — C561 Malignant neoplasm of right ovary: Secondary | ICD-10-CM | POA: Diagnosis not present

## 2021-06-27 LAB — URINALYSIS, COMPLETE (UACMP) WITH MICROSCOPIC
Bilirubin Urine: NEGATIVE
Glucose, UA: NEGATIVE mg/dL
Hgb urine dipstick: NEGATIVE
Ketones, ur: NEGATIVE mg/dL
Nitrite: NEGATIVE
Protein, ur: NEGATIVE mg/dL
Specific Gravity, Urine: 1.02 (ref 1.005–1.030)
pH: 7.5 (ref 5.0–8.0)

## 2021-06-28 LAB — URINE CULTURE: Culture: NO GROWTH

## 2021-06-30 ENCOUNTER — Telehealth: Payer: Self-pay | Admitting: *Deleted

## 2021-06-30 ENCOUNTER — Other Ambulatory Visit: Payer: BC Managed Care – PPO | Admitting: *Deleted

## 2021-06-30 ENCOUNTER — Other Ambulatory Visit: Payer: Self-pay

## 2021-06-30 DIAGNOSIS — E785 Hyperlipidemia, unspecified: Secondary | ICD-10-CM

## 2021-06-30 DIAGNOSIS — Z79899 Other long term (current) drug therapy: Secondary | ICD-10-CM | POA: Diagnosis not present

## 2021-06-30 DIAGNOSIS — Z9221 Personal history of antineoplastic chemotherapy: Secondary | ICD-10-CM

## 2021-06-30 LAB — LIPID PANEL
Chol/HDL Ratio: 2.7 ratio (ref 0.0–4.4)
Cholesterol, Total: 122 mg/dL (ref 100–199)
HDL: 45 mg/dL (ref >39)
LDL Chol Calc (NIH): 60 mg/dL (ref 0–99)
Triglycerides: 85 mg/dL (ref 0–149)
VLDL Cholesterol Cal: 17 mg/dL (ref 5–40)

## 2021-06-30 NOTE — Telephone Encounter (Signed)
Spoke with Ms.Wanninger this afternoon regarding her urinalysis and pelvic pressure. She denies any increase in urine frequency, burning, pain, fever, or odor. She states that she had this same type of pelvic pressure after surgery and it went away in September but returned last week. Per Joylene John, NP: Take AZO OTC and we will follow up with her later on in the week.

## 2021-07-02 ENCOUNTER — Other Ambulatory Visit: Payer: BC Managed Care – PPO

## 2021-07-03 NOTE — Telephone Encounter (Signed)
Spoke with Brooke Khan this afternoon to follow up on her complaint of pelvis pressure. She verbalized that her pelvis pressure has improved a little since she has started taken the AZO OTC.  She stated the pressure is in her mid lower abdomen area and worsens when she sits and improves when she lays down but has improved since we last contacted pt. She will be on vacation until March 5th and will call when she gets back if it worsens. Informed pt to call us if symptoms persist, worsens or with any other questions of concerns. Pt verbalized understanding.

## 2021-07-21 DIAGNOSIS — Z6830 Body mass index (BMI) 30.0-30.9, adult: Secondary | ICD-10-CM | POA: Diagnosis not present

## 2021-07-21 DIAGNOSIS — Z01419 Encounter for gynecological examination (general) (routine) without abnormal findings: Secondary | ICD-10-CM | POA: Diagnosis not present

## 2021-09-12 ENCOUNTER — Encounter: Payer: Self-pay | Admitting: Gynecologic Oncology

## 2021-09-15 ENCOUNTER — Inpatient Hospital Stay: Payer: BC Managed Care – PPO | Attending: Gynecologic Oncology

## 2021-09-15 ENCOUNTER — Other Ambulatory Visit: Payer: Self-pay

## 2021-09-15 ENCOUNTER — Telehealth: Payer: Self-pay

## 2021-09-15 DIAGNOSIS — Z8543 Personal history of malignant neoplasm of ovary: Secondary | ICD-10-CM | POA: Diagnosis not present

## 2021-09-15 DIAGNOSIS — R011 Cardiac murmur, unspecified: Secondary | ICD-10-CM | POA: Diagnosis not present

## 2021-09-15 DIAGNOSIS — Z808 Family history of malignant neoplasm of other organs or systems: Secondary | ICD-10-CM | POA: Insufficient documentation

## 2021-09-15 DIAGNOSIS — R000 Tachycardia, unspecified: Secondary | ICD-10-CM | POA: Diagnosis not present

## 2021-09-15 DIAGNOSIS — E039 Hypothyroidism, unspecified: Secondary | ICD-10-CM | POA: Insufficient documentation

## 2021-09-15 DIAGNOSIS — Z8042 Family history of malignant neoplasm of prostate: Secondary | ICD-10-CM | POA: Diagnosis not present

## 2021-09-15 DIAGNOSIS — Z79899 Other long term (current) drug therapy: Secondary | ICD-10-CM | POA: Insufficient documentation

## 2021-09-15 DIAGNOSIS — E785 Hyperlipidemia, unspecified: Secondary | ICD-10-CM | POA: Insufficient documentation

## 2021-09-15 DIAGNOSIS — Z803 Family history of malignant neoplasm of breast: Secondary | ICD-10-CM | POA: Insufficient documentation

## 2021-09-15 DIAGNOSIS — C561 Malignant neoplasm of right ovary: Secondary | ICD-10-CM

## 2021-09-15 DIAGNOSIS — E559 Vitamin D deficiency, unspecified: Secondary | ICD-10-CM | POA: Diagnosis not present

## 2021-09-15 LAB — COMPREHENSIVE METABOLIC PANEL
ALT: 27 U/L (ref 0–44)
AST: 16 U/L (ref 15–41)
Albumin: 4.4 g/dL (ref 3.5–5.0)
Alkaline Phosphatase: 79 U/L (ref 38–126)
Anion gap: 5 (ref 5–15)
BUN: 16 mg/dL (ref 6–20)
CO2: 29 mmol/L (ref 22–32)
Calcium: 9.9 mg/dL (ref 8.9–10.3)
Chloride: 110 mmol/L (ref 98–111)
Creatinine, Ser: 0.88 mg/dL (ref 0.44–1.00)
GFR, Estimated: >60 mL/min (ref >60)
Glucose, Bld: 84 mg/dL (ref 70–99)
Potassium: 4.1 mmol/L (ref 3.5–5.1)
Sodium: 144 mmol/L (ref 135–145)
Total Bilirubin: 0.4 mg/dL (ref 0.3–1.2)
Total Protein: 7.6 g/dL (ref 6.5–8.1)

## 2021-09-15 LAB — CBC WITH DIFFERENTIAL/PLATELET
Abs Immature Granulocytes: 0.02 10*3/uL (ref 0.00–0.07)
Basophils Absolute: 0 10*3/uL (ref 0.0–0.1)
Basophils Relative: 1 %
Eosinophils Absolute: 0.2 10*3/uL (ref 0.0–0.5)
Eosinophils Relative: 3 %
HCT: 39.7 % (ref 36.0–46.0)
Hemoglobin: 13.3 g/dL (ref 12.0–15.0)
Immature Granulocytes: 0 %
Lymphocytes Relative: 37 %
Lymphs Abs: 2.1 10*3/uL (ref 0.7–4.0)
MCH: 30.8 pg (ref 26.0–34.0)
MCHC: 33.5 g/dL (ref 30.0–36.0)
MCV: 91.9 fL (ref 80.0–100.0)
Monocytes Absolute: 0.4 10*3/uL (ref 0.1–1.0)
Monocytes Relative: 6 %
Neutro Abs: 3 10*3/uL (ref 1.7–7.7)
Neutrophils Relative %: 53 %
Platelets: 298 10*3/uL (ref 150–400)
RBC: 4.32 MIL/uL (ref 3.87–5.11)
RDW: 12.9 % (ref 11.5–15.5)
WBC: 5.7 10*3/uL (ref 4.0–10.5)
nRBC: 0 % (ref 0.0–0.2)

## 2021-09-15 NOTE — Telephone Encounter (Signed)
Brooke Khan has an appointment with Dr. Berline Lopes on Thursday 5/11.  LVM for Brooke Khan to call back regarding a, Brooke Khan requested lab appointment. for a CA125 for today or tomorrow so results can be reviewed at her appointment with Dr. Berline Lopes. ?

## 2021-09-15 NOTE — Telephone Encounter (Signed)
Spoke with Brooke Khan this morning. Lab appointment scheduled for this afternoon 09/15/21 at 2:15 pm. Patient is in agreement of appointment date and time.  ?

## 2021-09-16 ENCOUNTER — Other Ambulatory Visit: Payer: BC Managed Care – PPO

## 2021-09-16 LAB — CA 125: Cancer Antigen (CA) 125: 6.3 U/mL (ref 0.0–38.1)

## 2021-09-18 ENCOUNTER — Inpatient Hospital Stay (HOSPITAL_BASED_OUTPATIENT_CLINIC_OR_DEPARTMENT_OTHER): Payer: BC Managed Care – PPO | Admitting: Gynecologic Oncology

## 2021-09-18 ENCOUNTER — Encounter: Payer: Self-pay | Admitting: Gynecologic Oncology

## 2021-09-18 ENCOUNTER — Other Ambulatory Visit: Payer: Self-pay

## 2021-09-18 VITALS — BP 148/58 | HR 66 | Temp 98.2°F | Resp 16 | Ht 62.0 in | Wt 177.4 lb

## 2021-09-18 DIAGNOSIS — Z8543 Personal history of malignant neoplasm of ovary: Secondary | ICD-10-CM

## 2021-09-18 DIAGNOSIS — Z803 Family history of malignant neoplasm of breast: Secondary | ICD-10-CM | POA: Diagnosis not present

## 2021-09-18 DIAGNOSIS — R011 Cardiac murmur, unspecified: Secondary | ICD-10-CM | POA: Diagnosis not present

## 2021-09-18 DIAGNOSIS — Z6832 Body mass index (BMI) 32.0-32.9, adult: Secondary | ICD-10-CM

## 2021-09-18 DIAGNOSIS — E039 Hypothyroidism, unspecified: Secondary | ICD-10-CM | POA: Diagnosis not present

## 2021-09-18 DIAGNOSIS — C561 Malignant neoplasm of right ovary: Secondary | ICD-10-CM

## 2021-09-18 DIAGNOSIS — R000 Tachycardia, unspecified: Secondary | ICD-10-CM | POA: Diagnosis not present

## 2021-09-18 DIAGNOSIS — Z808 Family history of malignant neoplasm of other organs or systems: Secondary | ICD-10-CM | POA: Diagnosis not present

## 2021-09-18 DIAGNOSIS — Z79899 Other long term (current) drug therapy: Secondary | ICD-10-CM | POA: Diagnosis not present

## 2021-09-18 DIAGNOSIS — E559 Vitamin D deficiency, unspecified: Secondary | ICD-10-CM | POA: Diagnosis not present

## 2021-09-18 DIAGNOSIS — Z8042 Family history of malignant neoplasm of prostate: Secondary | ICD-10-CM | POA: Diagnosis not present

## 2021-09-18 DIAGNOSIS — E785 Hyperlipidemia, unspecified: Secondary | ICD-10-CM | POA: Diagnosis not present

## 2021-09-18 NOTE — Progress Notes (Signed)
Gynecologic Oncology Return Clinic Visit ? ?09/18/2021 ? ?Reason for Visit: Surveillance visit in the setting of history of ovarian cancer ? ?Treatment History: ?Oncology History Overview Note  ?Endometrioid cancer FIGO grade 3 ?Neg genetics ?ER 90% PR 90% ?  ?Ovarian cancer, right ([place])  ? Initial Diagnosis  ? Ovarian cancer, right [facility]) ?  ?11/08/2020 Tumor Marker  ? Patient's tumor was tested for the following markers: CA-125. ?Results of the tumor marker test revealed 62.7. ?  ?11/20/2020 Imaging  ? CT scan of abdomen and pelvis ? ?1. There is a large, cystic mass of the right ovary with peripheral nodular soft tissue components measuring at least 19.8 x 13.3 x 19.8 cm. Findings are consistent with primary ovarian malignancy. ?2. Minimal stranding of the omentum in the ventral abdomen and minimal thickening of the peritoneum in the right lower quadrant in the right paracolic gutter without evidence of discrete omental or peritoneal nodularity. Findings are modestly suspicious for early peritoneal metastatic disease. ?3. No lymphadenopathy or other evidence of metastatic disease in the abdomen or pelvis. ?4. Polyp or adherent gallstone near the gallbladder fundus, better assessed by prior ultrasound. ?5. Large burden of stool throughout the colon and rectum. ?  ?Aortic Atherosclerosis (ICD10-I70.0). ?  ?12/03/2020 Pathology Results  ? SURGICAL PATHOLOGY  ?CASE: [id number]  ?PATIENT: Brooke Khan  ?Surgical Pathology Report  ? ?Clinical History: Ovarian mass (crm)  ? ? ?FINAL MICROSCOPIC DIAGNOSIS:  ? ?A. UTERUS, CERVIX, BILATERAL FALLOPIAN TUBES AND OVARIES:  ?Ovary, right  ?-  Endometrioid carcinoma, FIGO grade 3  ?-  See oncology table and comment below  ? ?Ovary, left:  ?-  No carcinoma identified  ? ?Bilateral Fallopian tubes:  ?-  No carcinoma identified  ? ?Uterus:  ?-  No carcinoma identified  ? ?Cervix:  ?-  No carcinoma identified  ? ?B. LYMPH NODES, RIGHT PARAAORTIC, RESECTION:  ?-  No carcinoma  identified in six lymph nodes (0/6)  ? ?C. LYMPH NODES, LEFT PARAAORTIC, RESECTION:  ?-  No carcinoma identified in five lymph nodes (0/5)  ? ?D. LYMPH NODES, RIGHT PELVIC, RESECTION:  ?-  No carcinoma identified in five lymph nodes (0/5)  ? ?E. LYMPH NODES, LEFT PELVIC, RESECTION:  ?-  No carcinoma identified in three lymph nodes (0/3)  ? ?F. OMENTUM, OMENTECTOMY:  ?-  No carcinoma identified  ? ?G. PERITONEUM, ANTERIOR PELVIC, BIOPSY:  ?-  No carcinoma identified  ? ?H. PERITONEUM, RIGHT PELVIC, BIOPSY:  ?-  No carcinoma identified  ? ?I. PERITONEUM, LEFT PELVIC, BIOPSY:  ?-  No carcinoma identified  ? ?J. PERITONEUM, POSTERIOR PELVIC, BIOPSY:  ?-  No carcinoma identified  ? ?K. PERITONEUM, RIGHT ABDOMINAL, BIOPSY:  ?-  No carcinoma identified  ? ?L. PERITONEUM, LEFT ABDOMINAL, BIOPSY:  ?-  No carcinoma identified  ? ? ?ONCOLOGY TABLE:  ? ?OVARY or FALLOPIAN TUBE or PRIMARY PERITONEUM: Resection  ? ?Procedure: Hysterectomy with bilateral salpingo-oophorectomy  ?Specimen Integrity: Intact  ?Tumor Site: Ovary, right  ?Tumor Size: 1.5 cm  ?Histologic Type: Endometrioid carcinoma  ?Histologic Grade: FIGO grade 3  ?Ovarian Surface Involvement: Not identified  ?Fallopian Tube Surface Involvement: Not identified  ?Other Tissue/ Organ Involvement: Not applicable  ?Largest Extrapelvic Peritoneal Focus: Not applicable  ?Peritoneal/Ascitic Fluid Involvement: Malignant cells not identified  ?Chemotherapy Response Score (CRS): Not applicable, no known presurgical therapy  ?Regional Lymph Nodes:  ?     Number of Nodes with Metastasis Greater than 10 mm: 0  ?     Number  of Nodes with Metastasis 10 mm or Less (excludes isolated  ?tumor cells): 0  ?     Number of Nodes with Isolated Tumor Cells (0.2 mm or less): 0  ?     Number of Lymph Nodes Examined: 19  ?Distant Metastasis:  ?     Distant Site(s) Involved: Not applicable  ?Pathologic Stage Classification (pTNM, AJCC 8th Edition): pT1a, pN0  ?Ancillary Studies: Can be performed  upon request  ?Representative Tumor Block: A8  ?Comment(s): Dr. Vic Ripper reviewed the case and agrees with the above diagnosis.  ? ?  ?12/03/2020 Surgery  ? Surgeon: Donaciano Eva ?   ?Operation: Robotic-assisted laparoscopic total hysterectomy with bilateral salpingoophorectomy, omentectomy, pelvic and para-aortic lymphadnectomy.   ?  ?Surgeon: Donaciano Eva ?   ?Operative Findings:  : 20cm cystic mass, smooth. Attachments between inferior aspect of cystic mass and posterior pelvic peritoneum. Normal appearing omentum and nodes. Invasive malignancy on evaluation. ?No gross extra-ovarian disease. ?Clinical stage I disease. ?No gross residual disease.  ?  ?12/11/2020 Cancer Staging  ? Staging form: Ovary, Fallopian Tube, and Primary Peritoneal Carcinoma, AJCC 8th Edition ?- Pathologic stage from 12/11/2020: FIGO Stage IA (pT1a, pN0, cM0) - Signed by [name], MD on 12/11/2020 ?Stage prefix: Initial diagnosis ? ?  ?01/10/2021 Genetic Testing  ? Negative germline and somatic genetic testing: no pathogenic variants detected in [facility] Panel.   No somatic variants detected in [facility] HRD testing.  Genomic instaiblity score is negative.  The report dates are January 10, 2021.   ? ?The [facility] gene panel offered by [employer] includes sequencing and deletion/duplication testing of the following 48 genes: APC, ATM, AXIN2, BAP1, BARD1, BMPR1A, BRCA1, BRCA2, BRIP1, CHD1, CDK4, CDKN2A(p16 and p14ARF), CHEK2, CTNNA1, EGFR, EPCAM, FH, FLCN, GREM1, HOXB13, MEN1, MET, MITF , MLH1, MSH2, MSH3, MSH6, MUTYH, NTHL1, PALB2, PMS2, POLD1, POLE, PTEN, RAD51C, RAD51D, RET, SDHA, SDHB, SDHC, SDHD, SMAD4, STK11,TERT, TP53, TSC1, TSC2, and VHL.  ?  ?01/15/2021 - 03/17/2021 Chemotherapy  ? Patient is on Treatment Plan : OVARIAN Carboplatin (AUC 6) / Paclitaxel (175) q21d x 6 cycles  ? ?   ?04/15/2021 Tumor Marker  ? Patient's tumor was tested for the following markers: Ca-125. ?Results of the tumor marker  test revealed 7.7. ?  ?04/16/2021 Imaging  ? 1. New left periaortic adenopathy and new left omental and mesenteric root nodules, indicative of metastatic disease. ?2. Right lower lobe nodules are stable. ?3. Hepatic steatosis. ?4. Cholelithiasis. ?5. Left renal stone. ?6.  Aortic atherosclerosis (ICD10-I70.0). ?  ?06/17/2021 Imaging  ? 1. Small RIGHT retroperitoneal nodule not significant changed. ?Recommend attention on follow-up. ?2. Two low-density para-aortic lymph nodes unchanged from comparison exam. ?3. No clear evidence omental or peritoneal metastasis. ?4. Stable small pulmonary nodule in the RIGHT lower lobe. ?  ?  ?06/19/2021 Tumor Marker  ? Patient's tumor was tested for the following markers: CA-125. ?Results of the tumor marker test revealed 6.3. ?  ? ? ?Interval History: ?Patient presents today for surveillance visit.  She notes overall doing very well.  Endorses a good appetite without nausea or emesis.  Reports normal bowel and bladder function.  Denies any abdominal or pelvic pain.  Continues to have some mild neuropathy that affects 3 toes in her left foot, seems only to happen during colder weather. ? ?Is continuing to try to work on weight loss, walking and playing tennis and pickleball.  Is following a Mediterranean diet.  Somewhat discouraged as she has not  been able to lose weight in the last 5 months. ? ?Past Medical/Surgical History: ?Past Medical History:  ?Diagnosis Date  ? Bradycardia   ? Chicken pox   ? Elevated blood pressure reading   ? Epigastric abdominal pain   ? Family history of breast cancer 12/26/2020  ? Family history of prostate cancer 12/26/2020  ? Heart murmur   ? since childhood- not always heard  ? Hyperglycemia   ? Hyperlipidemia   ? borderline- no meds  ? Hypothyroidism (acquired)   ? Lightheaded   ? ovarian ca 11/2020  ? Pre-diabetes   ? Vitamin D deficiency   ? ? ?Past Surgical History:  ?Procedure Laterality Date  ? CESAREAN SECTION    ? x2  ? cosmetic knee surgery    ?  as child  ? LAPAROTOMY N/A 12/03/2020  ? Procedure: MINI LAPAROTOMY FOR CYST DECOMPRESSION;  Surgeon: Everitt Amber, MD;  Location: [facility];  Service: Gynecology;  Laterality: N/A;  ? ROBOTIC ASSISTED TOTAL HYSTEREC

## 2021-09-18 NOTE — Patient Instructions (Addendum)
It was good to see you today.  I do not see or feel any evidence of cancer recurrence on your exam.  You are scheduled to see Dr. Alvy Bimler in August.  I will see you back in November. ? ?My schedule is not out past the fall.  Please call back in September or October to get your visit for November scheduled. ? ?If you develop any concerning symptoms before then, please call to see me sooner. ?

## 2021-10-23 ENCOUNTER — Telehealth: Payer: Self-pay

## 2021-10-23 NOTE — Telephone Encounter (Signed)
Called and moved lab appt to 3 pm on 8/7. She verbalized understanding.

## 2021-10-23 NOTE — Telephone Encounter (Signed)
-----   Message from [name], MD sent at 10/23/2021  9:12 AM EDT ----- She is supposed to get lab first before CT Otherwise she can also come in and get lab the day before CT

## 2021-11-07 ENCOUNTER — Telehealth: Payer: Self-pay

## 2021-11-07 ENCOUNTER — Encounter: Payer: Self-pay | Admitting: Hematology and Oncology

## 2021-11-07 NOTE — Telephone Encounter (Signed)
Called her back and told her Dr. Alvy Bimler does not have any appts. She has been off treatment for a long time. She will call the office back if she needs appt with [facility]. She will try tylenol and ice to the area on her hand. She said that if it gets worse she may go to urgent care.

## 2021-11-07 NOTE — Telephone Encounter (Signed)
I have no way to see her She can go to [facility] if she wants it evaluated She has not been on treatment for a long time

## 2021-11-07 NOTE — Telephone Encounter (Signed)
Called her regarding mychart message. She is going to send a picture but does not think that you will be able to tell anything from the picture.  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 She thought it was warm to touch but she now thinks that it is not warm to touch. Denies injury and and redness. She said that it hurts when she presses on the spot.  She is concerned for the weekend and holiday.

## 2021-11-19 ENCOUNTER — Encounter: Payer: Self-pay | Admitting: Hematology and Oncology

## 2021-12-02 ENCOUNTER — Encounter: Payer: Self-pay | Admitting: Gastroenterology

## 2021-12-10 ENCOUNTER — Telehealth: Payer: Self-pay | Admitting: *Deleted

## 2021-12-10 ENCOUNTER — Other Ambulatory Visit: Payer: Self-pay

## 2021-12-10 NOTE — Telephone Encounter (Signed)
Contacted patient in response to earlier [employer]. In Dr. Calton Dach absence, patient's message/question was reviewed by Dr. Burr Medico.   Per Dr. Burr Medico, informed patient no high concern for bowel obstruction and that she can keep her appointments next week as scheduled. Informed her that CT scan will tell if she has bowel obstruction or not.   She said she is boated and feels like she may have gas or is just not having complete bms. Patient drinking SmoothMove Tea every evening - it contains Senna as ingredient. She is also drinking prune juice every morning. Explained to her that they both have laxative effect and this may be reason she is not having solid bms. Advised her she could continue taking those or stop one or the other and substitute Miralax daily as it pulls water into the intestines, but is not a stimulant.  Patient expressed appreciation for call. States she will keep appts next week and said she will consider Miralax. Encouraged her to contact office for further concerns or questions.

## 2021-12-15 ENCOUNTER — Inpatient Hospital Stay: Payer: BC Managed Care – PPO | Attending: Gynecologic Oncology

## 2021-12-15 DIAGNOSIS — K429 Umbilical hernia without obstruction or gangrene: Secondary | ICD-10-CM | POA: Diagnosis not present

## 2021-12-15 DIAGNOSIS — R911 Solitary pulmonary nodule: Secondary | ICD-10-CM | POA: Insufficient documentation

## 2021-12-15 DIAGNOSIS — N200 Calculus of kidney: Secondary | ICD-10-CM | POA: Diagnosis not present

## 2021-12-15 DIAGNOSIS — Z79899 Other long term (current) drug therapy: Secondary | ICD-10-CM | POA: Diagnosis not present

## 2021-12-15 DIAGNOSIS — K5900 Constipation, unspecified: Secondary | ICD-10-CM | POA: Diagnosis not present

## 2021-12-15 DIAGNOSIS — Z79890 Hormone replacement therapy (postmenopausal): Secondary | ICD-10-CM | POA: Insufficient documentation

## 2021-12-15 DIAGNOSIS — I700 Atherosclerosis of aorta: Secondary | ICD-10-CM | POA: Diagnosis not present

## 2021-12-15 DIAGNOSIS — R140 Abdominal distension (gaseous): Secondary | ICD-10-CM | POA: Diagnosis not present

## 2021-12-15 DIAGNOSIS — Z8543 Personal history of malignant neoplasm of ovary: Secondary | ICD-10-CM | POA: Insufficient documentation

## 2021-12-15 DIAGNOSIS — M419 Scoliosis, unspecified: Secondary | ICD-10-CM | POA: Diagnosis not present

## 2021-12-15 DIAGNOSIS — K5909 Other constipation: Secondary | ICD-10-CM | POA: Insufficient documentation

## 2021-12-15 DIAGNOSIS — C561 Malignant neoplasm of right ovary: Secondary | ICD-10-CM

## 2021-12-15 LAB — CBC WITH DIFFERENTIAL/PLATELET
Abs Immature Granulocytes: 0.01 10*3/uL (ref 0.00–0.07)
Basophils Absolute: 0 10*3/uL (ref 0.0–0.1)
Basophils Relative: 1 %
Eosinophils Absolute: 0.1 10*3/uL (ref 0.0–0.5)
Eosinophils Relative: 2 %
HCT: 41.3 % (ref 36.0–46.0)
Hemoglobin: 13.9 g/dL (ref 12.0–15.0)
Immature Granulocytes: 0 %
Lymphocytes Relative: 30 %
Lymphs Abs: 2 10*3/uL (ref 0.7–4.0)
MCH: 30.8 pg (ref 26.0–34.0)
MCHC: 33.7 g/dL (ref 30.0–36.0)
MCV: 91.4 fL (ref 80.0–100.0)
Monocytes Absolute: 0.5 10*3/uL (ref 0.1–1.0)
Monocytes Relative: 8 %
Neutro Abs: 3.9 10*3/uL (ref 1.7–7.7)
Neutrophils Relative %: 59 %
Platelets: 255 10*3/uL (ref 150–400)
RBC: 4.52 MIL/uL (ref 3.87–5.11)
RDW: 13.2 % (ref 11.5–15.5)
WBC: 6.6 10*3/uL (ref 4.0–10.5)
nRBC: 0 % (ref 0.0–0.2)

## 2021-12-15 LAB — COMPREHENSIVE METABOLIC PANEL
ALT: 26 U/L (ref 0–44)
AST: 16 U/L (ref 15–41)
Albumin: 4.6 g/dL (ref 3.5–5.0)
Alkaline Phosphatase: 73 U/L (ref 38–126)
Anion gap: 6 (ref 5–15)
BUN: 23 mg/dL — ABNORMAL HIGH (ref 6–20)
CO2: 28 mmol/L (ref 22–32)
Calcium: 9.9 mg/dL (ref 8.9–10.3)
Chloride: 108 mmol/L (ref 98–111)
Creatinine, Ser: 0.77 mg/dL (ref 0.44–1.00)
GFR, Estimated: >60 mL/min (ref >60)
Glucose, Bld: 87 mg/dL (ref 70–99)
Potassium: 4.1 mmol/L (ref 3.5–5.1)
Sodium: 142 mmol/L (ref 135–145)
Total Bilirubin: 0.3 mg/dL (ref 0.3–1.2)
Total Protein: 7.6 g/dL (ref 6.5–8.1)

## 2021-12-16 ENCOUNTER — Ambulatory Visit (HOSPITAL_COMMUNITY)
Admission: RE | Admit: 2021-12-16 | Discharge: 2021-12-16 | Disposition: A | Discharge status: Home / Self Care | Payer: BC Managed Care – PPO | Source: Ambulatory Visit | Attending: Hematology and Oncology | Admitting: Hematology and Oncology

## 2021-12-16 ENCOUNTER — Other Ambulatory Visit: Payer: BC Managed Care – PPO

## 2021-12-16 DIAGNOSIS — C569 Malignant neoplasm of unspecified ovary: Secondary | ICD-10-CM | POA: Diagnosis not present

## 2021-12-16 DIAGNOSIS — C561 Malignant neoplasm of right ovary: Secondary | ICD-10-CM | POA: Insufficient documentation

## 2021-12-16 DIAGNOSIS — N200 Calculus of kidney: Secondary | ICD-10-CM | POA: Diagnosis not present

## 2021-12-16 MED ORDER — IOHEXOL 300 MG/ML  SOLN
100.0000 mL | Freq: Once | INTRAMUSCULAR | Status: AC | PRN
  Administered 2021-12-16: 100 mL via INTRAVENOUS

## 2021-12-16 MED ORDER — SODIUM CHLORIDE (PF) 0.9 % IJ SOLN
INTRAMUSCULAR | Status: AC
  Filled 2021-12-16: qty 50

## 2021-12-17 LAB — CA 125: Cancer Antigen (CA) 125: 6.8 U/mL (ref 0.0–38.1)

## 2021-12-18 ENCOUNTER — Encounter: Payer: Self-pay | Admitting: Hematology and Oncology

## 2021-12-18 ENCOUNTER — Telehealth: Payer: Self-pay | Admitting: *Deleted

## 2021-12-18 ENCOUNTER — Other Ambulatory Visit: Payer: Self-pay

## 2021-12-18 ENCOUNTER — Inpatient Hospital Stay: Payer: BC Managed Care – PPO | Admitting: Hematology and Oncology

## 2021-12-18 DIAGNOSIS — I700 Atherosclerosis of aorta: Secondary | ICD-10-CM | POA: Diagnosis not present

## 2021-12-18 DIAGNOSIS — Z79899 Other long term (current) drug therapy: Secondary | ICD-10-CM | POA: Diagnosis not present

## 2021-12-18 DIAGNOSIS — K5900 Constipation, unspecified: Secondary | ICD-10-CM | POA: Diagnosis not present

## 2021-12-18 DIAGNOSIS — K5909 Other constipation: Secondary | ICD-10-CM

## 2021-12-18 DIAGNOSIS — K429 Umbilical hernia without obstruction or gangrene: Secondary | ICD-10-CM | POA: Diagnosis not present

## 2021-12-18 DIAGNOSIS — Z79890 Hormone replacement therapy (postmenopausal): Secondary | ICD-10-CM | POA: Diagnosis not present

## 2021-12-18 DIAGNOSIS — R911 Solitary pulmonary nodule: Secondary | ICD-10-CM | POA: Diagnosis not present

## 2021-12-18 DIAGNOSIS — M419 Scoliosis, unspecified: Secondary | ICD-10-CM | POA: Diagnosis not present

## 2021-12-18 DIAGNOSIS — N200 Calculus of kidney: Secondary | ICD-10-CM | POA: Diagnosis not present

## 2021-12-18 DIAGNOSIS — M546 Pain in thoracic spine: Secondary | ICD-10-CM | POA: Diagnosis not present

## 2021-12-18 DIAGNOSIS — C561 Malignant neoplasm of right ovary: Secondary | ICD-10-CM

## 2021-12-18 DIAGNOSIS — R140 Abdominal distension (gaseous): Secondary | ICD-10-CM | POA: Diagnosis not present

## 2021-12-18 DIAGNOSIS — Z8543 Personal history of malignant neoplasm of ovary: Secondary | ICD-10-CM | POA: Diagnosis not present

## 2021-12-18 NOTE — Assessment & Plan Note (Signed)
The patient have new onset of constipation recently She also have significant sensation of bloating On CT imaging, there is evidence of constipation and presence of large amount of gas We discussed dietary modification, increase oral fluid intake, short-term daily MiraLAX use and to change to calcium supplement to a different brand I also recommend she proceed with colonoscopy when she see her gastroenterologist in 2 weeks due to personal history of polyps and strong family history of polyps

## 2021-12-18 NOTE — Telephone Encounter (Signed)
Patient here today to see Dr Christiana Pellant, Patient scheduled for a lab and MD visit for Dr Berline Lopes on 11/10

## 2021-12-18 NOTE — Progress Notes (Signed)
[place] OFFICE PROGRESS NOTE  Patient Care Team: [name], MD as PCP - General (Family Medicine)  ASSESSMENT & PLAN:  Ovarian cancer, right [facility]) I have reviewed multiple imaging studies with the patient and her husband We reviewed results of her tumor marker She has no evidence of disease recurrence I plan to see her again in 6 months we have repeat tumor marker She will see GYN surgeon in 3 months for further follow-up  Other constipation The patient have new onset of constipation recently She also have significant sensation of bloating On CT imaging, there is evidence of constipation and presence of large amount of gas We discussed dietary modification, increase oral fluid intake, short-term daily MiraLAX use and to change to calcium supplement to a different brand I also recommend she proceed with colonoscopy when she see her gastroenterologist in 2 weeks due to personal history of polyps and strong family history of polyps  Acute back pain She has been exercising on a regular basis and had strained her back recently I reviewed imaging study which show she has mild scoliosis She is on vitamin D supplement I recommend conservative approach with massage and to change to a different kind of exercise in the short-term  No orders of the defined types were placed in this encounter.   All questions were answered. The patient knows to call the clinic with any problems, questions or concerns. The total time spent in the appointment was 30 minutes encounter with patients including review of chart and various tests results, discussions about plan of care and coordination of care plan   [name], MD 12/18/2021 9:49 AM  INTERVAL HISTORY: Please see below for problem oriented charting. she returns for surveillance follow-up and review results with her husband She had recent onset of constipation, bloating and new back pain She has been exercising  recently She denies any significant dietary change She may not be drinking enough liquid She had good bowel movement on the day of her CT imaging She have strong family history of polyps and personal history of polyps.  She has appointment pending to see gastroenterologist in 2 weeks  REVIEW OF SYSTEMS:   Constitutional: Denies fevers, chills or abnormal weight loss Eyes: Denies blurriness of vision Ears, nose, mouth, throat, and face: Denies mucositis or sore throat Respiratory: Denies cough, dyspnea or wheezes Cardiovascular: Denies palpitation, chest discomfort or lower extremity swelling Skin: Denies abnormal skin rashes Lymphatics: Denies new lymphadenopathy or easy bruising Neurological:Denies numbness, tingling or new weaknesses Behavioral/Psych: Mood is stable, no new changes  All other systems were reviewed with the patient and are negative.  I have reviewed the past medical history, past surgical history, social history and family history with the patient and they are unchanged from previous note.  ALLERGIES:  is allergic to codeine.  MEDICATIONS:  Current Outpatient Medications  Medication Sig Dispense Refill   polyethylene glycol (MIRALAX / GLYCOLAX) 17 g packet Take 17 g by mouth daily.     amLODipine (NORVASC) 5 MG tablet Take 1 tablet (5 mg total) by mouth daily. 90 tablet 2   calcium carbonate (TUMS - DOSED IN MG ELEMENTAL CALCIUM) 500 MG chewable tablet Chew 1 tablet by mouth daily.     Cholecalciferol (VITAMIN D3) 1.25 MG (50000 UT) CAPS Vitamin D3  5,000 iu qd     levothyroxine (SYNTHROID) 50 MCG tablet TAKE 1 TABLET BY MOUTH EVERY DAY 90 tablet 3   rosuvastatin (CRESTOR) 5 MG tablet Take  1 tablet (5 mg total) by mouth daily. 90 tablet 2   vitamin B-12 (CYANOCOBALAMIN) 1000 MCG tablet Take 1,000 mcg by mouth in the morning.     vitamin C (ASCORBIC ACID) 500 MG tablet Take 500 mg by mouth in the morning.     No current facility-administered medications for this  visit.    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 Results of the tumor marker test revealed 62.7.   11/20/2020 Imaging   CT scan of abdomen and pelvis  1. There is a large, cystic mass of the right ovary with peripheral nodular soft tissue components measuring at least 19.8 x 13.3 x 19.8 cm. Findings are consistent with primary ovarian malignancy. 2. Minimal stranding of the omentum in the ventral abdomen and minimal thickening of the peritoneum in the right lower quadrant in the right paracolic gutter without evidence of discrete omental or peritoneal nodularity. Findings are modestly suspicious for early peritoneal metastatic disease. 3. No lymphadenopathy or other evidence of metastatic disease in the abdomen or pelvis. 4. Polyp or adherent gallstone near the gallbladder fundus, better assessed by prior ultrasound. 5. Large burden of stool throughout the colon and rectum.   Aortic Atherosclerosis (ICD10-I70.0).   12/03/2020 Pathology Results   SURGICAL PATHOLOGY  CASE: [id number]  PATIENT: Brooke Khan  Surgical Pathology Report   Clinical History: Ovarian mass (c[facility])    FINAL MICROSCOPIC DIAGNOSIS:   A. UTERUS, CERVIX, BILATERAL FALLOPIAN TUBES AND OVARIES:  Ovary, right  -  Endometrioid carcinoma, FIGO grade 3  -  See oncology table and comment below   Ovary, left:  -  No carcinoma identified   Bilateral Fallopian tubes:  -  No carcinoma identified   Uterus:  -  No carcinoma identified   Cervix:  -  No carcinoma identified   B. LYMPH NODES, RIGHT PARAAORTIC, RESECTION:  -  No carcinoma identified in six lymph nodes (0/6)   C. LYMPH NODES, LEFT PARAAORTIC, RESECTION:  -  No carcinoma identified in five  lymph nodes (0/5)   D. LYMPH NODES, RIGHT PELVIC, RESECTION:  -  No carcinoma identified in five lymph nodes (0/5)   E. LYMPH NODES, LEFT PELVIC, RESECTION:  -  No carcinoma identified in three lymph nodes (0/3)   F. OMENTUM, OMENTECTOMY:  -  No carcinoma identified   G. PERITONEUM, ANTERIOR PELVIC, BIOPSY:  -  No carcinoma identified   H. PERITONEUM, RIGHT PELVIC, BIOPSY:  -  No carcinoma identified   I. PERITONEUM, LEFT PELVIC, BIOPSY:  -  No carcinoma identified   J. PERITONEUM, POSTERIOR PELVIC, BIOPSY:  -  No carcinoma identified   K. PERITONEUM, RIGHT ABDOMINAL, BIOPSY:  -  No carcinoma identified   L. PERITONEUM, LEFT ABDOMINAL, BIOPSY:  -  No carcinoma identified    ONCOLOGY TABLE:   OVARY or FALLOPIAN TUBE or PRIMARY PERITONEUM: Resection   Procedure: Hysterectomy with bilateral salpingo-oophorectomy  Specimen Integrity: Intact  Tumor Site: Ovary, right  Tumor Size: 1.5 cm  Histologic Type: Endometrioid carcinoma  Histologic Grade: FIGO grade 3  Ovarian Surface Involvement: Not identified  Fallopian Tube Surface Involvement: Not identified  Other Tissue/ Organ Involvement: Not applicable  Largest Extrapelvic Peritoneal Focus: Not applicable  Peritoneal/Ascitic Fluid Involvement: Malignant  cells not identified  Chemotherapy Response Score (CRS): Not applicable, no known presurgical therapy  Regional Lymph Nodes:       Number of Nodes with Metastasis Greater than 10 mm: 0       Number of Nodes with Metastasis 10 mm or Less (excludes isolated  tumor cells): 0       Number of Nodes with Isolated Tumor Cells (0.2 mm or less): 0       Number of Lymph Nodes Examined: 19  Distant Metastasis:       Distant Site(s) Involved: Not applicable  Pathologic Stage Classification (pTNM, AJCC 8th Edition): pT1a, pN0  Ancillary Studies: Can be performed upon request  Representative Tumor Block: A8  Comment(s): Dr. Vic Ripper reviewed the case and agrees with the above  diagnosis.     12/03/2020 Surgery   Surgeon: Donaciano Eva    Operation: Robotic-assisted laparoscopic total hysterectomy with bilateral salpingoophorectomy, omentectomy, pelvic and para-aortic lymphadnectomy.     Surgeon: Donaciano Eva    Operative Findings:  : 20cm cystic mass, smooth. Attachments between inferior aspect of cystic mass and posterior pelvic peritoneum. Normal appearing omentum and nodes. Invasive malignancy on evaluation. No gross extra-ovarian disease. Clinical stage I disease. No gross residual disease.    12/11/2020 Cancer Staging   Staging form: Ovary, Fallopian Tube, and Primary Peritoneal Carcinoma, AJCC 8th Edition - Pathologic stage from 12/11/2020: FIGO Stage IA (pT1a, pN0, cM0) - Signed by [name], MD on 12/11/2020 Stage prefix: Initial diagnosis   01/10/2021 Genetic Testing   Negative germline and somatic genetic testing: no pathogenic variants detected in [employer] My[facility] Panel.   No somatic variants detected in [employer] [facility] HRD testing.  Genomic instaiblity score is negative.  The report dates are January 10, 2021.    The [facility] gene panel offered by [employer] includes sequencing and deletion/duplication testing of the following 48 genes: APC, ATM, AXIN2, BAP1, BARD1, BMPR1A, BRCA1, BRCA2, BRIP1, CHD1, CDK4, CDKN2A(p16 and p14ARF), CHEK2, CTNNA1, EGFR, EPCAM, FH, FLCN, GREM1, HOXB13, MEN1, MET, MITF , MLH1, MSH2, MSH3, MSH6, MUTYH, NTHL1, PALB2, PMS2, POLD1, POLE, PTEN, RAD51C, RAD51D, RET, SDHA, SDHB, SDHC, SDHD, SMAD4, STK11,TERT, TP53, TSC1, TSC2, and VHL.    01/15/2021 - 03/17/2021 Chemotherapy   Patient is on Treatment Plan : OVARIAN Carboplatin (AUC 6) / Paclitaxel (175) q21d x 6 cycles     04/15/2021 Tumor Marker   Patient's tumor was tested for the following markers: Ca-125. Results of the tumor marker test revealed 7.7.   04/16/2021 Imaging   1. New left periaortic adenopathy and new left omental and mesenteric root  nodules, indicative of metastatic disease. 2. Right lower lobe nodules are stable. 3. Hepatic steatosis. 4. Cholelithiasis. 5. Left renal stone. 6.  Aortic atherosclerosis (ICD10-I70.0).   06/17/2021 Imaging   1. Small RIGHT retroperitoneal nodule not significant changed. Recommend attention on follow-up. 2. Two low-density para-aortic lymph nodes unchanged from comparison exam. 3. No clear evidence omental or peritoneal metastasis. 4. Stable small pulmonary nodule in the RIGHT lower lobe.     06/19/2021 Tumor Marker   Patient's tumor was tested for the following markers: CA-125. Results of the tumor marker test revealed 6.3.   12/17/2021 Imaging   Stable small low-attenuation retroperitoneal lymph nodes in left para-aortic region. No new or progressive disease within the abdomen or pelvis.   Stable 6 mm polypoid density along the nondependent gallbladder wall consistent with gallbladder polyp. Recommend continued imaging follow-up with ultrasound in 6-12 months.   Stable  tiny nonobstructing left renal calculus.   Mild increase in size of midline paraumbilical hernia, which contains only fat.   Aortic Atherosclerosis (ICD10-I70.0).   12/17/2021 Tumor Marker   Patient's tumor was tested for the following markers: CA-125. Results of the tumor marker test revealed 6.8.     PHYSICAL EXAMINATION: ECOG PERFORMANCE STATUS: 1 - Symptomatic but completely ambulatory  Vitals:   12/18/21 0907  BP: (!) 157/68  Pulse: 67  Resp: 18  Temp: 97.8 F (36.6 C)  SpO2: 99%   Filed Weights   12/18/21 0907  Weight: 175 lb 3.2 oz (79.5 kg)    GENERAL:alert, no distress and comfortable NEURO: alert & oriented x 3 with fluent speech, no focal motor/sensory deficits  LABORATORY DATA:  I have reviewed the data as listed    Component Value Date/Time   NA 142 12/15/2021 1459   K 4.1 12/15/2021 1459   CL 108 12/15/2021 1459   CO2 28 12/15/2021 1459   GLUCOSE 87 12/15/2021 1459   BUN 23 (H)  12/15/2021 1459   CREATININE 0.77 12/15/2021 1459   CREATININE 0.70 06/16/2021 0858   CALCIUM 9.9 12/15/2021 1459   PROT 7.6 12/15/2021 1459   ALBUMIN 4.6 12/15/2021 1459   AST 16 12/15/2021 1459   AST 17 06/16/2021 0858   ALT 26 12/15/2021 1459   ALT 30 06/16/2021 0858   ALKPHOS 73 12/15/2021 1459   BILITOT 0.3 12/15/2021 1459   BILITOT 0.3 06/16/2021 0858   GFRNONAA >60 12/15/2021 1459   GFRNONAA >60 06/16/2021 0858    No results found for: "SPEP", "UPEP"  Lab Results  Component Value Date   WBC 6.6 12/15/2021   NEUTROABS 3.9 12/15/2021   HGB 13.9 12/15/2021   HCT 41.3 12/15/2021   MCV 91.4 12/15/2021   PLT 255 12/15/2021      Chemistry      Component Value Date/Time   NA 142 12/15/2021 1459   K 4.1 12/15/2021 1459   CL 108 12/15/2021 1459   CO2 28 12/15/2021 1459   BUN 23 (H) 12/15/2021 1459   CREATININE 0.77 12/15/2021 1459   CREATININE 0.70 06/16/2021 0858      Component Value Date/Time   CALCIUM 9.9 12/15/2021 1459   ALKPHOS 73 12/15/2021 1459   AST 16 12/15/2021 1459   AST 17 06/16/2021 0858   ALT 26 12/15/2021 1459   ALT 30 06/16/2021 0858   BILITOT 0.3 12/15/2021 1459   BILITOT 0.3 06/16/2021 0858       RADIOGRAPHIC STUDIES: I have reviewed imaging study with the patient and her husband I have personally reviewed the radiological images as listed and agreed with the findings in the report. CT ABDOMEN PELVIS W CONTRAST  Result Date: 12/16/2021 CLINICAL DATA:  Follow-up ovarian carcinoma. Completed chemotherapy. Epigastric pain and bloating for 1 month. * Tracking Code: BO * EXAM: CT ABDOMEN AND PELVIS WITH CONTRAST TECHNIQUE: Multidetector CT imaging of the abdomen and pelvis was performed using the standard protocol following bolus administration of intravenous contrast. RADIATION DOSE REDUCTION: This exam was performed according to the departmental dose-optimization program which includes automated exposure control, adjustment of the mA and/or kV  according to patient size and/or use of iterative reconstruction technique. CONTRAST:  17m OMNIPAQUE IOHEXOL 300 MG/ML  SOLN COMPARISON:  06/16/2021 FINDINGS: Lower Chest: No acute findings. Sub-cm pulmonary nodule in the posterior right lower lobe remains stable. Hepatobiliary: No hepatic masses identified. 6 mm polypoid density along the nondependent gallbladder wall remains stable. No evidence of cholecystitis  or biliary ductal dilatation. Pancreas:  No mass or inflammatory changes. Spleen: Within normal limits in size and appearance. Adrenals/Urinary Tract: No masses identified. 2 mm calculus again seen in lower pole of left kidney. No evidence of ureteral calculi or hydronephrosis. Unremarkable unopacified urinary bladder. Stomach/Bowel: No evidence of obstruction, inflammatory process or abnormal fluid collections. Normal appendix visualized. Vascular/Lymphatic: 2 adjacent fluid low-attenuation lymph nodes in the left para-aortic region remains stable, measuring 12 mm and 14 mm in short axis diameter. No new or enlarging lymph nodes or other masses identified. No acute vascular findings. Aortic atherosclerotic calcification incidentally noted. Reproductive:  No mass or other significant abnormality. Other: Prior hysterectomy noted. Adnexal regions are unremarkable in appearance. No evidence of ascites. Mild increase in size of midline paraumbilical hernia, which contains only fat. Musculoskeletal:  No suspicious bone lesions identified. IMPRESSION: Stable small low-attenuation retroperitoneal lymph nodes in left para-aortic region. No new or progressive disease within the abdomen or pelvis. Stable 6 mm polypoid density along the nondependent gallbladder wall consistent with gallbladder polyp. Recommend continued imaging follow-up with ultrasound in 6-12 months. Stable tiny nonobstructing left renal calculus. Mild increase in size of midline paraumbilical hernia, which contains only fat. Aortic  Atherosclerosis (ICD10-I70.0). Electronically Signed   By: Marlaine Hind M.D.   On: 12/16/2021 14:03

## 2021-12-18 NOTE — Assessment & Plan Note (Signed)
She has been exercising on a regular basis and had strained her back recently I reviewed imaging study which show she has mild scoliosis She is on vitamin D supplement I recommend conservative approach with massage and to change to a different kind of exercise in the short-term

## 2021-12-18 NOTE — Assessment & Plan Note (Signed)
I have reviewed multiple imaging studies with the patient and her husband We reviewed results of her tumor marker She has no evidence of disease recurrence I plan to see her again in 6 months we have repeat tumor marker She will see GYN surgeon in 3 months for further follow-up

## 2021-12-30 ENCOUNTER — Ambulatory Visit: Payer: BC Managed Care – PPO | Admitting: Gastroenterology

## 2021-12-30 ENCOUNTER — Encounter: Payer: Self-pay | Admitting: Gastroenterology

## 2021-12-30 ENCOUNTER — Other Ambulatory Visit (INDEPENDENT_AMBULATORY_CARE_PROVIDER_SITE_OTHER): Payer: BC Managed Care – PPO

## 2021-12-30 VITALS — BP 118/60 | HR 72 | Ht 62.5 in | Wt 177.0 lb

## 2021-12-30 DIAGNOSIS — K5909 Other constipation: Secondary | ICD-10-CM

## 2021-12-30 DIAGNOSIS — R140 Abdominal distension (gaseous): Secondary | ICD-10-CM

## 2021-12-30 DIAGNOSIS — Z86010 Personal history of colonic polyps: Secondary | ICD-10-CM | POA: Diagnosis not present

## 2021-12-30 LAB — TSH: TSH: 1.73 u[IU]/mL (ref 0.35–5.50)

## 2021-12-30 LAB — T4, FREE: Free T4: 0.79 ng/dL (ref 0.60–1.60)

## 2021-12-30 MED ORDER — PEG 3350-KCL-NABCB-NACL-NASULF 236 G PO SOLR: 4000.0000 mL | Freq: Once | ORAL | 0 refills | Status: AC

## 2021-12-30 MED ORDER — METOCLOPRAMIDE HCL 5 MG PO TABS: 5.0000 mg | ORAL_TABLET | Freq: Two times a day (BID) | ORAL | 0 refills | Status: DC | PRN

## 2021-12-30 NOTE — Progress Notes (Signed)
[facility] Consult Note:  History: Brooke Khan 12/30/2021  Referring provider: [name], MD  Reason for consult/chief complaint: Abdominal Pain (Epigastric pain, pain thru to her back) and Constipation (CT showed she was impacted. She wants to discuss doing a Colonoscopy. Had to cancel last year do to having her ovarian cancer treatment.)   Subjective  HPI:  Brooke Khan was referred by her oncologist to see me for a recent change in bowel habits. 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  She underwent TAH/BSO and had subsequent chemotherapy that ended near the end of 2022.  12/18/2021 oncology office note reviewed.  It indicates note evidence of disease recurrence with plans for 8-monthfollow-up.  Patient is reporting constipation and a CT scan reportedly showed constipation by Dr.  GCalton Dachreview.  (see below).  Bowel habits were regular without abdominal pain or bloating even after completing the chemotherapy, but things changed in mid to late June.  She developed generalized abdominal bloating with feeling upper pressure in the back pain.  Bowel habits became less frequent and thin without bleeding.  It is not improved substantially take MiraLAX daily, as needed smooth move tea and increase water intake. She has been on calcium and vitamin D for years at stable doses.  Screening colonoscopy with me March 2017.  Complete exam, good prep, diminutive splenic flexure tubular adenoma removed. ROS:  Review of Systems  Constitutional:  Negative for appetite change and unexpected weight change.  HENT:  Negative for mouth sores and voice change.   Eyes:  Negative for pain and redness.  Respiratory:  Negative for cough and shortness of breath.   Cardiovascular:  Negative for chest pain and palpitations.  Genitourinary:  Negative for dysuria and hematuria.  Musculoskeletal:  Negative for  arthralgias and myalgias.  Skin:  Negative for pallor and rash.  Neurological:  Negative for weakness and headaches.  Hematological:  Negative for adenopathy.     Past Medical History: Past Medical History:  Diagnosis Date   Bradycardia    Chicken pox    Elevated blood pressure reading    Epigastric abdominal pain    Family history of breast cancer 12/26/2020   Family history of prostate cancer 12/26/2020   Heart murmur    since childhood- not always heard   Hyperglycemia    Hyperlipidemia    borderline- no meds   Hypothyroidism (acquired)    Lightheaded    ovarian ca 11/2020   Pre-diabetes    Vitamin D deficiency      Past Surgical History: Past Surgical History:  Procedure Laterality Date   CESAREAN SECTION     x2   cosmetic knee surgery     as child   LAPAROTOMY N/A 12/03/2020   Procedure: MINI LAPAROTOMY FOR CYST DECOMPRESSION;  Surgeon: REveritt Amber MD;  Location: [facility];  Service: Gynecology;  Laterality: N/A;   ROBOTIC ASSISTED TOTAL HYSTERECTOMY WITH BILATERAL SALPINGO OOPHERECTOMY N/A 12/03/2020   Procedure: XI ROBOTIC ASSISTED TOTAL HYSTERECTOMY WITH BILATERAL SALPINGO OOPHORECTOMY WITH PARA-AORTIC AND PELVIC LYMPHADNECTOMY AND OMENTECTOMY;  Surgeon: REveritt Amber MD;  Location: [facility];  Service: Gynecology;  Laterality: N/A;     Family History: Family History  Problem Relation Age of Onset   Elevated Lipids Mother    Prostate cancer Father        dx 658s  Elevated Lipids Father    Heart disease Father  mid 22s. had been remote smoker.    Hypertension Father    Diabetes Father        mid 73s   Dementia Father        vascular based   Skin cancer Father        SCC, dx after 24   Healthy Brother    Breast cancer Maternal Aunt 63   Prostate cancer Maternal Uncle        dx 75s   Lymphoma Paternal Uncle        dx after 28   Diabetes Maternal Grandmother    Arthritis Maternal Grandfather    Diabetes Paternal Grandfather    Cancer Cousin         unknown type; ?throat ?lymphoma d. 33   Colon cancer Neg Hx    Colon polyps Neg Hx    Esophageal cancer Neg Hx    Rectal cancer Neg Hx    Stomach cancer Neg Hx     Social History: Social History   Socioeconomic History   Marital status: Married    Spouse name: Not on file   Number of children: 2   Years of education: Not on file   Highest education level: Not on file  Occupational History   Occupation: realtor  Tobacco Use   Smoking status: Never   Smokeless tobacco: Never  Vaping Use   Vaping Use: Never used  Substance and Sexual Activity   Alcohol use: Yes    Comment: rare   Drug use: No   Sexual activity: Yes    Partners: Male    Comment: husband  Other Topics Concern   Not on file  Social History Narrative   Married 30 years October 2020. Daughter went to [place]. Son went to [place]. In 2020, daughter Corporate treasurer in [place], son moving to [place] and to work for [employer] in Engineer, mining. Has a Biomedical scientist in [place].       Hobbies: tennis, plays mahjong in womens group      Social Determinants of Health   Financial Resource Strain: Not on file  Food Insecurity: Not on file  Transportation Needs: Not on file  Physical Activity: Not on file  Stress: Not on file  Social Connections: Not on file    Allergies: Allergies  Allergen Reactions   Codeine Nausea Only and Other (See Comments)    "Feel bad"    Outpatient Meds: Current Outpatient Medications  Medication Sig Dispense Refill   amLODipine (NORVASC) 5 MG tablet Take 1 tablet (5 mg total) by mouth daily. 90 tablet 2   calcium carbonate (TUMS - DOSED IN MG ELEMENTAL CALCIUM) 500 MG chewable tablet Chew 1 tablet by mouth daily.     Cholecalciferol (VITAMIN D3) 1.25 MG (50000 UT) CAPS Vitamin D3  5,000 iu qd     levothyroxine (SYNTHROID) 50 MCG tablet TAKE 1 TABLET BY MOUTH EVERY DAY 90 tablet 3   metoCLOPramide (REGLAN) 5 MG tablet Take 1 tablet (5 mg total) by mouth  every 12 (twelve) hours as needed for up to 2 doses for nausea. Take 30-45 minutes before evening and AM doses of bowel preparation solution. 2 tablet 0   OVER THE COUNTER MEDICATION Smooth move tea daily     OVER THE COUNTER MEDICATION Mushroom extracts: reishi, [place] tail, lion's mane daily     polyethylene glycol (GOLYTELY) 236 g solution Take 4,000 mLs by mouth once for 1 dose. 4000 mL 0  polyethylene glycol (MIRALAX / GLYCOLAX) 17 g packet Take 17 g by mouth daily.     rosuvastatin (CRESTOR) 5 MG tablet Take 1 tablet (5 mg total) by mouth daily. 90 tablet 2   vitamin B-12 (CYANOCOBALAMIN) 1000 MCG tablet Take 1,000 mcg by mouth in the morning.     vitamin C (ASCORBIC ACID) 500 MG tablet Take 500 mg by mouth in the morning.     No current facility-administered medications for this visit.      ___________________________________________________________________ Objective   Exam:  BP 118/60   Pulse 72   Ht 5' 2.5" (1.588 m)   Wt 177 lb (80.3 kg)   LMP 05/11/2014   BMI 31.86 kg/m  Wt Readings from Last 3 Encounters:  12/30/21 177 lb (80.3 kg)  12/18/21 175 lb 3.2 oz (79.5 kg)  09/18/21 177 lb 6.4 oz (80.5 kg)    General: Well-appearing, pleasant and conversational Eyes: sclera anicteric, no redness ENT: oral mucosa moist without lesions, no cervical or supraclavicular lymphadenopathy CV: Regular without murmur, no JVD, no peripheral edema Resp: clear to auscultation bilaterally, normal RR and effort noted GI: soft, no tenderness, with active bowel sounds. No guarding or palpable organomegaly noted.  Nondistended Skin; warm and dry, no rash or jaundice noted Neuro: awake, alert and oriented x 3. Normal gross motor function and fluent speech  Labs:     Latest Ref Rng & Units 12/15/2021    2:59 PM 09/15/2021    2:30 PM 06/16/2021    8:58 AM  CBC  WBC 4.0 - 10.5 K/uL 6.6  5.7  5.1   Hemoglobin 12.0 - 15.0 g/dL 13.9  13.3  13.5   Hematocrit 36.0 - 46.0 % 41.3  39.7  41.0    Platelets 150 - 400 K/uL 255  298  218       Latest Ref Rng & Units 12/15/2021    2:59 PM 09/15/2021    2:30 PM 06/16/2021    8:58 AM  CMP  Glucose 70 - 99 mg/dL 87  84  111   BUN 6 - 20 mg/dL '23  16  21   '$ Creatinine 0.44 - 1.00 mg/dL 0.77  0.88  0.70   Sodium 135 - 145 mmol/L 142  144  144   Potassium 3.5 - 5.1 mmol/L 4.1  4.1  4.3   Chloride 98 - 111 mmol/L 108  110  109   CO2 22 - 32 mmol/L '28  29  29   '$ Calcium 8.9 - 10.3 mg/dL 9.9  9.9  9.8   Total Protein 6.5 - 8.1 g/dL 7.6  7.6  7.4   Total Bilirubin 0.3 - 1.2 mg/dL 0.3  0.4  0.3   Alkaline Phos 38 - 126 U/L 73  79  64   AST 15 - 41 U/L '16  16  17   '$ ALT 0 - 44 U/L '26  27  30    '$ Lab Results  Component Value Date   TSH 2.27 09/03/2020     Radiologic Studies:  CLINICAL DATA:  Follow-up ovarian carcinoma. Completed chemotherapy. Epigastric pain and bloating for 1 month.   * Tracking Code: BO *   EXAM: CT ABDOMEN AND PELVIS WITH CONTRAST   TECHNIQUE: Multidetector CT imaging of the abdomen and pelvis was performed using the standard protocol following bolus administration of intravenous contrast.   RADIATION DOSE REDUCTION: This exam was performed according to the departmental dose-optimization program which includes automated exposure control, adjustment of the mA and/or  kV according to patient size and/or use of iterative reconstruction technique.   CONTRAST:  166m OMNIPAQUE IOHEXOL 300 MG/ML  SOLN   COMPARISON:  06/16/2021   FINDINGS: Lower Chest: No acute findings. Sub-cm pulmonary nodule in the posterior right lower lobe remains stable.   Hepatobiliary: No hepatic masses identified. 6 mm polypoid density along the nondependent gallbladder wall remains stable. No evidence of cholecystitis or biliary ductal dilatation.   Pancreas:  No mass or inflammatory changes.   Spleen: Within normal limits in size and appearance.   Adrenals/Urinary Tract: No masses identified. 2 mm calculus again seen in lower  pole of left kidney. No evidence of ureteral calculi or hydronephrosis. Unremarkable unopacified urinary bladder.   Stomach/Bowel: No evidence of obstruction, inflammatory process or abnormal fluid collections. Normal appendix visualized.   Vascular/Lymphatic: 2 adjacent fluid low-attenuation lymph nodes in the left para-aortic region remains stable, measuring 12 mm and 14 mm in short axis diameter. No new or enlarging lymph nodes or other masses identified. No acute vascular findings. Aortic atherosclerotic calcification incidentally noted.   Reproductive:  No mass or other significant abnormality.   Other: Prior hysterectomy noted. Adnexal regions are unremarkable in appearance. No evidence of ascites. Mild increase in size of midline paraumbilical hernia, which contains only fat.   Musculoskeletal:  No suspicious bone lesions identified.   IMPRESSION: Stable small low-attenuation retroperitoneal lymph nodes in left para-aortic region. No new or progressive disease within the abdomen or pelvis.   Stable 6 mm polypoid density along the nondependent gallbladder wall consistent with gallbladder polyp. Recommend continued imaging follow-up with ultrasound in 6-12 months.   Stable tiny nonobstructing left renal calculus.   Mild increase in size of midline paraumbilical hernia, which contains only fat.   Aortic Atherosclerosis (ICD10-I70.0).     Electronically Signed   By: JMarlaine HindM.D.   On: 12/16/2021 14:03  (Images personally reviewed- -watery stool throughout the colon, there is no impaction or distention of the colon or small bowel - HD)  Assessment: Encounter Diagnoses  Name Primary?   Abdominal bloating Yes   Chronic constipation    Personal history of colonic polyps     Minute is due for a surveillance colonoscopy.  In addition, she has recent change in bowel habits with constipation and abdominal bloating of unclear cause.  Although it did not occur soon  after the surgery or chemotherapy, I wonder if her surgical menopause or additional treatment may have somehow led to a motility disorder. No apparent obstruction or mass on CT.  Plan: TSH and Free T4,  Vit D level  Colonoscopy.  She was agreeable after discussion of procedure and risks.  The benefits and risks of the planned procedure were described in detail with the patient or (when appropriate) their health care proxy.  Risks were outlined as including, but not limited to, bleeding, infection, perforation, adverse medication reaction leading to cardiac or pulmonary decompensation, pancreatitis (if ERCP).  The limitation of incomplete mucosal visualization was also discussed.  No guarantees or warranties were given.  (Dulcolax/split dose GoLytely prep because of her constipation.  Reglan 5 mg prescribed to take as 1 tablet before evening dose and another before morning dose)  Samples Linzess 145 mcg daily (stop miralax while doing that)  Thank you for the courtesy of this consult.  Please call me with any questions or concerns.  HNelida MeuseIII  CC: Referring provider noted above

## 2021-12-30 NOTE — Patient Instructions (Addendum)
_______________________________________________________  If you are age 58 or older, your body mass index should be between 23-30. Your Body mass index is 31.86 kg/m. If this is out of the aforementioned range listed, please consider follow up with your Primary Care Provider.  If you are age 34 or younger, your body mass index should be between 19-25. Your Body mass index is 31.86 kg/m. If this is out of the aformentioned range listed, please consider follow up with your Primary Care Provider.   ________________________________________________________  The [facility] providers would like to encourage you to use [facility] to communicate with providers for non-urgent requests or questions.  Due to long hold times on the telephone, sending your provider a message by [facility] may be a faster and more efficient way to get a response.  Please allow 48 business hours for a response.  Please remember that this is for non-urgent requests.  _______________________________________________________  Your provider has requested that you go to the basement level for lab work before leaving today. Press "B" on the elevator. The lab is located at the first door on the left as you exit the elevator.  You have been scheduled for a colonoscopy. Please follow written instructions given to you at your visit today.  Please pick up your prep supplies at the pharmacy within the next 1-3 days. If you use inhalers (even only as needed), please bring them with you on the day of your procedure.  We have sent the following medications to your pharmacy for you to pick up at your convenience: Regan take 1 tablet 30 minutes before drinking the prep Golytely  We have given you samples of the following medication to take: Linzess 3 boxes Lot [id number]  Exp 11-2022  take 1 tablet daily. Call in 1.5 weeks to let us know how it is working  It was a pleasure to see you today!  Thank you for trusting me with your gastrointestinal  care!

## 2022-01-05 LAB — VITAMIN D 1,25 DIHYDROXY
Vitamin D 1, 25 (OH)2 Total: 49 pg/mL (ref 18–72)
Vitamin D2 1, 25 (OH)2: <8 pg/mL
Vitamin D3 1, 25 (OH)2: 49 pg/mL

## 2022-01-09 ENCOUNTER — Encounter: Payer: Self-pay | Admitting: Gastroenterology

## 2022-01-09 ENCOUNTER — Other Ambulatory Visit: Payer: Self-pay

## 2022-01-09 MED ORDER — LINACLOTIDE 145 MCG PO CAPS: 145.0000 ug | ORAL_CAPSULE | Freq: Every day | ORAL | 1 refills | Status: DC

## 2022-02-02 ENCOUNTER — Encounter: Payer: Self-pay | Admitting: *Deleted

## 2022-02-05 ENCOUNTER — Encounter: Payer: Self-pay | Admitting: Gastroenterology

## 2022-02-12 ENCOUNTER — Ambulatory Visit (AMBULATORY_SURGERY_CENTER): Payer: BC Managed Care – PPO | Admitting: Gastroenterology

## 2022-02-12 ENCOUNTER — Encounter: Payer: Self-pay | Admitting: Gastroenterology

## 2022-02-12 VITALS — BP 117/45 | HR 49 | Temp 98.7°F | Resp 11 | Ht 62.0 in | Wt 177.0 lb

## 2022-02-12 DIAGNOSIS — Z09 Encounter for follow-up examination after completed treatment for conditions other than malignant neoplasm: Secondary | ICD-10-CM | POA: Diagnosis not present

## 2022-02-12 DIAGNOSIS — Z1211 Encounter for screening for malignant neoplasm of colon: Secondary | ICD-10-CM | POA: Diagnosis not present

## 2022-02-12 DIAGNOSIS — Z86010 Personal history of colonic polyps: Secondary | ICD-10-CM

## 2022-02-12 DIAGNOSIS — D122 Benign neoplasm of ascending colon: Secondary | ICD-10-CM | POA: Diagnosis not present

## 2022-02-12 MED ORDER — SODIUM CHLORIDE 0.9 % IV SOLN: 500.0000 mL | Freq: Once | INTRAVENOUS | Status: DC

## 2022-02-12 NOTE — Op Note (Signed)
[facility] Patient Name: Brooke Khan Procedure Date: 02/12/2022 8:29 AM MRN: [id number] Endoscopist: Mallie Mussel L. Loletha Khan , MD Age: 56 Referring MD:  Date of Birth: 10/03/1965 Gender: Female Account #: [id number] Procedure:                Colonoscopy Indications:              Surveillance: Personal history of adenomatous                            polyps on last colonoscopy > 5 years ago                            (diminutive TA March 2017)                           Incidental change in bowel habits noted (see Aug                            2023 office note for details - improved on                            as-needed Linzess) Medicines:                Monitored Anesthesia Care Procedure:                Pre-Anesthesia Assessment:                           - Prior to the procedure, a History and Physical                            was performed, and patient medications and                            allergies were reviewed. The patient's tolerance of                            previous anesthesia was also reviewed. The risks                            and benefits of the procedure and the sedation                            options and risks were discussed with the patient.                            All questions were answered, and informed consent                            was obtained. Prior Anticoagulants: The patient has                            taken no previous anticoagulant or antiplatelet  agents. ASA Grade Assessment: II - A patient with                            mild systemic disease. After reviewing the risks                            and benefits, the patient was deemed in                            satisfactory condition to undergo the procedure.                           After obtaining informed consent, the colonoscope                            was passed under direct vision. Throughout the                            procedure,  the patient's blood pressure, pulse, and                            oxygen saturations were monitored continuously. The                            CF [id number] #[id number] was introduced through the anus                            and advanced to the the cecum, identified by                            appendiceal orifice and ileocecal valve. The                            colonoscopy was performed without difficulty. The                            patient tolerated the procedure well. The quality                            of the bowel preparation was excellent. The                            ileocecal valve, appendiceal orifice, and rectum                            were photographed. The bowel preparation used was                            GoLYTELY. Scope In: 8:37:07 AM Scope Out: 8:52:39 AM Scope Withdrawal Time: 0 hours 10 minutes 11 seconds  Total Procedure Duration: 0 hours 15 minutes 32 seconds  Findings:                 The perianal and digital rectal examinations were  normal.                           Repeat examination of right colon under NBI                            performed.                           An area of melanosis was found in the entire colon.                           A 4 mm polyp was found in the mid ascending colon.                            The polyp was sessile. The polyp was removed with a                            cold snare. Resection and retrieval were complete.                           The exam was otherwise without abnormality on                            direct and retroflexion views. (sigmoid somewhat                            redundant) Complications:            No immediate complications. Estimated Blood Loss:     Estimated blood loss was minimal. Impression:               - Melanosis in the colon.                           - One 4 mm polyp in the mid ascending colon,                            removed with a cold snare.  Resected and retrieved.                           - The examination was otherwise normal on direct                            and retroflexion views.                           Change in bowel habits seems to be a combination of                            post-TAH/BSO/CTX changes in colonic motility and                            rectosigmoid anatomy with side effect of some  supplements patient had been taking. Recommendation:           - Patient has a contact number available for                            emergencies. The signs and symptoms of potential                            delayed complications were discussed with the                            patient. Return to normal activities tomorrow.                            Written discharge instructions were provided to the                            patient.                           - Resume previous diet.                           - Continue present medications.                           - Await pathology results.                           - Repeat colonoscopy is recommended for                            surveillance. The colonoscopy date will be                            determined after pathology results from today's                            exam become available for review. [place] Talcott L. Loletha Carrow, MD 02/12/2022 8:59:18 AM This report has been signed electronically.

## 2022-02-12 NOTE — Progress Notes (Signed)
Pt awake, alert and oriented. VSS. Airway intact. SBAR complete to RN. All questions answered.  

## 2022-02-12 NOTE — Progress Notes (Signed)
Called to room to assist during endoscopic procedure.  Patient ID and intended procedure confirmed with present staff. Received instructions for my participation in the procedure from the performing physician.  

## 2022-02-12 NOTE — Progress Notes (Signed)
Pt's states no medical or surgical changes since previsit or office visit. VS assessed by D.T 

## 2022-02-12 NOTE — Progress Notes (Signed)
History and Physical:  This patient presents for endoscopic testing for: Encounter Diagnoses  Name Primary?   Abdominal bloating Yes   Chronic constipation    Personal history of colonic polyps     56 year old woman last seen in the office 12/30/2021 with a history of diminutive tubular adenoma on last colonoscopy March 2017.  Incidentally, she has had a change in bowel habits with constipation and bloating starting sometime after a TAH/BSO and CTX for ovarian cancer. TSH and free T4 recently normal.  No disease recurrence or other pertinent findings to explain constipation on recent CT abdomen/ pelvis. Symptoms improved on as needed Linzess  Patient is otherwise without complaints or active issues today.   Past Medical History: Past Medical History:  Diagnosis Date   Bradycardia    Chicken pox    Elevated blood pressure reading    Epigastric abdominal pain    Family history of breast cancer 12/26/2020   Family history of prostate cancer 12/26/2020   Heart murmur    since childhood- not always heard   Hyperglycemia    Hyperlipidemia    borderline- no meds   Hypothyroidism (acquired)    Lightheaded    ovarian ca 11/2020   Pre-diabetes    Vitamin D deficiency      Past Surgical History: Past Surgical History:  Procedure Laterality Date   CESAREAN SECTION     x2   cosmetic knee surgery     as child   LAPAROTOMY N/A 12/03/2020   Procedure: MINI LAPAROTOMY FOR CYST DECOMPRESSION;  Surgeon: Everitt Amber, MD;  Location: [facility];  Service: Gynecology;  Laterality: N/A;   ROBOTIC ASSISTED TOTAL HYSTERECTOMY WITH BILATERAL SALPINGO OOPHERECTOMY N/A 12/03/2020   Procedure: XI ROBOTIC ASSISTED TOTAL HYSTERECTOMY WITH BILATERAL SALPINGO OOPHORECTOMY WITH PARA-AORTIC AND PELVIC LYMPHADNECTOMY AND OMENTECTOMY;  Surgeon: Everitt Amber, MD;  Location: [facility];  Service: Gynecology;  Laterality: N/A;    Allergies: Allergies  Allergen Reactions   Codeine Nausea Only and Other (See  Comments)    "Feel bad"    Outpatient Meds: Current Outpatient Medications  Medication Sig Dispense Refill   amLODipine (NORVASC) 5 MG tablet Take 1 tablet (5 mg total) by mouth daily. 90 tablet 2   calcium carbonate (TUMS - DOSED IN MG ELEMENTAL CALCIUM) 500 MG chewable tablet Chew 1 tablet by mouth daily.     Cholecalciferol (VITAMIN D3) 1.25 MG (50000 UT) CAPS Vitamin D3  5,000 iu qd     levothyroxine (SYNTHROID) 50 MCG tablet TAKE 1 TABLET BY MOUTH EVERY DAY 90 tablet 3   linaclotide (LINZESS) 145 MCG CAPS capsule Take 1 capsule (145 mcg total) by mouth daily before breakfast. 90 capsule 1   metoCLOPramide (REGLAN) 5 MG tablet Take 1 tablet (5 mg total) by mouth every 12 (twelve) hours as needed for up to 2 doses for nausea. Take 30-45 minutes before evening and AM doses of bowel preparation solution. 2 tablet 0   OVER THE COUNTER MEDICATION Smooth move tea daily     rosuvastatin (CRESTOR) 5 MG tablet Take 1 tablet (5 mg total) by mouth daily. 90 tablet 2   vitamin B-12 (CYANOCOBALAMIN) 1000 MCG tablet Take 1,000 mcg by mouth in the morning.     vitamin C (ASCORBIC ACID) 500 MG tablet Take 500 mg by mouth in the morning.     OVER THE COUNTER MEDICATION Mushroom extracts: reishi, [place] tail, lion's mane daily (Patient not taking: Reported on 02/12/2022)     polyethylene glycol (MIRALAX / GLYCOLAX)  17 g packet Take 17 g by mouth daily. (Patient not taking: Reported on 02/12/2022)     Current Facility-Administered Medications  Medication Dose Route Frequency Provider Last Rate Last Admin   0.9 %  sodium chloride infusion  500 mL Intravenous Once Danis, Kirke Corin, MD          ___________________________________________________________________ Objective   Exam:  BP (!) 167/65   Pulse 68   Temp 98.7 F (37.1 C) (Skin)   Ht '5\' 2"'$  (1.575 m)   Wt 177 lb (80.3 kg)   LMP 05/11/2014   SpO2 97%   BMI 32.37 kg/m   CV: regular , S1/S2 Resp: clear to auscultation bilaterally, normal  RR and effort noted GI: soft, no tenderness, with active bowel sounds.   Assessment: Encounter Diagnoses  Name Primary?   Abdominal bloating Yes   Chronic constipation    Personal history of colonic polyps      Plan: Colonoscopy  The benefits and risks of the planned procedure were described in detail with the patient or (when appropriate) their health care proxy.  Risks were outlined as including, but not limited to, bleeding, infection, perforation, adverse medication reaction leading to cardiac or pulmonary decompensation, pancreatitis (if ERCP).  The limitation of incomplete mucosal visualization was also discussed.  No guarantees or warranties were given.    The patient is appropriate for an endoscopic procedure in the ambulatory setting.   - Wilfrid Lund, MD

## 2022-02-12 NOTE — Patient Instructions (Signed)
HANDOUTS PROVIDED ON: POLYPS  The polyp removed today have been sent for pathology.  The results can take 1-3 weeks to receive.  When your next colonoscopy should occur will be based on the pathology results.    You may resume your previous diet and medication schedule.  Thank you for allowing [place] to care for you today!!!   YOU HAD AN ENDOSCOPIC PROCEDURE TODAY AT [facility]:   Refer to the procedure report that was given to you for any specific questions about what was found during the examination.  If the procedure report does not answer your questions, please call your gastroenterologist to clarify.  If you requested that your care partner not be given the details of your procedure findings, then the procedure report has been included in a sealed envelope for you to review at your convenience later.  YOU SHOULD EXPECT: Some feelings of bloating in the abdomen. Passage of more gas than usual.  Walking can help get rid of the air that was put into your GI tract during the procedure and reduce the bloating. If you had a lower endoscopy (such as a colonoscopy or flexible sigmoidoscopy) you may notice spotting of blood in your stool or on the toilet paper. If you underwent a bowel prep for your procedure, you may not have a normal bowel movement for a few days.  Please Note:  You might notice some irritation and congestion in your nose or some drainage.  This is from the oxygen used during your procedure.  There is no need for concern and it should clear up in a day or so.  SYMPTOMS TO REPORT IMMEDIATELY:  Following lower endoscopy (colonoscopy or flexible sigmoidoscopy):  Excessive amounts of blood in the stool  Significant tenderness or worsening of abdominal pains  Swelling of the abdomen that is new, acute  Fever of 100F or higher  For urgent or emergent issues, a gastroenterologist can be reached at any hour by calling [phone]. Do not use MyChart messaging for  urgent concerns.    DIET:  We do recommend a small meal at first, but then you may proceed to your regular diet.  Drink plenty of fluids but you should avoid alcoholic beverages for 24 hours.  ACTIVITY:  You should plan to take it easy for the rest of today and you should NOT DRIVE or use heavy machinery until tomorrow (because of the sedation medicines used during the test).    FOLLOW UP: Our staff will call the number listed on your records the next business day following your procedure.  We will call around 7:15- 8:00 am to check on you and address any questions or concerns that you may have regarding the information given to you following your procedure. If we do not reach you, we will leave a message.     If any biopsies were taken you will be contacted by phone or by letter within the next 1-3 weeks.  Please call us at [phone] if you have not heard about the biopsies in 3 weeks.    SIGNATURES/CONFIDENTIALITY: You and/or your care partner have signed paperwork which will be entered into your electronic medical record.  These signatures attest to the fact that that the information above on your After Visit Summary has been reviewed and is understood.  Full responsibility of the confidentiality of this discharge information lies with you and/or your care-partner.

## 2022-02-13 ENCOUNTER — Other Ambulatory Visit: Payer: Self-pay | Admitting: Family Medicine

## 2022-02-13 ENCOUNTER — Other Ambulatory Visit: Payer: Self-pay

## 2022-02-13 ENCOUNTER — Telehealth: Payer: Self-pay | Admitting: *Deleted

## 2022-02-13 DIAGNOSIS — E785 Hyperlipidemia, unspecified: Secondary | ICD-10-CM

## 2022-02-13 DIAGNOSIS — Z9221 Personal history of antineoplastic chemotherapy: Secondary | ICD-10-CM

## 2022-02-13 DIAGNOSIS — Z79899 Other long term (current) drug therapy: Secondary | ICD-10-CM

## 2022-02-13 MED ORDER — ROSUVASTATIN CALCIUM 5 MG PO TABS: 5.0000 mg | ORAL_TABLET | Freq: Every day | ORAL | 0 refills | Status: DC

## 2022-02-13 MED ORDER — AMLODIPINE BESYLATE 5 MG PO TABS: 5.0000 mg | ORAL_TABLET | Freq: Every day | ORAL | 2 refills | Status: DC

## 2022-02-13 NOTE — Telephone Encounter (Signed)
Follow up call attempt.  LVM to call if any questions or concerns. 

## 2022-02-13 NOTE — Addendum Note (Signed)
Addended by: Carter Kitten D on: 02/13/2022 09:33 AM   Modules accepted: Orders

## 2022-02-19 ENCOUNTER — Encounter: Payer: Self-pay | Admitting: Gastroenterology

## 2022-03-18 ENCOUNTER — Encounter: Payer: Self-pay | Admitting: Gynecologic Oncology

## 2022-03-18 ENCOUNTER — Inpatient Hospital Stay: Payer: BC Managed Care – PPO | Attending: Gynecologic Oncology

## 2022-03-18 ENCOUNTER — Other Ambulatory Visit: Payer: Self-pay

## 2022-03-18 DIAGNOSIS — I700 Atherosclerosis of aorta: Secondary | ICD-10-CM | POA: Insufficient documentation

## 2022-03-18 DIAGNOSIS — K824 Cholesterolosis of gallbladder: Secondary | ICD-10-CM | POA: Diagnosis not present

## 2022-03-18 DIAGNOSIS — Z8042 Family history of malignant neoplasm of prostate: Secondary | ICD-10-CM | POA: Insufficient documentation

## 2022-03-18 DIAGNOSIS — Z803 Family history of malignant neoplasm of breast: Secondary | ICD-10-CM | POA: Diagnosis not present

## 2022-03-18 DIAGNOSIS — Z8543 Personal history of malignant neoplasm of ovary: Secondary | ICD-10-CM | POA: Insufficient documentation

## 2022-03-18 DIAGNOSIS — Z90722 Acquired absence of ovaries, bilateral: Secondary | ICD-10-CM | POA: Diagnosis not present

## 2022-03-18 DIAGNOSIS — N200 Calculus of kidney: Secondary | ICD-10-CM | POA: Insufficient documentation

## 2022-03-18 DIAGNOSIS — Z90710 Acquired absence of both cervix and uterus: Secondary | ICD-10-CM | POA: Diagnosis not present

## 2022-03-18 DIAGNOSIS — K429 Umbilical hernia without obstruction or gangrene: Secondary | ICD-10-CM | POA: Diagnosis not present

## 2022-03-18 DIAGNOSIS — Z807 Family history of other malignant neoplasms of lymphoid, hematopoietic and related tissues: Secondary | ICD-10-CM | POA: Diagnosis not present

## 2022-03-18 DIAGNOSIS — C561 Malignant neoplasm of right ovary: Secondary | ICD-10-CM

## 2022-03-18 DIAGNOSIS — Z9079 Acquired absence of other genital organ(s): Secondary | ICD-10-CM | POA: Diagnosis not present

## 2022-03-18 DIAGNOSIS — Z808 Family history of malignant neoplasm of other organs or systems: Secondary | ICD-10-CM | POA: Diagnosis not present

## 2022-03-18 LAB — CBC WITH DIFFERENTIAL/PLATELET
Abs Immature Granulocytes: 0.01 10*3/uL (ref 0.00–0.07)
Basophils Absolute: 0 10*3/uL (ref 0.0–0.1)
Basophils Relative: 0 %
Eosinophils Absolute: 0.2 10*3/uL (ref 0.0–0.5)
Eosinophils Relative: 3 %
HCT: 39.2 % (ref 36.0–46.0)
Hemoglobin: 13.3 g/dL (ref 12.0–15.0)
Immature Granulocytes: 0 %
Lymphocytes Relative: 28 %
Lymphs Abs: 1.9 10*3/uL (ref 0.7–4.0)
MCH: 31.1 pg (ref 26.0–34.0)
MCHC: 33.9 g/dL (ref 30.0–36.0)
MCV: 91.8 fL (ref 80.0–100.0)
Monocytes Absolute: 0.5 10*3/uL (ref 0.1–1.0)
Monocytes Relative: 7 %
Neutro Abs: 4 10*3/uL (ref 1.7–7.7)
Neutrophils Relative %: 62 %
Platelets: 275 10*3/uL (ref 150–400)
RBC: 4.27 MIL/uL (ref 3.87–5.11)
RDW: 12.6 % (ref 11.5–15.5)
WBC: 6.5 10*3/uL (ref 4.0–10.5)
nRBC: 0 % (ref 0.0–0.2)

## 2022-03-18 LAB — CMP (CANCER CENTER ONLY)
ALT: 26 U/L (ref 0–44)
AST: 20 U/L (ref 15–41)
Albumin: 4.5 g/dL (ref 3.5–5.0)
Alkaline Phosphatase: 74 U/L (ref 38–126)
Anion gap: 9 (ref 5–15)
BUN: 22 mg/dL — ABNORMAL HIGH (ref 6–20)
CO2: 26 mmol/L (ref 22–32)
Calcium: 9.8 mg/dL (ref 8.9–10.3)
Chloride: 111 mmol/L (ref 98–111)
Creatinine: 0.97 mg/dL (ref 0.44–1.00)
GFR, Estimated: >60 mL/min (ref >60)
Glucose, Bld: 100 mg/dL — ABNORMAL HIGH (ref 70–99)
Potassium: 4 mmol/L (ref 3.5–5.1)
Sodium: 146 mmol/L — ABNORMAL HIGH (ref 135–145)
Total Bilirubin: 0.6 mg/dL (ref 0.3–1.2)
Total Protein: 7.4 g/dL (ref 6.5–8.1)

## 2022-03-19 ENCOUNTER — Telehealth: Payer: Self-pay | Admitting: *Deleted

## 2022-03-19 LAB — CA 125: Cancer Antigen (CA) 125: 6.5 U/mL (ref 0.0–38.1)

## 2022-03-19 NOTE — Telephone Encounter (Signed)
Per Dr Berline Lopes moved the patient from 11/10 to 11/14

## 2022-03-20 ENCOUNTER — Inpatient Hospital Stay: Payer: BC Managed Care – PPO

## 2022-03-20 ENCOUNTER — Inpatient Hospital Stay: Payer: BC Managed Care – PPO | Admitting: Gynecologic Oncology

## 2022-03-20 DIAGNOSIS — C561 Malignant neoplasm of right ovary: Secondary | ICD-10-CM

## 2022-03-24 ENCOUNTER — Inpatient Hospital Stay (HOSPITAL_BASED_OUTPATIENT_CLINIC_OR_DEPARTMENT_OTHER): Payer: BC Managed Care – PPO | Admitting: Gynecologic Oncology

## 2022-03-24 ENCOUNTER — Encounter: Payer: Self-pay | Admitting: Gynecologic Oncology

## 2022-03-24 ENCOUNTER — Other Ambulatory Visit: Payer: Self-pay

## 2022-03-24 VITALS — BP 126/65 | HR 65 | Temp 98.4°F | Resp 18 | Ht 63.19 in | Wt 174.4 lb

## 2022-03-24 DIAGNOSIS — Z808 Family history of malignant neoplasm of other organs or systems: Secondary | ICD-10-CM | POA: Diagnosis not present

## 2022-03-24 DIAGNOSIS — Z807 Family history of other malignant neoplasms of lymphoid, hematopoietic and related tissues: Secondary | ICD-10-CM | POA: Diagnosis not present

## 2022-03-24 DIAGNOSIS — N200 Calculus of kidney: Secondary | ICD-10-CM | POA: Diagnosis not present

## 2022-03-24 DIAGNOSIS — Z8543 Personal history of malignant neoplasm of ovary: Secondary | ICD-10-CM | POA: Diagnosis not present

## 2022-03-24 DIAGNOSIS — Z90722 Acquired absence of ovaries, bilateral: Secondary | ICD-10-CM | POA: Diagnosis not present

## 2022-03-24 DIAGNOSIS — Z9079 Acquired absence of other genital organ(s): Secondary | ICD-10-CM | POA: Diagnosis not present

## 2022-03-24 DIAGNOSIS — C561 Malignant neoplasm of right ovary: Secondary | ICD-10-CM

## 2022-03-24 DIAGNOSIS — Z8042 Family history of malignant neoplasm of prostate: Secondary | ICD-10-CM | POA: Diagnosis not present

## 2022-03-24 DIAGNOSIS — K824 Cholesterolosis of gallbladder: Secondary | ICD-10-CM | POA: Diagnosis not present

## 2022-03-24 DIAGNOSIS — Z90710 Acquired absence of both cervix and uterus: Secondary | ICD-10-CM | POA: Diagnosis not present

## 2022-03-24 DIAGNOSIS — K429 Umbilical hernia without obstruction or gangrene: Secondary | ICD-10-CM | POA: Diagnosis not present

## 2022-03-24 DIAGNOSIS — I700 Atherosclerosis of aorta: Secondary | ICD-10-CM | POA: Diagnosis not present

## 2022-03-24 DIAGNOSIS — Z803 Family history of malignant neoplasm of breast: Secondary | ICD-10-CM | POA: Diagnosis not present

## 2022-03-24 NOTE — Patient Instructions (Signed)
It was good to see you today.  I do not see or feel any evidence of cancer recurrence on your exam.  I will see you for follow-up in 6 months.  We will plan to get a CT scan at your next visit with me.  As always, if you develop any new and concerning symptoms before your next visit, please call to see me sooner.

## 2022-03-24 NOTE — Progress Notes (Signed)
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 Results of the tumor marker test revealed 62.7.   11/20/2020 Imaging   CT scan of abdomen and pelvis  1. There is a large, cystic mass of the right ovary with peripheral nodular soft tissue components measuring at least 19.8 x 13.3 x 19.8 cm. Findings are consistent with primary ovarian malignancy. 2. Minimal stranding of the omentum in the ventral abdomen and minimal thickening of the peritoneum in the right lower quadrant in the right paracolic gutter without evidence of discrete omental or peritoneal nodularity. Findings are modestly suspicious for early peritoneal metastatic disease. 3. No lymphadenopathy or other evidence of metastatic disease in the abdomen or pelvis. 4. Polyp or adherent gallstone near the gallbladder fundus, better assessed by prior ultrasound. 5. Large burden of stool throughout the colon and rectum.   Aortic Atherosclerosis (ICD10-I70.0).   12/03/2020 Pathology Results   SURGICAL PATHOLOGY  CASE: [id number]  PATIENT: Brooke Khan  Surgical Pathology Report   Clinical History: Ovarian mass (crm)    FINAL MICROSCOPIC DIAGNOSIS:   A. UTERUS, CERVIX, BILATERAL FALLOPIAN TUBES AND OVARIES:  Ovary, right  -  Endometrioid carcinoma, FIGO grade 3  -  See oncology table and comment below   Ovary, left:  -  No carcinoma identified   Bilateral Fallopian tubes:  -  No carcinoma identified   Uterus:  -  No carcinoma identified   Cervix:  -  No carcinoma identified   B. LYMPH NODES, RIGHT PARAAORTIC, RESECTION:  -  No carcinoma  identified in six lymph nodes (0/6)   C. LYMPH NODES, LEFT PARAAORTIC, RESECTION:  -  No carcinoma identified in five lymph nodes (0/5)   D. LYMPH NODES, RIGHT PELVIC, RESECTION:  -  No carcinoma identified in five lymph nodes (0/5)   E. LYMPH NODES, LEFT PELVIC, RESECTION:  -  No carcinoma identified in three lymph nodes (0/3)   F. OMENTUM, OMENTECTOMY:  -  No carcinoma identified   G. PERITONEUM, ANTERIOR PELVIC, BIOPSY:  -  No carcinoma identified   H. PERITONEUM, RIGHT PELVIC, BIOPSY:  -  No carcinoma identified   I. PERITONEUM, LEFT PELVIC, BIOPSY:  -  No carcinoma identified   J. PERITONEUM, POSTERIOR PELVIC, BIOPSY:  -  No carcinoma identified   K. PERITONEUM, RIGHT ABDOMINAL, BIOPSY:  -  No carcinoma identified   L. PERITONEUM, LEFT ABDOMINAL, BIOPSY:  -  No carcinoma identified    ONCOLOGY TABLE:   OVARY or FALLOPIAN TUBE or PRIMARY PERITONEUM: Resection   Procedure: Hysterectomy with bilateral salpingo-oophorectomy  Specimen Integrity: Intact  Tumor Site: Ovary, right  Tumor Size: 1.5 cm  Histologic Type: Endometrioid carcinoma  Histologic Grade: FIGO grade 3  Ovarian Surface Involvement: Not identified  Fallopian Tube Surface Involvement: Not identified  Other Tissue/ Organ Involvement: Not applicable  Largest Extrapelvic Peritoneal Focus: Not applicable  Peritoneal/Ascitic Fluid Involvement: Malignant cells not identified  Chemotherapy Response Score (CRS): Not applicable, no known presurgical therapy  Regional Lymph Nodes:       Number of Nodes with Metastasis Greater than 10 mm: 0  Number of Nodes with Metastasis 10 mm or Less (excludes isolated  tumor cells): 0       Number of Nodes with Isolated Tumor Cells (0.2 mm or less): 0       Number of Lymph Nodes Examined: 19  Distant Metastasis:       Distant Site(s) Involved: Not applicable  Pathologic Stage Classification (pTNM, AJCC 8th Edition): pT1a, pN0  Ancillary Studies: Can be performed  upon request  Representative Tumor Block: A8  Comment(s): Dr. Vic Ripper reviewed the case and agrees with the above diagnosis.     12/03/2020 Surgery   Surgeon: Donaciano Eva    Operation: Robotic-assisted laparoscopic total hysterectomy with bilateral salpingoophorectomy, omentectomy, pelvic and para-aortic lymphadnectomy.     Surgeon: Donaciano Eva    Operative Findings:  : 20cm cystic mass, smooth. Attachments between inferior aspect of cystic mass and posterior pelvic peritoneum. Normal appearing omentum and nodes. Invasive malignancy on evaluation. No gross extra-ovarian disease. Clinical stage I disease. No gross residual disease.    12/11/2020 Cancer Staging   Staging form: Ovary, Fallopian Tube, and Primary Peritoneal Carcinoma, AJCC 8th Edition - Pathologic stage from 12/11/2020: FIGO Stage IA (pT1a, pN0, cM0) - Signed by [name], MD on 12/11/2020 Stage prefix: Initial diagnosis   01/10/2021 Genetic Testing   Negative germline and somatic genetic testing: no pathogenic variants detected in [facility] MyRisk Panel.   No somatic variants detected in [facility]Choice HRD testing.  Genomic instaiblity score is negative.  The report dates are January 10, 2021.    The [facility] gene panel offered by [employer] includes sequencing and deletion/duplication testing of the following 48 genes: APC, ATM, AXIN2, BAP1, BARD1, BMPR1A, BRCA1, BRCA2, BRIP1, CHD1, CDK4, CDKN2A(p16 and p14ARF), CHEK2, CTNNA1, EGFR, EPCAM, FH, FLCN, GREM1, HOXB13, MEN1, MET, MITF , MLH1, MSH2, MSH3, MSH6, MUTYH, NTHL1, PALB2, PMS2, POLD1, POLE, PTEN, RAD51C, RAD51D, RET, SDHA, SDHB, SDHC, SDHD, SMAD4, STK11,TERT, TP53, TSC1, TSC2, and VHL.    01/15/2021 - 03/17/2021 Chemotherapy   Patient is on Treatment Plan : OVARIAN Carboplatin (AUC 6) / Paclitaxel (175) q21d x 6 cycles     04/15/2021 Tumor Marker   Patient's tumor was tested for the following markers: Ca-125. Results of the tumor marker test  revealed 7.7.   04/16/2021 Imaging   1. New left periaortic adenopathy and new left omental and mesenteric root nodules, indicative of metastatic disease. 2. Right lower lobe nodules are stable. 3. Hepatic steatosis. 4. Cholelithiasis. 5. Left renal stone. 6.  Aortic atherosclerosis (ICD10-I70.0).   06/17/2021 Imaging   1. Small RIGHT retroperitoneal nodule not significant changed. Recommend attention on follow-up. 2. Two low-density para-aortic lymph nodes unchanged from comparison exam. 3. No clear evidence omental or peritoneal metastasis. 4. Stable small pulmonary nodule in the RIGHT lower lobe.     06/19/2021 Tumor Marker   Patient's tumor was tested for the following markers: CA-125. Results of the tumor marker test revealed 6.3.   12/17/2021 Imaging   Stable small low-attenuation retroperitoneal lymph nodes in left para-aortic region. No new or progressive disease within the abdomen or pelvis.   Stable 6 mm polypoid density along the nondependent gallbladder wall consistent with gallbladder polyp. Recommend continued imaging follow-up with ultrasound in 6-12 months.   Stable tiny nonobstructing left renal calculus.   Mild increase in size of midline paraumbilical hernia, which contains only fat.   Aortic Atherosclerosis (ICD10-I70.0).   12/17/2021 Tumor Marker   Patient's tumor was tested for the following markers: CA-125.  Results of the tumor marker test revealed 6.8.   03/19/2022 Tumor Marker   Patient's tumor was tested for the following markers: CA-125. Results of the tumor marker test revealed 6.5.     Interval History: Given symptoms of bloating, abdominal pain new onset of constipation in the setting of CT scan negative for disease recurrence, the patient saw gastroenterology in August.  1 polyp was noted in the mid ascending colon which revealed tubular adenoma, no high-grade dysplasia or malignancy.  Started on prn Linzess and symptoms improved.  Reports today she  is overall doing well.  Denies any abdominal or pelvic pain.  Denies any vaginal bleeding or discharge.  Reports baseline bladder function.  Past Medical/Surgical History: Past Medical History:  Diagnosis Date   Bradycardia    Chicken pox    Elevated blood pressure reading    Epigastric abdominal pain    Family history of breast cancer 12/26/2020   Family history of prostate cancer 12/26/2020   Heart murmur    since childhood- not always heard   Hyperglycemia    Hyperlipidemia    borderline- no meds   Hypothyroidism (acquired)    Lightheaded    ovarian ca 11/2020   Pre-diabetes    Vitamin D deficiency     Past Surgical History:  Procedure Laterality Date   CESAREAN SECTION     x2   cosmetic knee surgery     as child   LAPAROTOMY N/A 12/03/2020   Procedure: MINI LAPAROTOMY FOR CYST DECOMPRESSION;  Surgeon: Everitt Amber, MD;  Location: [facility];  Service: Gynecology;  Laterality: N/A;   ROBOTIC ASSISTED TOTAL HYSTERECTOMY WITH BILATERAL SALPINGO OOPHERECTOMY N/A 12/03/2020   Procedure: XI ROBOTIC ASSISTED TOTAL HYSTERECTOMY WITH BILATERAL SALPINGO OOPHORECTOMY WITH PARA-AORTIC AND PELVIC LYMPHADNECTOMY AND OMENTECTOMY;  Surgeon: Everitt Amber, MD;  Location: [facility];  Service: Gynecology;  Laterality: N/A;    Family History  Problem Relation Age of Onset   Elevated Lipids Mother    Prostate cancer Father        dx 64s   Elevated Lipids Father    Heart disease Father        mid 42s. had been remote smoker.    Hypertension Father    Diabetes Father        mid 21s   Dementia Father        vascular based   Skin cancer Father        SCC, dx after 33   Healthy Brother    Breast cancer Maternal Aunt 63   Prostate cancer Maternal Uncle        dx 71s   Lymphoma Paternal Uncle        dx after 69   Diabetes Maternal Grandmother    Arthritis Maternal Grandfather    Diabetes Paternal Grandfather    Cancer Cousin        unknown type; ?throat ?lymphoma d. 65   Colon cancer Neg Hx     Colon polyps Neg Hx    Esophageal cancer Neg Hx    Rectal cancer Neg Hx    Stomach cancer Neg Hx     Social History   Socioeconomic History   Marital status: Married    Spouse name: Not on file   Number of children: 2   Years of education: Not on file   Highest education level: Not on file  Occupational History   Occupation: realtor  Tobacco Use   Smoking status: Never   Smokeless tobacco: Never  Vaping Use   Vaping Use: Never used  Substance and Sexual Activity   Alcohol use: Yes    Comment: rare   Drug use: No   Sexual activity: Yes    Partners: Male    Comment: husband  Other Topics Concern   Not on file  Social History Narrative   Married 30 years October 2020. Daughter went to [place]. Son went to [place]. In 2020, daughter Corporate treasurer in [place], son moving to [place] and to work for [employer] in Engineer, mining. Has a Biomedical scientist in [place].       Hobbies: tennis, plays mahjong in womens group      Social Determinants of Health   Financial Resource Strain: Not on file  Food Insecurity: Not on file  Transportation Needs: Not on file  Physical Activity: Not on file  Stress: Not on file  Social Connections: Not on file    Current Medications:  Current Outpatient Medications:    amLODipine (NORVASC) 5 MG tablet, Take 1 tablet (5 mg total) by mouth daily., Disp: 90 tablet, Rfl: 2   calcium carbonate (TUMS - DOSED IN MG ELEMENTAL CALCIUM) 500 MG chewable tablet, Chew 1 tablet by mouth daily., Disp: , Rfl:    Cholecalciferol (VITAMIN D3) 1.25 MG (50000 UT) CAPS, Vitamin D3  5,000 iu qd, Disp: , Rfl:    levothyroxine (SYNTHROID) 50 MCG tablet, TAKE 1 TABLET BY MOUTH EVERY DAY, Disp: 90 tablet, Rfl: 3   linaclotide (LINZESS) 145 MCG CAPS capsule, Take 1 capsule (145 mcg total) by mouth daily before breakfast., Disp: 90 capsule, Rfl: 1   OVER THE COUNTER MEDICATION, Smooth move tea daily, Disp: , Rfl:    rosuvastatin (CRESTOR) 5 MG  tablet, Take 1 tablet (5 mg total) by mouth daily., Disp: 90 tablet, Rfl: 0   vitamin B-12 (CYANOCOBALAMIN) 1000 MCG tablet, Take 1,000 mcg by mouth in the morning., Disp: , Rfl:    vitamin C (ASCORBIC ACID) 500 MG tablet, Take 500 mg by mouth in the morning., Disp: , Rfl:   Review of Systems: Denies appetite changes, fevers, chills, fatigue, unexplained weight changes. Denies hearing loss, neck lumps or masses, mouth sores, ringing in ears or voice changes. Denies cough or wheezing.  Denies shortness of breath. Denies chest pain or palpitations. Denies leg swelling. Denies abdominal distention, pain, blood in stools, constipation, diarrhea, nausea, vomiting, or early satiety. Denies pain with intercourse, dysuria, frequency, hematuria or incontinence. Denies hot flashes, pelvic pain, vaginal bleeding or vaginal discharge.   Denies joint pain, back pain or muscle pain/cramps. Denies itching, rash, or wounds. Denies dizziness, headaches, numbness or seizures. Denies swollen lymph nodes or glands, denies easy bruising or bleeding. Denies anxiety, depression, confusion, or decreased concentration.  Physical Exam: BP 126/65 (BP Location: Right Arm, Patient Position: Sitting)   Pulse 65   Temp 98.4 F (36.9 C) (Oral)   Resp 18   Ht 5' 3.19" (1.605 m)   Wt 174 lb 6.4 oz (79.1 kg)   LMP 05/11/2014   SpO2 100%   BMI 30.71 kg/m  General: Alert, oriented, no acute distress. HEENT: Cephalic, atraumatic, sclera anicteric. Chest: Clear to auscultation bilaterally.  No wheezes or rhonchi. Cardiovascular: Regular rate and rhythm, no murmurs. Abdomen: soft, nontender.  Normoactive bowel sounds.  No masses or hepatosplenomegaly appreciated.  Well-healed incisions. Extremities: Grossly normal range of motion.  Warm, well perfused.  No edema bilaterally. Skin: No rashes or lesions noted. Lymphatics: No cervical, supraclavicular,  or inguinal adenopathy. GU: Normal appearing external genitalia  without erythema, excoriation, or lesions.  Speculum exam reveals mildly atrophic vaginal mucosa, polypoid beefy red <1 cm lesion noted along the cuff in the midline into the right (previously biopsied).  No bleeding or discharge.  Bimanual exam reveals cuff intact, lesion described above is palpable although not firm or nodular.  No nodularity or masses.  Rectovaginal exam confirms these findings.  Laboratory & Radiologic Studies: Component Ref Range & Units 6 d ago 3 mo ago 6 mo ago 9 mo ago 11 mo ago  Cancer Antigen (CA) 125 0.0 - 38.1 U/mL 6.5 6.8 CM 6.3 CM 6.3 CM 7.7 CM   CT A/P on 12/16/21: IMPRESSION: Stable small low-attenuation retroperitoneal lymph nodes in left para-aortic region. No new or progressive disease within the abdomen or pelvis. Stable 6 mm polypoid density along the nondependent gallbladder wall consistent with gallbladder polyp. Recommend continued imaging follow-up with ultrasound in 6-12 months. Stable tiny nonobstructing left renal calculus. Mild increase in size of midline paraumbilical hernia, which contains only fat. Aortic Atherosclerosis (ICD10-I70.0).  Assessment & Plan: Laportia Carley is a 56 y.o. 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  Completed 4 cycles of adjuvant chemotherapy. Negative genetic testing.   Patient is overall doing well and is NED on exam today.  She recently had a CA125 drawn which was normal.  Her bloating and constipation symptoms have improved with the use of as needed Linzess.  Reviewed most recent CT scan from August.  No evidence of new or progressive disease.  She continues to have a stable gallbladder polyp.  We discussed that routine imaging surveillance is not recommended in the setting of ovarian cancer.  Given her abnormal imaging after surgery, I have suggested that we repeat a CT scan next May when I see her.  If this remains stable, then I  think we can move to imaging directed by change in symptoms or elevation of her Ca1 25.   We will continue with visits every 3 months.  The patient sees Dr. Alvy Bimler in February.  I will see her back in May.   22 minutes of total time was spent for this patient encounter, including preparation, face-to-face counseling with the patient and coordination of care, and documentation of the encounter.  Jeral Pinch, MD  Division of Gynecologic Oncology  Department of Obstetrics and Gynecology  [facility]

## 2022-04-07 NOTE — Progress Notes (Deleted)
Cardiology Office Note:    Date:  04/07/2022   ID:  Brooke Khan, DOB May 26, 1965, MRN [id number]  PCP:  [name], MD   [facility] Providers Cardiologist:  None {    Referring MD: [name], MD     History of Present Illness:    Brooke Khan is a 56 y.o. female with a hx of HLD, hypothyroidism, and ovarian cancer on chemo with carboplatin/paclitaxel who presents to clinic for follow-up.  Patient initially seen in clinic on 05/2021 for episodes of low HR with sleeping. No other OSA symptoms. Cardiac monitor 11/2020 with nocturnal bradycardia but no pauses or significant arrhythmias. TTE 05/2021 with LVEF 60-65%, normal RV, mild MR.  Today, ***  Past Medical History:  Diagnosis Date   Bradycardia    Chicken pox    Elevated blood pressure reading    Epigastric abdominal pain    Family history of breast cancer 12/26/2020   Family history of prostate cancer 12/26/2020   Heart murmur    since childhood- not always heard   Hyperglycemia    Hyperlipidemia    borderline- no meds   Hypothyroidism (acquired)    Lightheaded    ovarian ca 11/2020   Pre-diabetes    Vitamin D deficiency     Past Surgical History:  Procedure Laterality Date   CESAREAN SECTION     x2   cosmetic knee surgery     as child   LAPAROTOMY N/A 12/03/2020   Procedure: MINI LAPAROTOMY FOR CYST DECOMPRESSION;  Surgeon: Everitt Amber, MD;  Location: [facility];  Service: Gynecology;  Laterality: N/A;   ROBOTIC ASSISTED TOTAL HYSTERECTOMY WITH BILATERAL SALPINGO OOPHERECTOMY N/A 12/03/2020   Procedure: XI ROBOTIC ASSISTED TOTAL HYSTERECTOMY WITH BILATERAL SALPINGO OOPHORECTOMY WITH PARA-AORTIC AND PELVIC LYMPHADNECTOMY AND OMENTECTOMY;  Surgeon: Everitt Amber, MD;  Location: [facility];  Service: Gynecology;  Laterality: N/A;    Current Medications: No outpatient medications have been marked as taking for the 04/08/22 encounter (Appointment) with Freada Bergeron, MD.      Allergies:   Codeine   Social History   Socioeconomic History   Marital status: Married    Spouse name: Not on file   Number of children: 2   Years of education: Not on file   Highest education level: Not on file  Occupational History   Occupation: realtor  Tobacco Use   Smoking status: Never   Smokeless tobacco: Never  Vaping Use   Vaping Use: Never used  Substance and Sexual Activity   Alcohol use: Yes    Comment: rare   Drug use: No   Sexual activity: Yes    Partners: Male    Comment: husband  Other Topics Concern   Not on file  Social History Narrative   Married 30 years October 2020. Daughter went to [place]. Son went to [place]. In 2020, daughter Corporate treasurer in [place], son moving to [place] and to work for [employer] in Engineer, mining. 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       Hobbies: tennis, plays mahjong in womens group      Social Determinants of Health   Financial Resource Strain: Not on file  Food Insecurity: Not on file  Transportation Needs: Not on file  Physical Activity: Not on file  Stress: Not on file  Social Connections: Not on file     Family History: The patient's family history includes Arthritis in her maternal grandfather; Breast cancer (age  of onset: 60) in her maternal aunt; Cancer in her cousin; Dementia in her father; Diabetes in her father, maternal grandmother, and paternal grandfather; Elevated Lipids in her father and mother; Healthy in her brother; Heart disease in her father; Hypertension in her father; Lymphoma in her paternal uncle; Prostate cancer in her father and maternal uncle; Skin cancer in her father. There is no history of Colon cancer, Colon polyps, Esophageal cancer, Rectal cancer, or Stomach cancer.  ROS:   Please see the history of present illness.    Review of Systems  Constitutional:  Negative for malaise/fatigue.  Respiratory:  Negative for shortness of breath.   Cardiovascular:  Negative  for chest pain, palpitations, orthopnea, claudication, leg swelling and PND.  Gastrointestinal:  Negative for blood in stool and melena.  Genitourinary:  Negative for hematuria.  Neurological:  Negative for dizziness and loss of consciousness.     EKGs/Labs/Other Studies Reviewed:    The following studies were reviewed today: TTE 2021-05-28: IMPRESSIONS     1. Left ventricular ejection fraction, by estimation, is 60 to 65%. The  left ventricle has normal function. The left ventricle has no regional  wall motion abnormalities. There is mild left ventricular hypertrophy.  Left ventricular diastolic parameters  were normal.   2. Right ventricular systolic function is normal. The right ventricular  size is normal.   3. The mitral valve is normal in structure. Mild mitral valve  regurgitation.   4. The aortic valve is tricuspid. Aortic valve regurgitation is not  visualized. Aortic valve sclerosis is present, with no evidence of aortic  valve stenosis.   Cardiac Monitor 11/2020: Patch Wear Time:  5 days and 17 hours (2022-07-07T12:52:11-398 to 2022-07-13T06:04:12-398)   Patient had a min HR of 31 bpm, max HR of 102 bpm, and avg HR of 54 bpm. Predominant underlying rhythm was Sinus Rhythm. Isolated SVEs were rare (<1.0%), SVE Couplets were rare (<1.0%), and SVE Triplets were rare (<1.0%). Isolated VEs were rare (<1.0%),  and no VE Couplets or VE Triplets were present.    Nocturnal bradycardia present to a heart rate of 30 7:25 AM/1:15 AM/5:55 AM Occasional ventricular ectopics and supraventricular ectopics   No significant arrhythmias  EKG:  EKG is  ordered today.  The ekg ordered today demonstrates NSR with HR 70  Recent Labs: 12/30/2021: TSH 1.73 03/18/2022: ALT 26; BUN 22; Creatinine 0.97; Hemoglobin 13.3; Platelets 275; Potassium 4.0; Sodium 146  Recent Lipid Panel    Component Value Date/Time   CHOL 122 06/30/2021 0839   TRIG 85 06/30/2021 0839   HDL 45 06/30/2021 0839    CHOLHDL 2.7 06/30/2021 0839   CHOLHDL 5 09/07/2018 1025   VLDL 24.2 09/07/2018 1025   LDLCALC 60 06/30/2021 0839   LDLDIRECT 118.0 03/14/2015 0832     Risk Assessment/Calculations:           Physical Exam:    VS:  LMP 05/11/2014     Wt Readings from Last 3 Encounters:  03/24/22 174 lb 6.4 oz (79.1 kg)  02/12/22 177 lb (80.3 kg)  12/30/21 177 lb (80.3 kg)     GEN:  Well nourished, well developed in no acute distress HEENT: Normal NECK: No JVD; No carotid bruits CARDIAC: RRR, no murmurs, rubs, gallops RESPIRATORY:  Clear to auscultation without rales, wheezing or rhonchi  ABDOMEN: Soft, non-tender, non-distended MUSCULOSKELETAL:  No edema; No deformity  SKIN: Warm and dry NEUROLOGIC:  Alert and oriented x 3 PSYCHIATRIC:  Normal affect   ASSESSMENT:  No diagnosis found.  PLAN:    In order of problems listed above:  #Nocturnal Bradycardia: Patient with episodes of HR 30-40s while at night. No known OSA and patient states her husband says she does not snore or have apneic episodes that he knows of.  Overall, she feels well and exercises without symptoms. No daytime somnolence or fatigue. Not on nodal agents. Discussed that we can do a sleep study to assess for OSA but she would like to defer at this time as she is currently undergoing management for ovarian cancer. She is going to work on weight loss. Given lack of symptoms and appropriate augmentation of HR with activity, no further work-up needed at this time. -Declined sleep study -No nodal agents -Patient asymptomatic, will continue to monitor  #HLD #Aortic Atherosclerosis: LDL at goal 60. -Continue crestor '5mg'$  daily  #HTN: *** -Continue amlodipine '5mg'$  daily  #Ovarian Cancer: S/p resection and chemo with carboplatin/paclitaxel. Doing well. Follows with Dr. Alvy Bimler.  #Obesity: BMI 31.6. Patient is working on weight loss and is very motivated to lose weight and eat healthy.  Exercise  recommendations: Goal of exercising for at least 30 minutes a day, at least 5 times per week.  Please exercise to a moderate exertion.  This means that while exercising it is difficult to speak in full sentences, however you are not so short of breath that you feel you must stop, and not so comfortable that you can carry on a full conversation.  Exertion level should be approximately a 5/10, if 10 is the most exertion you can perform.  Diet recommendations: Recommend a heart healthy diet such as the Mediterranean diet.  This diet consists of plant based foods, healthy fats, lean meats, olive oil.  It suggests limiting the intake of simple carbohydrates such as white breads, pastries, and pastas.  It also limits the amount of red meat, wine, and dairy products such as cheese that one should consume on a daily basis.        Medication Adjustments/Labs and Tests Ordered: Current medicines are reviewed at length with the patient today.  Concerns regarding medicines are outlined above.  No orders of the defined types were placed in this encounter.  No orders of the defined types were placed in this encounter.   There are no Patient Instructions on file for this visit.    Signed, Freada Bergeron, MD  04/07/2022 1:23 PM    [place]

## 2022-04-08 ENCOUNTER — Ambulatory Visit: Payer: BC Managed Care – PPO | Admitting: Cardiology

## 2022-04-08 NOTE — Progress Notes (Unsigned)
Cardiology Office Note:    Date:  04/09/2022   ID:  Tangia, Pinard 1965-10-18, MRN [id number]  PCP:  [name], MD   [facility] Providers Cardiologist:  None {    Referring MD: [name], MD     History of Present Illness:    Gerald Honea is a 56 y.o. female with a hx of HLD, hypothyroidism, and ovarian cancer on chemo with carboplatin/paclitaxel who presents to clinic for follow-up.  Patient initially seen in clinic on 05/2021 for episodes of low HR with sleeping. No other OSA symptoms. Cardiac monitor 11/2020 with nocturnal bradycardia but no pauses or significant arrhythmias. TTE 05/2021 with LVEF 60-65%, normal RV, mild MR.  Today, the patient overall feels well. States that her heart rates have not dropped as low at night as she previously noted on her apple watch. Otherwise, she is doing very well from a CV standpoint with no chest pain, SOB, lightheadedness or dizziness.   Of note, her BP has been elevated in the morning on her home cuff but it is always well controlled in MD office. Wondering if her BP cuff is off.   Past Medical History:  Diagnosis Date   Bradycardia    Chicken pox    Elevated blood pressure reading    Epigastric abdominal pain    Family history of breast cancer 12/26/2020   Family history of prostate cancer 12/26/2020   Heart murmur    since childhood- not always heard   Hyperglycemia    Hyperlipidemia    borderline- no meds   Hypothyroidism (acquired)    Lightheaded    ovarian ca 11/2020   Pre-diabetes    Vitamin D deficiency     Past Surgical History:  Procedure Laterality Date   CESAREAN SECTION     x2   cosmetic knee surgery     as child   LAPAROTOMY N/A 12/03/2020   Procedure: MINI LAPAROTOMY FOR CYST DECOMPRESSION;  Surgeon: Everitt Amber, MD;  Location: [facility];  Service: Gynecology;  Laterality: N/A;   ROBOTIC ASSISTED TOTAL HYSTERECTOMY WITH BILATERAL SALPINGO OOPHERECTOMY N/A 12/03/2020    Procedure: XI ROBOTIC ASSISTED TOTAL HYSTERECTOMY WITH BILATERAL SALPINGO OOPHORECTOMY WITH PARA-AORTIC AND PELVIC LYMPHADNECTOMY AND OMENTECTOMY;  Surgeon: Everitt Amber, MD;  Location: [facility];  Service: Gynecology;  Laterality: N/A;    Current Medications: Current Meds  Medication Sig   amLODipine (NORVASC) 5 MG tablet Take 1 tablet (5 mg total) by mouth daily.   calcium carbonate (TUMS - DOSED IN MG ELEMENTAL CALCIUM) 500 MG chewable tablet Chew 1 tablet by mouth daily.   Cholecalciferol (VITAMIN D3) 1.25 MG (50000 UT) CAPS Vitamin D3  5,000 iu qd   levothyroxine (SYNTHROID) 50 MCG tablet TAKE 1 TABLET BY MOUTH EVERY DAY   linaclotide (LINZESS) 145 MCG CAPS capsule Take 1 capsule (145 mcg total) by mouth daily before breakfast. (Patient taking differently: Take 145 mcg by mouth as needed.)   OVER THE COUNTER MEDICATION as needed. Smooth move tea daily   rosuvastatin (CRESTOR) 5 MG tablet Take 1 tablet (5 mg total) by mouth daily.   vitamin B-12 (CYANOCOBALAMIN) 1000 MCG tablet Take 1,000 mcg by mouth in the morning.   vitamin C (ASCORBIC ACID) 500 MG tablet Take 500 mg by mouth in the morning.     Allergies:   Codeine   Social History   Socioeconomic History   Marital status: Married    Spouse name: Not on file   Number of  children: 2   Years of education: Not on file   Highest education level: Not on file  Occupational History   Occupation: realtor  Tobacco Use   Smoking status: Never   Smokeless tobacco: Never  Vaping Use   Vaping Use: Never used  Substance and Sexual Activity   Alcohol use: Yes    Comment: rare   Drug use: No   Sexual activity: Yes    Partners: Male    Comment: husband  Other Topics Concern   Not on file  Social History Narrative   Married 30 years October 2020. Daughter went to [place]. Son went to [employer]. 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       Hobbies: tennis, plays mahjong in womens group      Social Determinants of Health   Financial Resource Strain: Not on file  Food Insecurity: Not on file  Transportation Needs: Not on file  Physical Activity: Not on file  Stress: Not on file  Social Connections: Not on file     Family History: The patient's family history includes Arthritis in her maternal grandfather; Breast cancer (age of onset: 75) in her maternal aunt; Cancer in her cousin; Dementia in her father; Diabetes in her father, maternal grandmother, and paternal grandfather; Elevated Lipids in her father and mother; Healthy in her brother; Heart disease in her father; Hypertension in her father; Lymphoma in her paternal uncle; Prostate cancer in her father and maternal uncle; Skin cancer in her father. There is no history of Colon cancer, Colon polyps, Esophageal cancer, Rectal cancer, or Stomach cancer.  ROS:   Please see the history of present illness.    Review of Systems  Constitutional:  Negative for malaise/fatigue.  HENT:  Negative for congestion.   Respiratory:  Negative for shortness of breath.   Cardiovascular:  Negative for chest pain, palpitations, orthopnea, claudication, leg swelling and PND.  Gastrointestinal:  Negative for blood in stool, melena, nausea and vomiting.  Genitourinary:  Negative for hematuria.  Neurological:  Negative for dizziness and loss of consciousness.     EKGs/Labs/Other Studies Reviewed:    The following studies were reviewed today: TTE 28-May-2021: IMPRESSIONS     1. Left ventricular ejection fraction, by estimation, is 60 to 65%. The  left ventricle has normal function. The left ventricle has no regional  wall motion abnormalities. There is mild left ventricular hypertrophy.  Left ventricular diastolic parameters  were normal.   2. Right ventricular systolic function is normal. The right ventricular  size is normal.   3. The mitral valve is  normal in structure. Mild mitral valve  regurgitation.   4. The aortic valve is tricuspid. Aortic valve regurgitation is not  visualized. Aortic valve sclerosis is present, with no evidence of aortic  valve stenosis.   Cardiac Monitor 11/2020: Patch Wear Time:  5 days and 17 hours (2022-07-07T12:52:11-398 to 2022-07-13T06:04:12-398)   Patient had a min HR of 31 bpm, max HR of 102 bpm, and avg HR of 54 bpm. Predominant underlying rhythm was Sinus Rhythm. Isolated SVEs were rare (<1.0%), SVE Couplets were rare (<1.0%), and SVE Triplets were rare (<1.0%). Isolated VEs were rare (<1.0%),  and no VE Couplets or VE Triplets were present.    Nocturnal bradycardia present to a heart rate of 30 7:25 AM/1:15 AM/5:55 AM Occasional ventricular ectopics and supraventricular  ectopics   No significant arrhythmias  EKG:  EKG is  ordered today.  The ekg ordered today demonstrates NSR with HR 70  Recent Labs: 12/30/2021: TSH 1.73 03/18/2022: ALT 26; BUN 22; Creatinine 0.97; Hemoglobin 13.3; Platelets 275; Potassium 4.0; Sodium 146  Recent Lipid Panel    Component Value Date/Time   CHOL 122 06/30/2021 0839   TRIG 85 06/30/2021 0839   HDL 45 06/30/2021 0839   CHOLHDL 2.7 06/30/2021 0839   CHOLHDL 5 09/07/2018 1025   VLDL 24.2 09/07/2018 1025   LDLCALC 60 06/30/2021 0839   LDLDIRECT 118.0 03/14/2015 0832     Risk Assessment/Calculations:           Physical Exam:    VS:  BP 116/78   Pulse (!) 50   Ht 5' 2.5" (1.588 m)   Wt 178 lb (80.7 kg)   LMP 05/11/2014   SpO2 96%   BMI 32.04 kg/m     Wt Readings from Last 3 Encounters:  04/09/22 178 lb (80.7 kg)  03/24/22 174 lb 6.4 oz (79.1 kg)  02/12/22 177 lb (80.3 kg)     GEN:  Comfortable, NAD HEENT: Normal NECK: No JVD; No carotid bruits CARDIAC: RRR, no murmurs, rubs, gallops RESPIRATORY:  Clear to auscultation without rales, wheezing or rhonchi  ABDOMEN: Soft, non-tender, non-distended MUSCULOSKELETAL:  No edema; No deformity   SKIN: Warm and dry NEUROLOGIC:  Alert and oriented x 3 PSYCHIATRIC:  Normal affect   ASSESSMENT:    1. Bradycardia   2. Aortic atherosclerosis ([place])   3. Primary hypertension   4. Obesity (BMI 30.0-34.9)   5. Pure hypercholesterolemia    PLAN:    In order of problems listed above:  #Nocturnal Bradycardia: Patient with episodes of HR 30-40s while at night. No known OSA and no history of snoring or apnea.  Overall, she feels well and exercises without symptoms. No daytime somnolence or fatigue. Not on nodal agents. Given lack of symptoms and appropriate augmentation of HR with activity, no further work-up needed at this time. -Declined sleep study -No nodal agents -Patient asymptomatic, will continue to monitor  #HLD #Aortic Atherosclerosis: LDL at goal 60. -Continue crestor '5mg'$  daily  #HTN: Well controlled in MD office. Concerned her cuff may be off. She is going to compare to her mother's BP cuff and let us know. Can always bring to clinic to ensure BP cuff correlates -Continue amlodipine '5mg'$  daily  #Mild MR: TTE 05/2021 with mild MR. Will continue with serial monitoring.  #Ovarian Cancer: S/p resection and chemo with carboplatin/paclitaxel. Doing well. Follows with Dr. Alvy Bimler.  #Obesity: BMI 32.. Patient is working on weight loss and is very motivated to lose weight and eat healthy.  Exercise recommendations: Goal of exercising for at least 30 minutes a day, at least 5 times per week.  Please exercise to a moderate exertion.  This means that while exercising it is difficult to speak in full sentences, however you are not so short of breath that you feel you must stop, and not so comfortable that you can carry on a full conversation.  Exertion level should be approximately a 5/10, if 10 is the most exertion you can perform.  Diet recommendations: Recommend a heart healthy diet such as the Mediterranean diet.  This diet consists of plant based foods, healthy fats, lean  meats, olive oil.  It suggests limiting the intake of simple carbohydrates such as white breads, pastries, and pastas.  It also limits the amount of red meat,  wine, and dairy products such as cheese that one should consume on a daily basis.        Medication Adjustments/Labs and Tests Ordered: Current medicines are reviewed at length with the patient today.  Concerns regarding medicines are outlined above.  No orders of the defined types were placed in this encounter.  No orders of the defined types were placed in this encounter.   Patient Instructions  Medication Instructions:  NO CHANGES *If you need a refill on your cardiac medications before your next appointment, please call your pharmacy*   Lab Work: NONE If you have labs (blood work) drawn today and your tests are completely normal, you will receive your results only by: [place] (if you have MyChart) OR A paper copy in the mail If you have any lab test that is abnormal or we need to change your treatment, we will call you to review the results.   Testing/Procedures: NONE   Follow-Up: At [facility], you and your health needs are our priority.  As part of our continuing mission to provide you with exceptional heart care, we have created designated Provider Care Teams.  These Care Teams include your primary Cardiologist (physician) and Advanced Practice Providers (APPs -  Physician Assistants and Nurse Practitioners) who all work together to provide you with the care you need, when you need it.  We recommend signing up for the patient portal called "MyChart".  Sign up information is provided on this After Visit Summary.  MyChart is used to connect with patients for Virtual Visits (Telemedicine).  Patients are able to view lab/test results, encounter notes, upcoming appointments, etc.  Non-urgent messages can be sent to your provider as well.   To learn more about what you can do with MyChart, go to  [email or web address].    Your next appointment:   1 year(s)  The format for your next appointment:   In Person  Provider:  DR [facility]     Other Instructions NONE  Important Information About Sugar          Signed, Freada Bergeron, MD  04/09/2022 1:01 PM    [place]

## 2022-04-09 ENCOUNTER — Ambulatory Visit: Payer: BC Managed Care – PPO | Attending: Cardiology | Admitting: Cardiology

## 2022-04-09 VITALS — BP 116/78 | HR 50 | Ht 62.5 in | Wt 178.0 lb

## 2022-04-09 DIAGNOSIS — E669 Obesity, unspecified: Secondary | ICD-10-CM | POA: Diagnosis not present

## 2022-04-09 DIAGNOSIS — R001 Bradycardia, unspecified: Secondary | ICD-10-CM | POA: Diagnosis not present

## 2022-04-09 DIAGNOSIS — I10 Essential (primary) hypertension: Secondary | ICD-10-CM | POA: Diagnosis not present

## 2022-04-09 DIAGNOSIS — E66811 Obesity, class 1: Secondary | ICD-10-CM

## 2022-04-09 DIAGNOSIS — E7800 Pure hypercholesterolemia, unspecified: Secondary | ICD-10-CM

## 2022-04-09 DIAGNOSIS — I700 Atherosclerosis of aorta: Secondary | ICD-10-CM

## 2022-04-09 NOTE — Patient Instructions (Signed)
Medication Instructions:  NO CHANGES *If you need a refill on your cardiac medications before your next appointment, please call your pharmacy*   Lab Work: NONE If you have labs (blood work) drawn today and your tests are completely normal, you will receive your results only by: [place] (if you have MyChart) OR A paper copy in the mail If you have any lab test that is abnormal or we need to change your treatment, we will call you to review the results.   Testing/Procedures: NONE   Follow-Up: At [facility], you and your health needs are our priority.  As part of our continuing mission to provide you with exceptional heart care, we have created designated Provider Care Teams.  These Care Teams include your primary Cardiologist (physician) and Advanced Practice Providers (APPs -  Physician Assistants and Nurse Practitioners) who all work together to provide you with the care you need, when you need it.  We recommend signing up for the patient portal called "MyChart".  Sign up information is provided on this After Visit Summary.  MyChart is used to connect with patients for Virtual Visits (Telemedicine).  Patients are able to view lab/test results, encounter notes, upcoming appointments, etc.  Non-urgent messages can be sent to your provider as well.   To learn more about what you can do with MyChart, go to [email or web address].    Your next appointment:   1 year(s)  The format for your next appointment:   In Person  Provider:  DR Johney Frame     Other Instructions NONE  Important Information About Sugar

## 2022-04-23 ENCOUNTER — Encounter: Payer: Self-pay | Admitting: *Deleted

## 2022-05-18 ENCOUNTER — Other Ambulatory Visit: Payer: Self-pay

## 2022-05-18 DIAGNOSIS — Z9221 Personal history of antineoplastic chemotherapy: Secondary | ICD-10-CM

## 2022-05-18 DIAGNOSIS — E785 Hyperlipidemia, unspecified: Secondary | ICD-10-CM

## 2022-05-18 DIAGNOSIS — Z79899 Other long term (current) drug therapy: Secondary | ICD-10-CM

## 2022-05-18 MED ORDER — ROSUVASTATIN CALCIUM 5 MG PO TABS: 5.0000 mg | ORAL_TABLET | Freq: Every day | ORAL | 3 refills | Status: DC

## 2022-05-25 DIAGNOSIS — Z1231 Encounter for screening mammogram for malignant neoplasm of breast: Secondary | ICD-10-CM | POA: Diagnosis not present

## 2022-05-25 DIAGNOSIS — Z13820 Encounter for screening for osteoporosis: Secondary | ICD-10-CM | POA: Diagnosis not present

## 2022-05-25 LAB — HM MAMMOGRAPHY

## 2022-05-27 ENCOUNTER — Other Ambulatory Visit: Payer: Self-pay | Admitting: Obstetrics and Gynecology

## 2022-05-27 DIAGNOSIS — R928 Other abnormal and inconclusive findings on diagnostic imaging of breast: Secondary | ICD-10-CM

## 2022-06-03 ENCOUNTER — Ambulatory Visit
Admission: RE | Admit: 2022-06-03 | Discharge: 2022-06-03 | Disposition: A | Discharge status: Home / Self Care | Payer: BC Managed Care – PPO | Source: Ambulatory Visit | Attending: Obstetrics and Gynecology | Admitting: Obstetrics and Gynecology

## 2022-06-03 DIAGNOSIS — R922 Inconclusive mammogram: Secondary | ICD-10-CM | POA: Diagnosis not present

## 2022-06-03 DIAGNOSIS — R928 Other abnormal and inconclusive findings on diagnostic imaging of breast: Secondary | ICD-10-CM

## 2022-06-03 DIAGNOSIS — N6489 Other specified disorders of breast: Secondary | ICD-10-CM | POA: Diagnosis not present

## 2022-06-19 ENCOUNTER — Other Ambulatory Visit: Payer: Self-pay

## 2022-06-19 ENCOUNTER — Inpatient Hospital Stay: Payer: BC Managed Care – PPO | Attending: Gynecologic Oncology

## 2022-06-19 DIAGNOSIS — Z8543 Personal history of malignant neoplasm of ovary: Secondary | ICD-10-CM | POA: Diagnosis not present

## 2022-06-19 DIAGNOSIS — C561 Malignant neoplasm of right ovary: Secondary | ICD-10-CM

## 2022-06-19 DIAGNOSIS — Z9221 Personal history of antineoplastic chemotherapy: Secondary | ICD-10-CM | POA: Insufficient documentation

## 2022-06-19 DIAGNOSIS — Z79899 Other long term (current) drug therapy: Secondary | ICD-10-CM | POA: Diagnosis not present

## 2022-06-19 DIAGNOSIS — I700 Atherosclerosis of aorta: Secondary | ICD-10-CM | POA: Insufficient documentation

## 2022-06-19 DIAGNOSIS — K828 Other specified diseases of gallbladder: Secondary | ICD-10-CM | POA: Diagnosis not present

## 2022-06-19 LAB — CBC WITH DIFFERENTIAL/PLATELET
Abs Immature Granulocytes: 0.01 10*3/uL (ref 0.00–0.07)
Basophils Absolute: 0 10*3/uL (ref 0.0–0.1)
Basophils Relative: 1 %
Eosinophils Absolute: 0.2 10*3/uL (ref 0.0–0.5)
Eosinophils Relative: 3 %
HCT: 40 % (ref 36.0–46.0)
Hemoglobin: 13.6 g/dL (ref 12.0–15.0)
Immature Granulocytes: 0 %
Lymphocytes Relative: 26 %
Lymphs Abs: 1.5 10*3/uL (ref 0.7–4.0)
MCH: 31.2 pg (ref 26.0–34.0)
MCHC: 34 g/dL (ref 30.0–36.0)
MCV: 91.7 fL (ref 80.0–100.0)
Monocytes Absolute: 0.5 10*3/uL (ref 0.1–1.0)
Monocytes Relative: 8 %
Neutro Abs: 3.8 10*3/uL (ref 1.7–7.7)
Neutrophils Relative %: 62 %
Platelets: 315 10*3/uL (ref 150–400)
RBC: 4.36 MIL/uL (ref 3.87–5.11)
RDW: 12.8 % (ref 11.5–15.5)
WBC: 6 10*3/uL (ref 4.0–10.5)
nRBC: 0 % (ref 0.0–0.2)

## 2022-06-19 LAB — COMPREHENSIVE METABOLIC PANEL
ALT: 28 U/L (ref 0–44)
AST: 19 U/L (ref 15–41)
Albumin: 4.3 g/dL (ref 3.5–5.0)
Alkaline Phosphatase: 79 U/L (ref 38–126)
Anion gap: 7 (ref 5–15)
BUN: 15 mg/dL (ref 6–20)
CO2: 28 mmol/L (ref 22–32)
Calcium: 10 mg/dL (ref 8.9–10.3)
Chloride: 108 mmol/L (ref 98–111)
Creatinine, Ser: 0.8 mg/dL (ref 0.44–1.00)
GFR, Estimated: >60 mL/min (ref >60)
Glucose, Bld: 110 mg/dL — ABNORMAL HIGH (ref 70–99)
Potassium: 4.5 mmol/L (ref 3.5–5.1)
Sodium: 143 mmol/L (ref 135–145)
Total Bilirubin: 0.4 mg/dL (ref 0.3–1.2)
Total Protein: 7.3 g/dL (ref 6.5–8.1)

## 2022-06-20 LAB — CA 125: Cancer Antigen (CA) 125: 6.3 U/mL (ref 0.0–38.1)

## 2022-06-23 ENCOUNTER — Encounter: Payer: Self-pay | Admitting: Hematology and Oncology

## 2022-06-23 ENCOUNTER — Inpatient Hospital Stay: Payer: BC Managed Care – PPO | Admitting: Hematology and Oncology

## 2022-06-23 ENCOUNTER — Other Ambulatory Visit: Payer: Self-pay

## 2022-06-23 VITALS — BP 140/76 | HR 75 | Temp 97.6°F | Resp 18 | Ht 62.5 in | Wt 179.0 lb

## 2022-06-23 DIAGNOSIS — C561 Malignant neoplasm of right ovary: Secondary | ICD-10-CM

## 2022-06-23 DIAGNOSIS — Z9221 Personal history of antineoplastic chemotherapy: Secondary | ICD-10-CM | POA: Diagnosis not present

## 2022-06-23 DIAGNOSIS — K828 Other specified diseases of gallbladder: Secondary | ICD-10-CM | POA: Diagnosis not present

## 2022-06-23 DIAGNOSIS — E669 Obesity, unspecified: Secondary | ICD-10-CM | POA: Diagnosis not present

## 2022-06-23 DIAGNOSIS — Z79899 Other long term (current) drug therapy: Secondary | ICD-10-CM | POA: Diagnosis not present

## 2022-06-23 DIAGNOSIS — Z8543 Personal history of malignant neoplasm of ovary: Secondary | ICD-10-CM | POA: Diagnosis not present

## 2022-06-23 DIAGNOSIS — I700 Atherosclerosis of aorta: Secondary | ICD-10-CM | POA: Diagnosis not present

## 2022-06-23 NOTE — Progress Notes (Signed)
[place] OFFICE PROGRESS NOTE  Patient Care Team: [name], MD as PCP - General (Family Medicine)  ASSESSMENT & PLAN:  Ovarian cancer, right [facility]) I have reviewed multiple imaging studies with the patient  We reviewed results of her tumor marker She has no evidence of disease recurrence I plan to see her again in 6 months we have repeat tumor marker She will see GYN surgeon in 3 months for further follow-up  Obesity, Class I, BMI 30-34.9 We discussed association between obesity and cancer recurrence risk We discussed strategies to improve consistency and compliance with dietary modification and exercise She appears very motivated to lose some weight before her next visit  Gallbladder mass She has concern over abnormal finding noted on CT imaging inside her gallbladder On review on prior imaging, that lesion has been present since December 2022 It was described as a polypoid lesion I recommend repeat imaging study in August and she is in agreement She is not symptomatic  Orders Placed This Encounter  Procedures   CT ABDOMEN PELVIS W CONTRAST    Standing Status:   Future    Standing Expiration Date:   06/24/2023    Order Specific Question:   If indicated for the ordered procedure, I authorize the administration of contrast media per Radiology protocol    Answer:   Yes    Order Specific Question:   Preferred imaging location?    Answer:   [facility]    Order Specific Question:   Radiology Contrast Protocol - do NOT remove file path    Answer:   [email or web address]    Order Specific Question:   Is patient pregnant?    Answer:   No    All questions were answered. The patient knows to call the clinic with any problems, questions or concerns. The total time spent in the appointment was 20 minutes encounter with patients including review of chart and various tests results, discussions about plan of care and  coordination of care plan   [name], MD 06/23/2022 3:29 PM  INTERVAL HISTORY: Please see below for problem oriented charting. she returns for surveillance follow-up She is doing well She is somewhat anxious We discussed prior CT imaging finding related to the gallbladder lesion seen We discussed future appointment and follow-up She denies abdominal symptoms such as nausea or changes in bowel habits  REVIEW OF SYSTEMS:   Constitutional: Denies fevers, chills or abnormal weight loss Eyes: Denies blurriness of vision Ears, nose, mouth, throat, and face: Denies mucositis or sore throat Respiratory: Denies cough, dyspnea or wheezes Cardiovascular: Denies palpitation, chest discomfort or lower extremity swelling Gastrointestinal:  Denies nausea, heartburn or change in bowel habits Skin: Denies abnormal skin rashes Lymphatics: Denies new lymphadenopathy or easy bruising Neurological:Denies numbness, tingling or new weaknesses Behavioral/Psych: Mood is stable, no new changes  All other systems were reviewed with the patient and are negative.  I have reviewed the past medical history, past surgical history, social history and family history with the patient and they are unchanged from previous note.  ALLERGIES:  is allergic to codeine.  MEDICATIONS:  Current Outpatient Medications  Medication Sig Dispense Refill   amLODipine (NORVASC) 5 MG tablet Take 1 tablet (5 mg total) by mouth daily. 90 tablet 2   calcium carbonate (TUMS - DOSED IN MG ELEMENTAL CALCIUM) 500 MG chewable tablet Chew 1 tablet by mouth daily.     Cholecalciferol (VITAMIN D3) 1.25 MG (50000 UT) CAPS Vitamin D3  5,000  iu qd     levothyroxine (SYNTHROID) 50 MCG tablet TAKE 1 TABLET BY MOUTH EVERY DAY 90 tablet 3   linaclotide (LINZESS) 145 MCG CAPS capsule Take 1 capsule (145 mcg total) by mouth daily before breakfast. (Patient taking differently: Take 145 mcg by mouth as needed.) 90 capsule 1   OVER THE COUNTER  MEDICATION as needed. Smooth move tea daily     rosuvastatin (CRESTOR) 5 MG tablet Take 1 tablet (5 mg total) by mouth daily. 90 tablet 3   vitamin B-12 (CYANOCOBALAMIN) 1000 MCG tablet Take 1,000 mcg by mouth in the morning.     vitamin C (ASCORBIC ACID) 500 MG tablet Take 500 mg by mouth in the morning.     No current facility-administered medications for this visit.    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 Results of the tumor marker test revealed 62.7.   11/20/2020 Imaging   CT scan of abdomen and pelvis  1. There is a large, cystic mass of the right ovary with peripheral nodular soft tissue components measuring at least 19.8 x 13.3 x 19.8 cm. Findings are consistent with primary ovarian malignancy. 2. Minimal stranding of the omentum in the ventral abdomen and minimal thickening of the peritoneum in the right lower quadrant in the right paracolic gutter without evidence of discrete omental or peritoneal nodularity. Findings are modestly suspicious for early peritoneal metastatic disease. 3. No lymphadenopathy or other evidence of metastatic disease in the abdomen or pelvis. 4. Polyp or adherent gallstone near the gallbladder fundus, better assessed by prior ultrasound. 5. Large burden of stool throughout the colon and rectum.   Aortic Atherosclerosis (ICD10-I70.0).   12/03/2020 Pathology Results   SURGICAL PATHOLOGY  CASE: [id number]  PATIENT: Brooke Khan  Surgical Pathology Report   Clinical History: Ovarian mass (crm)    FINAL MICROSCOPIC DIAGNOSIS:   A. UTERUS, CERVIX, BILATERAL FALLOPIAN TUBES AND OVARIES:  Ovary, right  -  Endometrioid carcinoma, FIGO grade 3  -  See oncology table and comment below   Ovary,  left:  -  No carcinoma identified   Bilateral Fallopian tubes:  -  No carcinoma identified   Uterus:  -  No carcinoma identified   Cervix:  -  No carcinoma identified   B. LYMPH NODES, RIGHT PARAAORTIC, RESECTION:  -  No carcinoma identified in six lymph nodes (0/6)   C. LYMPH NODES, LEFT PARAAORTIC, RESECTION:  -  No carcinoma identified in five lymph nodes (0/5)   D. LYMPH NODES, RIGHT PELVIC, RESECTION:  -  No carcinoma identified in five lymph nodes (0/5)   E. LYMPH NODES, LEFT PELVIC, RESECTION:  -  No carcinoma identified in three lymph nodes (0/3)   F. OMENTUM, OMENTECTOMY:  -  No carcinoma identified   G. PERITONEUM, ANTERIOR PELVIC, BIOPSY:  -  No carcinoma identified   H. PERITONEUM, RIGHT PELVIC, BIOPSY:  -  No carcinoma identified   I. PERITONEUM, LEFT PELVIC, BIOPSY:  -  No carcinoma identified   J. PERITONEUM, POSTERIOR PELVIC, BIOPSY:  -  No carcinoma identified   K. PERITONEUM, RIGHT ABDOMINAL, BIOPSY:  -  No carcinoma identified   L. PERITONEUM, LEFT ABDOMINAL, BIOPSY:  -  No carcinoma identified    ONCOLOGY  TABLE:   OVARY or FALLOPIAN TUBE or PRIMARY PERITONEUM: Resection   Procedure: Hysterectomy with bilateral salpingo-oophorectomy  Specimen Integrity: Intact  Tumor Site: Ovary, right  Tumor Size: 1.5 cm  Histologic Type: Endometrioid carcinoma  Histologic Grade: FIGO grade 3  Ovarian Surface Involvement: Not identified  Fallopian Tube Surface Involvement: Not identified  Other Tissue/ Organ Involvement: Not applicable  Largest Extrapelvic Peritoneal Focus: Not applicable  Peritoneal/Ascitic Fluid Involvement: Malignant cells not identified  Chemotherapy Response Score (CRS): Not applicable, no known presurgical therapy  Regional Lymph Nodes:       Number of Nodes with Metastasis Greater than 10 mm: 0       Number of Nodes with Metastasis 10 mm or Less (excludes isolated  tumor cells): 0       Number of Nodes with Isolated Tumor  Cells (0.2 mm or less): 0       Number of Lymph Nodes Examined: 19  Distant Metastasis:       Distant Site(s) Involved: Not applicable  Pathologic Stage Classification (pTNM, AJCC 8th Edition): pT1a, pN0  Ancillary Studies: Can be performed upon request  Representative Tumor Block: A8  Comment(s): Dr. Vic Ripper reviewed the case and agrees with the above diagnosis.     12/03/2020 Surgery   Surgeon: Donaciano Eva    Operation: Robotic-assisted laparoscopic total hysterectomy with bilateral salpingoophorectomy, omentectomy, pelvic and para-aortic lymphadnectomy.     Surgeon: Donaciano Eva    Operative Findings:  : 20cm cystic mass, smooth. Attachments between inferior aspect of cystic mass and posterior pelvic peritoneum. Normal appearing omentum and nodes. Invasive malignancy on evaluation. No gross extra-ovarian disease. Clinical stage I disease. No gross residual disease.    12/11/2020 Cancer Staging   Staging form: Ovary, Fallopian Tube, and Primary Peritoneal Carcinoma, AJCC 8th Edition - Pathologic stage from 12/11/2020: FIGO Stage IA (pT1a, pN0, cM0) - Signed by [name], MD on 12/11/2020 Stage prefix: Initial diagnosis   01/10/2021 Genetic Testing   Negative germline and somatic genetic testing: no pathogenic variants detected in [employer] MyRisk Panel.   No somatic variants detected in [employer] MyChoice HRD testing.  Genomic instaiblity score is negative.  The report dates are January 10, 2021.    The [facility] gene panel offered by [employer] includes sequencing and deletion/duplication testing of the following 48 genes: APC, ATM, AXIN2, BAP1, BARD1, BMPR1A, BRCA1, BRCA2, BRIP1, CHD1, CDK4, CDKN2A(p16 and p14ARF), CHEK2, CTNNA1, EGFR, EPCAM, FH, FLCN, GREM1, HOXB13, MEN1, MET, MITF , MLH1, MSH2, MSH3, MSH6, MUTYH, NTHL1, PALB2, PMS2, POLD1, POLE, PTEN, RAD51C, RAD51D, RET, SDHA, SDHB, SDHC, SDHD, SMAD4, STK11,TERT, TP53, TSC1, TSC2, and VHL.    01/15/2021 -  03/17/2021 Chemotherapy   Patient is on Treatment Plan : OVARIAN Carboplatin (AUC 6) / Paclitaxel (175) q21d x 6 cycles     04/15/2021 Tumor Marker   Patient's tumor was tested for the following markers: Ca-125. Results of the tumor marker test revealed 7.7.   04/16/2021 Imaging   1. New left periaortic adenopathy and new left omental and mesenteric root nodules, indicative of metastatic disease. 2. Right lower lobe nodules are stable. 3. Hepatic steatosis. 4. Cholelithiasis. 5. Left renal stone. 6.  Aortic atherosclerosis (ICD10-I70.0).   06/17/2021 Imaging   1. Small RIGHT retroperitoneal nodule not significant changed. Recommend attention on follow-up. 2. Two low-density para-aortic lymph nodes unchanged from comparison exam. 3. No clear evidence omental or peritoneal metastasis. 4. Stable small pulmonary nodule in the RIGHT lower lobe.     06/19/2021  Tumor Marker   Patient's tumor was tested for the following markers: CA-125. Results of the tumor marker test revealed 6.3.   12/17/2021 Imaging   Stable small low-attenuation retroperitoneal lymph nodes in left para-aortic region. No new or progressive disease within the abdomen or pelvis.   Stable 6 mm polypoid density along the nondependent gallbladder wall consistent with gallbladder polyp. Recommend continued imaging follow-up with ultrasound in 6-12 months.   Stable tiny nonobstructing left renal calculus.   Mild increase in size of midline paraumbilical hernia, which contains only fat.   Aortic Atherosclerosis (ICD10-I70.0).   12/17/2021 Tumor Marker   Patient's tumor was tested for the following markers: CA-125. Results of the tumor marker test revealed 6.8.   03/19/2022 Tumor Marker   Patient's tumor was tested for the following markers: CA-125. Results of the tumor marker test revealed 6.5.   06/22/2022 Tumor Marker   Patient's tumor was tested for the following markers: CA-125. Results of the tumor marker test revealed  6.3.     PHYSICAL EXAMINATION: ECOG PERFORMANCE STATUS: 0 - Asymptomatic  Vitals:   06/23/22 1011  BP: (!) 140/76  Pulse: 75  Resp: 18  Temp: 97.6 F (36.4 C)  SpO2: 100%   Filed Weights   06/23/22 1011  Weight: 179 lb (81.2 kg)    GENERAL:alert, no distress and comfortable NEURO: alert & oriented x 3 with fluent speech, no focal motor/sensory deficits  LABORATORY DATA:  I have reviewed the data as listed    Component Value Date/Time   NA 143 06/19/2022 1008   K 4.5 06/19/2022 1008   CL 108 06/19/2022 1008   CO2 28 06/19/2022 1008   GLUCOSE 110 (H) 06/19/2022 1008   BUN 15 06/19/2022 1008   CREATININE 0.80 06/19/2022 1008   CREATININE 0.97 03/18/2022 1523   CALCIUM 10.0 06/19/2022 1008   PROT 7.3 06/19/2022 1008   ALBUMIN 4.3 06/19/2022 1008   AST 19 06/19/2022 1008   AST 20 03/18/2022 1523   ALT 28 06/19/2022 1008   ALT 26 03/18/2022 1523   ALKPHOS 79 06/19/2022 1008   BILITOT 0.4 06/19/2022 1008   BILITOT 0.6 03/18/2022 1523   GFRNONAA >60 06/19/2022 1008   GFRNONAA >60 03/18/2022 1523    No results found for: "SPEP", "UPEP"  Lab Results  Component Value Date   WBC 6.0 06/19/2022   NEUTROABS 3.8 06/19/2022   HGB 13.6 06/19/2022   HCT 40.0 06/19/2022   MCV 91.7 06/19/2022   PLT 315 06/19/2022      Chemistry      Component Value Date/Time   NA 143 06/19/2022 1008   K 4.5 06/19/2022 1008   CL 108 06/19/2022 1008   CO2 28 06/19/2022 1008   BUN 15 06/19/2022 1008   CREATININE 0.80 06/19/2022 1008   CREATININE 0.97 03/18/2022 1523      Component Value Date/Time   CALCIUM 10.0 06/19/2022 1008   ALKPHOS 79 06/19/2022 1008   AST 19 06/19/2022 1008   AST 20 03/18/2022 1523   ALT 28 06/19/2022 1008   ALT 26 03/18/2022 1523   BILITOT 0.4 06/19/2022 1008   BILITOT 0.6 03/18/2022 1523       RADIOGRAPHIC STUDIES: I have reviewed prior CT imaging with the patient I have personally reviewed the radiological images as listed and agreed with the  findings in the report. MM DIAG BREAST TOMO UNI LEFT  Result Date: 06/04/2022 CLINICAL DATA:  57 year old female for further evaluation of possible LEFT breast distortion on screening  mammogram. EXAM: DIGITAL DIAGNOSTIC UNILATERAL LEFT MAMMOGRAM WITH TOMOSYNTHESIS; ULTRASOUND LEFT BREAST LIMITED TECHNIQUE: Left digital diagnostic mammography and breast tomosynthesis was performed.; Targeted ultrasound examination of the left breast was performed. COMPARISON:  Previous exam(s). ACR Breast Density Category c: The breast tissue is heterogeneously dense, which may obscure small masses. FINDINGS: Full field and spot compression views of the LEFT breast demonstrate no definite persistent abnormality within the UPPER-OUTER LEFT breast in the area of the screening study finding. However heterogeneously dense breast tissue in this area may limit sensitivity. Targeted ultrasound is performed, showing no sonographic abnormality within the UPPER-OUTER LEFT breast. Normal dense fibroglandular tissue is identified. IMPRESSION: No persistent mammographic or sonographic abnormality in the area of the screening study finding. RECOMMENDATION: Bilateral screening mammogram in 1 year. I have discussed the findings and recommendations with the patient. If applicable, a reminder letter will be sent to the patient regarding the next appointment. BI-RADS CATEGORY  1: Negative. Electronically Signed   By: Margarette Canada M.D.   On: 06/04/2022 08:40   US BREAST LTD UNI LEFT INC AXILLA  Result Date: 06/04/2022 CLINICAL DATA:  57 year old female for further evaluation of possible LEFT breast distortion on screening mammogram. EXAM: DIGITAL DIAGNOSTIC UNILATERAL LEFT MAMMOGRAM WITH TOMOSYNTHESIS; ULTRASOUND LEFT BREAST LIMITED TECHNIQUE: Left digital diagnostic mammography and breast tomosynthesis was performed.; Targeted ultrasound examination of the left breast was performed. COMPARISON:  Previous exam(s). ACR Breast Density Category c:  The breast tissue is heterogeneously dense, which may obscure small masses. FINDINGS: Full field and spot compression views of the LEFT breast demonstrate no definite persistent abnormality within the UPPER-OUTER LEFT breast in the area of the screening study finding. However heterogeneously dense breast tissue in this area may limit sensitivity. Targeted ultrasound is performed, showing no sonographic abnormality within the UPPER-OUTER LEFT breast. Normal dense fibroglandular tissue is identified. IMPRESSION: No persistent mammographic or sonographic abnormality in the area of the screening study finding. RECOMMENDATION: Bilateral screening mammogram in 1 year. I have discussed the findings and recommendations with the patient. If applicable, a reminder letter will be sent to the patient regarding the next appointment. BI-RADS CATEGORY  1: Negative. Electronically Signed   By: Margarette Canada M.D.   On: 06/04/2022 08:40

## 2022-06-23 NOTE — Assessment & Plan Note (Signed)
We discussed association between obesity and cancer recurrence risk We discussed strategies to improve consistency and compliance with dietary modification and exercise She appears very motivated to lose some weight before her next visit

## 2022-06-23 NOTE — Assessment & Plan Note (Signed)
I have reviewed multiple imaging studies with the patient  We reviewed results of her tumor marker She has no evidence of disease recurrence I plan to see her again in 6 months we have repeat tumor marker She will see GYN surgeon in 3 months for further follow-up

## 2022-06-23 NOTE — Assessment & Plan Note (Signed)
She has concern over abnormal finding noted on CT imaging inside her gallbladder On review on prior imaging, that lesion has been present since December 2022 It was described as a polypoid lesion I recommend repeat imaging study in August and she is in agreement She is not symptomatic

## 2022-07-08 ENCOUNTER — Telehealth: Payer: Self-pay | Admitting: *Deleted

## 2022-07-08 NOTE — Telephone Encounter (Signed)
Patient called and rescheduled her appt from 5/17 to 5/10 with a lab appt. Per Dr Charisse March last note patient needs CT with next appt. Explained that the office will reach out to Dr Berline Lopes for when to have the next scan

## 2022-07-10 NOTE — Telephone Encounter (Signed)
Per Dr Berline Lopes, CT scheduled for 5/8, patient aware

## 2022-07-27 ENCOUNTER — Encounter: Payer: Self-pay | Admitting: Family Medicine

## 2022-07-28 DIAGNOSIS — H10812 Pingueculitis, left eye: Secondary | ICD-10-CM | POA: Diagnosis not present

## 2022-07-28 DIAGNOSIS — H10403 Unspecified chronic conjunctivitis, bilateral: Secondary | ICD-10-CM | POA: Diagnosis not present

## 2022-08-18 DIAGNOSIS — H10403 Unspecified chronic conjunctivitis, bilateral: Secondary | ICD-10-CM | POA: Diagnosis not present

## 2022-08-18 DIAGNOSIS — H10812 Pingueculitis, left eye: Secondary | ICD-10-CM | POA: Diagnosis not present

## 2022-08-18 DIAGNOSIS — R7309 Other abnormal glucose: Secondary | ICD-10-CM | POA: Diagnosis not present

## 2022-08-31 ENCOUNTER — Encounter: Payer: Self-pay | Admitting: Family Medicine

## 2022-09-01 DIAGNOSIS — H1045 Other chronic allergic conjunctivitis: Secondary | ICD-10-CM | POA: Diagnosis not present

## 2022-09-01 DIAGNOSIS — H10812 Pingueculitis, left eye: Secondary | ICD-10-CM | POA: Diagnosis not present

## 2022-09-01 DIAGNOSIS — H04123 Dry eye syndrome of bilateral lacrimal glands: Secondary | ICD-10-CM | POA: Diagnosis not present

## 2022-09-02 ENCOUNTER — Ambulatory Visit: Payer: BC Managed Care – PPO | Admitting: Physician Assistant

## 2022-09-02 ENCOUNTER — Encounter: Payer: Self-pay | Admitting: Physician Assistant

## 2022-09-02 VITALS — BP 150/90 | HR 58 | Temp 97.7°F | Ht 62.5 in | Wt 179.2 lb

## 2022-09-02 DIAGNOSIS — R519 Headache, unspecified: Secondary | ICD-10-CM | POA: Diagnosis not present

## 2022-09-02 DIAGNOSIS — E559 Vitamin D deficiency, unspecified: Secondary | ICD-10-CM | POA: Diagnosis not present

## 2022-09-02 DIAGNOSIS — E039 Hypothyroidism, unspecified: Secondary | ICD-10-CM

## 2022-09-02 NOTE — Patient Instructions (Signed)
It was great to see you!  I will be in touch with results tomorrow  Get help right away if: You lose your vision. Your pain does not go away, even after you take medicine. You have chest pain. You have trouble breathing. One side of your face or body gets weak or numb all of a sudden.   Take care,  Jarold Motto PA-C

## 2022-09-02 NOTE — Progress Notes (Signed)
Brooke Khan is a 57 y.o. female here for a new problem.  History of Present Illness:   Chief Complaint  Patient presents with   Headache    HPI  Temporal Headaches Has been having temporal headaches since the end of February 2023. Pain concentrated on left temple. 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  Still having some left eye discomfort. Denies vision loss. Has some associated jaw soreness. Denies fever, chills, night sweats. Denies recent Covid-19 infection. Denies history of migraine headaches. Denies weakness in arms, legs. Denies change in medications.   Vit D deficiency Requesting Vitamin D check. Takes Vitamin D3 5,000 units daily   Hypothyroidism She is currently taking levothyroxine 50 mcg daily Tolerating well Would like this updated  Past Medical History:  Diagnosis Date   Bradycardia    Chicken pox    Elevated blood pressure reading    Epigastric abdominal pain    Family history of breast cancer 12/26/2020   Family history of prostate cancer 12/26/2020   Heart murmur    since childhood- not always heard   Hyperglycemia    Hyperlipidemia    borderline- no meds   Hypothyroidism (acquired)    Lightheaded    ovarian ca 11/2020   Pre-diabetes    Vitamin D deficiency      Social History   Tobacco Use   Smoking status: Never   Smokeless tobacco: Never  Vaping Use   Vaping Use: Never used  Substance Use Topics   Alcohol use: Yes    Comment: rare   Drug use: No    Past Surgical History:  Procedure Laterality Date   CESAREAN SECTION     x2   cosmetic knee surgery     as child   LAPAROTOMY N/A 12/03/2020   Procedure: MINI LAPAROTOMY FOR CYST DECOMPRESSION;  Surgeon: Adolphus Birchwood, MD;  Location: [facility];  Service: Gynecology;  Laterality: N/A;   ROBOTIC ASSISTED TOTAL HYSTERECTOMY WITH BILATERAL SALPINGO OOPHERECTOMY N/A 12/03/2020    Procedure: XI ROBOTIC ASSISTED TOTAL HYSTERECTOMY WITH BILATERAL SALPINGO OOPHORECTOMY WITH PARA-AORTIC AND PELVIC LYMPHADNECTOMY AND OMENTECTOMY;  Surgeon: Adolphus Birchwood, MD;  Location: [facility];  Service: Gynecology;  Laterality: N/A;    Family History  Problem Relation Age of Onset   Elevated Lipids Mother    Prostate cancer Father        dx 39s   Elevated Lipids Father    Heart disease Father        mid 88s. had been remote smoker.    Hypertension Father    Diabetes Father        mid 39s   Dementia Father        vascular based   Skin cancer Father        SCC, dx after 39   Healthy Brother    Breast cancer Maternal Aunt 75   Prostate cancer Maternal Uncle        dx 63s   Lymphoma Paternal Uncle        dx after 71   Diabetes Maternal Grandmother    Arthritis Maternal Grandfather    Diabetes Paternal Grandfather    Cancer Cousin        unknown type; ?throat ?lymphoma d. 27   Colon cancer Neg Hx    Colon polyps Neg Hx    Esophageal cancer Neg Hx    Rectal cancer Neg Hx  Stomach cancer Neg Hx     Allergies  Allergen Reactions   Codeine Nausea Only and Other (See Comments)    "Feel bad"    Current Medications:   Current Outpatient Medications:    amLODipine (NORVASC) 5 MG tablet, Take 1 tablet (5 mg total) by mouth daily., Disp: 90 tablet, Rfl: 2   calcium carbonate (TUMS - DOSED IN MG ELEMENTAL CALCIUM) 500 MG chewable tablet, Chew 1 tablet by mouth daily., Disp: , Rfl:    Cholecalciferol (VITAMIN D3) 1.25 MG (50000 UT) CAPS, Vitamin D3  5,000 iu qd, Disp: , Rfl:    fluorometholone (FML) 0.1 % ophthalmic suspension, 1 drop 4 (four) times daily., Disp: , Rfl:    levothyroxine (SYNTHROID) 50 MCG tablet, TAKE 1 TABLET BY MOUTH EVERY DAY, Disp: 90 tablet, Rfl: 3   linaclotide (LINZESS) 145 MCG CAPS capsule, Take 1 capsule (145 mcg total) by mouth daily before breakfast. (Patient taking differently: Take 145 mcg by mouth as needed.), Disp: 90 capsule, Rfl: 1   rosuvastatin  (CRESTOR) 5 MG tablet, Take 1 tablet (5 mg total) by mouth daily., Disp: 90 tablet, Rfl: 3   vitamin B-12 (CYANOCOBALAMIN) 1000 MCG tablet, Take 1,000 mcg by mouth in the morning., Disp: , Rfl:    vitamin C (ASCORBIC ACID) 500 MG tablet, Take 500 mg by mouth in the morning., Disp: , Rfl:    Review of Systems:   Review of Systems  Constitutional:  Negative for chills, diaphoresis (Night), fever and malaise/fatigue.  HENT:  Negative for congestion.   Eyes:  Positive for pain (Left). Negative for blurred vision and double vision.  Respiratory:  Negative for cough and shortness of breath.   Cardiovascular:  Negative for chest pain, palpitations and leg swelling.  Gastrointestinal:  Negative for vomiting.  Musculoskeletal:  Negative for back pain.  Skin:  Negative for rash.  Neurological:  Positive for headaches (Left temple). Negative for loss of consciousness and weakness.    Vitals:   Vitals:   09/02/22 1520 09/02/22 1548  BP: (!) 140/90 (!) 150/90  Pulse: (!) 58   Temp: 97.7 F (36.5 C)   TempSrc: Temporal   SpO2: 98%   Weight: 179 lb 4 oz (81.3 kg)   Height: 5' 2.5" (1.588 m)      Body mass index is 32.26 kg/m.  Physical Exam:   Physical Exam Vitals and nursing note reviewed.  Constitutional:      General: She is not in acute distress.    Appearance: She is well-developed. She is not ill-appearing or toxic-appearing.  HENT:     Head:     Comments: Clicking noted on L jaw with movement  Mild tenderness to L temporal region - no erythema or bulging vessels present Cardiovascular:     Rate and Rhythm: Normal rate and regular rhythm.     Pulses: Normal pulses.     Heart sounds: Normal heart sounds, S1 normal and S2 normal.  Pulmonary:     Effort: Pulmonary effort is normal.     Breath sounds: Normal breath sounds.  Skin:    General: Skin is warm and dry.  Neurological:     Mental Status: She is alert.     GCS: GCS eye subscore is 4. GCS verbal subscore is 5. GCS  motor subscore is 6.     Cranial Nerves: Cranial nerves 2-12 are intact.     Sensory: Sensation is intact.     Motor: Motor function is intact.  Coordination: Coordination is intact.     Gait: Gait is intact.  Psychiatric:        Speech: Speech normal.        Behavior: Behavior normal. Behavior is cooperative.     Assessment and Plan:   Left temporal headache Will update blood work today to assess ESR and CRP as Giant Cell Arteritis is in the differential and we discussed this; thankfully she has had no vision loss and has seen eye doctor since symptoms started and has had least one fundoscopic and slit lamp exam with ophtho Worsening precautions advised -- low threshold to go to the ER if new/worsening sx If ESR or CRP elevated, will initiate steroids and send for urgent temporal artery biopsy Further recommendations based on results and clinical symptoms  Blood pressure initially 140/90 and then on recheck is 150/90. No obvious end-organ damage Recommend monitoring at home and reach out to PCP if consistently >140/90 If worsening  Vitamin D deficiency Update Vit D and provide recommendations  Hypothyroidism, unspecified type Update TSH and adjust levothyroxine accordingly    I,Alexander Ruley,acting as a scribe for [employer], PA.,have documented all relevant documentation on the behalf of Jarold Motto, PA,as directed by  Jarold Motto, PA while in the presence of Jarold Motto, [place].   I, Jarold Motto, [place], have reviewed all documentation for this visit. The documentation on 09/02/22 for the exam, diagnosis, procedures, and orders are all accurate and complete.  I spent a total of 48 minutes on this visit, today 09/02/22, which included reviewing previous notes from optho on 08/18/22 and cardiology on 04/09/22, ordering tests, discussing plan of care with patient and using shared-decision making on next steps, refilling medications, and documenting the findings  in the note.    Jarold Motto, PA-C

## 2022-09-03 LAB — COMPREHENSIVE METABOLIC PANEL
ALT: 35 U/L (ref 0–35)
AST: 21 U/L (ref 0–37)
Albumin: 4.7 g/dL (ref 3.5–5.2)
Alkaline Phosphatase: 74 U/L (ref 39–117)
BUN: 17 mg/dL (ref 6–23)
CO2: 27 mEq/L (ref 19–32)
Calcium: 10 mg/dL (ref 8.4–10.5)
Chloride: 105 mEq/L (ref 96–112)
Creatinine, Ser: 0.98 mg/dL (ref 0.40–1.20)
GFR: 63.37 mL/min (ref >60.00)
Glucose, Bld: 102 mg/dL — ABNORMAL HIGH (ref 70–99)
Potassium: 3.9 mEq/L (ref 3.5–5.1)
Sodium: 142 mEq/L (ref 135–145)
Total Bilirubin: 0.3 mg/dL (ref 0.2–1.2)
Total Protein: 7.6 g/dL (ref 6.0–8.3)

## 2022-09-03 LAB — CBC WITH DIFFERENTIAL/PLATELET
Basophils Absolute: 0.1 10*3/uL (ref 0.0–0.1)
Basophils Relative: 1 % (ref 0.0–3.0)
Eosinophils Absolute: 0.1 10*3/uL (ref 0.0–0.7)
Eosinophils Relative: 2.2 % (ref 0.0–5.0)
HCT: 42.5 % (ref 36.0–46.0)
Hemoglobin: 14.4 g/dL (ref 12.0–15.0)
Lymphocytes Relative: 25.8 % (ref 12.0–46.0)
Lymphs Abs: 1.7 10*3/uL (ref 0.7–4.0)
MCHC: 33.8 g/dL (ref 30.0–36.0)
MCV: 91.9 fl (ref 78.0–100.0)
Monocytes Absolute: 0.4 10*3/uL (ref 0.1–1.0)
Monocytes Relative: 6.4 % (ref 3.0–12.0)
Neutro Abs: 4.3 10*3/uL (ref 1.4–7.7)
Neutrophils Relative %: 64.6 % (ref 43.0–77.0)
Platelets: 296 10*3/uL (ref 150.0–400.0)
RBC: 4.62 Mil/uL (ref 3.87–5.11)
RDW: 13.7 % (ref 11.5–15.5)
WBC: 6.7 10*3/uL (ref 4.0–10.5)

## 2022-09-03 LAB — TSH: TSH: 1.65 u[IU]/mL (ref 0.35–5.50)

## 2022-09-03 LAB — C-REACTIVE PROTEIN: CRP: <1 mg/dL (ref 0.5–20.0)

## 2022-09-03 LAB — VITAMIN D 25 HYDROXY (VIT D DEFICIENCY, FRACTURES): VITD: 47.3 ng/mL (ref 30.00–100.00)

## 2022-09-03 LAB — SEDIMENTATION RATE: Sed Rate: 18 mm/hr (ref 0–30)

## 2022-09-03 NOTE — Addendum Note (Signed)
Addended by: [name] on: 09/03/2022 02:10 PM   Modules accepted: Orders

## 2022-09-16 ENCOUNTER — Ambulatory Visit (HOSPITAL_COMMUNITY)
Admission: RE | Admit: 2022-09-16 | Discharge: 2022-09-16 | Disposition: A | Discharge status: Home / Self Care | Payer: BC Managed Care – PPO | Source: Ambulatory Visit | Attending: Hematology and Oncology | Admitting: Hematology and Oncology

## 2022-09-16 ENCOUNTER — Inpatient Hospital Stay: Payer: BC Managed Care – PPO | Attending: Gynecologic Oncology

## 2022-09-16 ENCOUNTER — Other Ambulatory Visit: Payer: Self-pay

## 2022-09-16 ENCOUNTER — Telehealth: Payer: Self-pay

## 2022-09-16 DIAGNOSIS — I700 Atherosclerosis of aorta: Secondary | ICD-10-CM | POA: Insufficient documentation

## 2022-09-16 DIAGNOSIS — E785 Hyperlipidemia, unspecified: Secondary | ICD-10-CM | POA: Insufficient documentation

## 2022-09-16 DIAGNOSIS — Z8543 Personal history of malignant neoplasm of ovary: Secondary | ICD-10-CM | POA: Insufficient documentation

## 2022-09-16 DIAGNOSIS — C561 Malignant neoplasm of right ovary: Secondary | ICD-10-CM | POA: Insufficient documentation

## 2022-09-16 DIAGNOSIS — E039 Hypothyroidism, unspecified: Secondary | ICD-10-CM | POA: Insufficient documentation

## 2022-09-16 DIAGNOSIS — N200 Calculus of kidney: Secondary | ICD-10-CM | POA: Insufficient documentation

## 2022-09-16 DIAGNOSIS — Z803 Family history of malignant neoplasm of breast: Secondary | ICD-10-CM | POA: Insufficient documentation

## 2022-09-16 DIAGNOSIS — E559 Vitamin D deficiency, unspecified: Secondary | ICD-10-CM | POA: Insufficient documentation

## 2022-09-16 DIAGNOSIS — R011 Cardiac murmur, unspecified: Secondary | ICD-10-CM | POA: Insufficient documentation

## 2022-09-16 DIAGNOSIS — Z8042 Family history of malignant neoplasm of prostate: Secondary | ICD-10-CM | POA: Insufficient documentation

## 2022-09-16 DIAGNOSIS — Z79899 Other long term (current) drug therapy: Secondary | ICD-10-CM | POA: Insufficient documentation

## 2022-09-16 DIAGNOSIS — Z79890 Hormone replacement therapy (postmenopausal): Secondary | ICD-10-CM | POA: Insufficient documentation

## 2022-09-16 DIAGNOSIS — R918 Other nonspecific abnormal finding of lung field: Secondary | ICD-10-CM | POA: Insufficient documentation

## 2022-09-16 DIAGNOSIS — K8020 Calculus of gallbladder without cholecystitis without obstruction: Secondary | ICD-10-CM | POA: Insufficient documentation

## 2022-09-16 DIAGNOSIS — K429 Umbilical hernia without obstruction or gangrene: Secondary | ICD-10-CM | POA: Insufficient documentation

## 2022-09-16 DIAGNOSIS — K828 Other specified diseases of gallbladder: Secondary | ICD-10-CM | POA: Diagnosis not present

## 2022-09-16 DIAGNOSIS — K760 Fatty (change of) liver, not elsewhere classified: Secondary | ICD-10-CM | POA: Insufficient documentation

## 2022-09-16 LAB — COMPREHENSIVE METABOLIC PANEL
ALT: 25 U/L (ref 0–44)
AST: 18 U/L (ref 15–41)
Albumin: 4.5 g/dL (ref 3.5–5.0)
Alkaline Phosphatase: 74 U/L (ref 38–126)
Anion gap: 6 (ref 5–15)
BUN: 20 mg/dL (ref 6–20)
CO2: 28 mmol/L (ref 22–32)
Calcium: 9.6 mg/dL (ref 8.9–10.3)
Chloride: 108 mmol/L (ref 98–111)
Creatinine, Ser: 0.86 mg/dL (ref 0.44–1.00)
GFR, Estimated: >60 mL/min (ref >60)
Glucose, Bld: 100 mg/dL — ABNORMAL HIGH (ref 70–99)
Potassium: 3.9 mmol/L (ref 3.5–5.1)
Sodium: 142 mmol/L (ref 135–145)
Total Bilirubin: 0.5 mg/dL (ref 0.3–1.2)
Total Protein: 7.2 g/dL (ref 6.5–8.1)

## 2022-09-16 LAB — CBC WITH DIFFERENTIAL/PLATELET
Abs Immature Granulocytes: 0.01 10*3/uL (ref 0.00–0.07)
Basophils Absolute: 0 10*3/uL (ref 0.0–0.1)
Basophils Relative: 0 %
Eosinophils Absolute: 0.2 10*3/uL (ref 0.0–0.5)
Eosinophils Relative: 2 %
HCT: 39.9 % (ref 36.0–46.0)
Hemoglobin: 13.1 g/dL (ref 12.0–15.0)
Immature Granulocytes: 0 %
Lymphocytes Relative: 28 %
Lymphs Abs: 2.1 10*3/uL (ref 0.7–4.0)
MCH: 30.7 pg (ref 26.0–34.0)
MCHC: 32.8 g/dL (ref 30.0–36.0)
MCV: 93.4 fL (ref 80.0–100.0)
Monocytes Absolute: 0.5 10*3/uL (ref 0.1–1.0)
Monocytes Relative: 7 %
Neutro Abs: 4.6 10*3/uL (ref 1.7–7.7)
Neutrophils Relative %: 63 %
Platelets: 254 10*3/uL (ref 150–400)
RBC: 4.27 MIL/uL (ref 3.87–5.11)
RDW: 13 % (ref 11.5–15.5)
WBC: 7.4 10*3/uL (ref 4.0–10.5)
nRBC: 0 % (ref 0.0–0.2)

## 2022-09-16 MED ORDER — IOHEXOL 9 MG/ML PO SOLN
500.0000 mL | ORAL | Status: AC
  Administered 2022-09-16: 1000 mL via ORAL

## 2022-09-16 MED ORDER — IOHEXOL 9 MG/ML PO SOLN
ORAL | Status: AC
  Filled 2022-09-16: qty 1000

## 2022-09-16 MED ORDER — SODIUM CHLORIDE (PF) 0.9 % IJ SOLN
INTRAMUSCULAR | Status: AC
  Filled 2022-09-16: qty 50

## 2022-09-16 MED ORDER — IOHEXOL 300 MG/ML  SOLN
100.0000 mL | Freq: Once | INTRAMUSCULAR | Status: AC | PRN
  Administered 2022-09-16: 100 mL via INTRAVENOUS

## 2022-09-16 NOTE — Telephone Encounter (Signed)
Pt aware of Dr.Tucker's message. Pt is wondering if insurance will pay for all the labs since getting them back to back. She will wait to see if she gets a bill and will handle it at that time.

## 2022-09-16 NOTE — Telephone Encounter (Signed)
Pt called triage line after hours stating she has an appointment with Dr. Pricilla Holm on Friday.   1) She just had labs drawn on 4/24. (Except CA125) and she is scheduled today 5/8, for a CT scan with labs. She is wondering if insurance will pay for all of this and since she just had labs drawn in April does she still need labs for the scan? (It look like a CMP was ordered before scan)  2) Does she need a lab appointment for the CA125 before Friday's appointment with Dr.Tucker. (There is no order)

## 2022-09-18 ENCOUNTER — Encounter: Payer: Self-pay | Admitting: Gynecologic Oncology

## 2022-09-18 ENCOUNTER — Other Ambulatory Visit: Payer: Self-pay

## 2022-09-18 ENCOUNTER — Inpatient Hospital Stay (HOSPITAL_BASED_OUTPATIENT_CLINIC_OR_DEPARTMENT_OTHER): Payer: BC Managed Care – PPO | Admitting: Gynecologic Oncology

## 2022-09-18 VITALS — BP 146/66 | HR 62 | Temp 98.6°F | Wt 180.4 lb

## 2022-09-18 DIAGNOSIS — E559 Vitamin D deficiency, unspecified: Secondary | ICD-10-CM | POA: Diagnosis not present

## 2022-09-18 DIAGNOSIS — Z8543 Personal history of malignant neoplasm of ovary: Secondary | ICD-10-CM | POA: Diagnosis not present

## 2022-09-18 DIAGNOSIS — E785 Hyperlipidemia, unspecified: Secondary | ICD-10-CM | POA: Diagnosis not present

## 2022-09-18 DIAGNOSIS — E039 Hypothyroidism, unspecified: Secondary | ICD-10-CM | POA: Diagnosis not present

## 2022-09-18 DIAGNOSIS — Z79890 Hormone replacement therapy (postmenopausal): Secondary | ICD-10-CM | POA: Diagnosis not present

## 2022-09-18 DIAGNOSIS — C561 Malignant neoplasm of right ovary: Secondary | ICD-10-CM

## 2022-09-18 DIAGNOSIS — K429 Umbilical hernia without obstruction or gangrene: Secondary | ICD-10-CM | POA: Diagnosis not present

## 2022-09-18 DIAGNOSIS — I700 Atherosclerosis of aorta: Secondary | ICD-10-CM | POA: Diagnosis not present

## 2022-09-18 DIAGNOSIS — Z8042 Family history of malignant neoplasm of prostate: Secondary | ICD-10-CM | POA: Diagnosis not present

## 2022-09-18 DIAGNOSIS — K8020 Calculus of gallbladder without cholecystitis without obstruction: Secondary | ICD-10-CM | POA: Diagnosis not present

## 2022-09-18 DIAGNOSIS — R011 Cardiac murmur, unspecified: Secondary | ICD-10-CM | POA: Diagnosis not present

## 2022-09-18 DIAGNOSIS — N200 Calculus of kidney: Secondary | ICD-10-CM | POA: Diagnosis not present

## 2022-09-18 DIAGNOSIS — Z803 Family history of malignant neoplasm of breast: Secondary | ICD-10-CM | POA: Diagnosis not present

## 2022-09-18 DIAGNOSIS — K760 Fatty (change of) liver, not elsewhere classified: Secondary | ICD-10-CM | POA: Diagnosis not present

## 2022-09-18 DIAGNOSIS — Z79899 Other long term (current) drug therapy: Secondary | ICD-10-CM | POA: Diagnosis not present

## 2022-09-18 DIAGNOSIS — R918 Other nonspecific abnormal finding of lung field: Secondary | ICD-10-CM | POA: Diagnosis not present

## 2022-09-18 LAB — CA 125: Cancer Antigen (CA) 125: 6.3 U/mL (ref 0.0–38.1)

## 2022-09-18 NOTE — Patient Instructions (Signed)
It was good to see you today.  I do not see or feel any evidence of cancer recurrence on your exam.  I will see you for follow-up in 5 months.  As always, if you develop any new and concerning symptoms before your next visit, please call to see me sooner.  

## 2022-09-18 NOTE — Progress Notes (Signed)
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 Results of the tumor marker test revealed 62.7.   11/20/2020 Imaging   CT scan of abdomen and pelvis  1. There is a large, cystic mass of the right ovary with peripheral nodular soft tissue components measuring at least 19.8 x 13.3 x 19.8 cm. Findings are consistent with primary ovarian malignancy. 2. Minimal stranding of the omentum in the ventral abdomen and minimal thickening of the peritoneum in the right lower quadrant in the right paracolic gutter without evidence of discrete omental or peritoneal nodularity. Findings are modestly suspicious for early peritoneal metastatic disease. 3. No lymphadenopathy or other evidence of metastatic disease in the abdomen or pelvis. 4. Polyp or adherent gallstone near the gallbladder fundus, better assessed by prior ultrasound. 5. Large burden of stool throughout the colon and rectum.   Aortic Atherosclerosis (ICD10-I70.0).   12/03/2020 Pathology Results   SURGICAL PATHOLOGY  CASE: [id number]  PATIENT: Merrilyn Puma  Surgical Pathology Report   Clinical History: Ovarian mass (crm)    FINAL MICROSCOPIC DIAGNOSIS:   A. UTERUS, CERVIX, BILATERAL FALLOPIAN TUBES AND OVARIES:  Ovary, right  -  Endometrioid carcinoma, FIGO grade 3  -  See oncology table and comment below   Ovary, left:  -  No carcinoma identified   Bilateral Fallopian tubes:  -  No carcinoma identified   Uterus:  -  No carcinoma identified   Cervix:  -  No carcinoma identified   B. LYMPH NODES, RIGHT PARAAORTIC, RESECTION:  -  No carcinoma  identified in six lymph nodes (0/6)   C. LYMPH NODES, LEFT PARAAORTIC, RESECTION:  -  No carcinoma identified in five lymph nodes (0/5)   D. LYMPH NODES, RIGHT PELVIC, RESECTION:  -  No carcinoma identified in five lymph nodes (0/5)   E. LYMPH NODES, LEFT PELVIC, RESECTION:  -  No carcinoma identified in three lymph nodes (0/3)   F. OMENTUM, OMENTECTOMY:  -  No carcinoma identified   G. PERITONEUM, ANTERIOR PELVIC, BIOPSY:  -  No carcinoma identified   H. PERITONEUM, RIGHT PELVIC, BIOPSY:  -  No carcinoma identified   I. PERITONEUM, LEFT PELVIC, BIOPSY:  -  No carcinoma identified   J. PERITONEUM, POSTERIOR PELVIC, BIOPSY:  -  No carcinoma identified   K. PERITONEUM, RIGHT ABDOMINAL, BIOPSY:  -  No carcinoma identified   L. PERITONEUM, LEFT ABDOMINAL, BIOPSY:  -  No carcinoma identified    ONCOLOGY TABLE:   OVARY or FALLOPIAN TUBE or PRIMARY PERITONEUM: Resection   Procedure: Hysterectomy with bilateral salpingo-oophorectomy  Specimen Integrity: Intact  Tumor Site: Ovary, right  Tumor Size: 1.5 cm  Histologic Type: Endometrioid carcinoma  Histologic Grade: FIGO grade 3  Ovarian Surface Involvement: Not identified  Fallopian Tube Surface Involvement: Not identified  Other Tissue/ Organ Involvement: Not applicable  Largest Extrapelvic Peritoneal Focus: Not applicable  Peritoneal/Ascitic Fluid Involvement: Malignant cells not identified  Chemotherapy Response Score (CRS): Not applicable, no known presurgical therapy  Regional Lymph Nodes:       Number of Nodes with Metastasis Greater than 10 mm: 0  Number of Nodes with Metastasis 10 mm or Less (excludes isolated  tumor cells): 0       Number of Nodes with Isolated Tumor Cells (0.2 mm or less): 0       Number of Lymph Nodes Examined: 19  Distant Metastasis:       Distant Site(s) Involved: Not applicable  Pathologic Stage Classification (pTNM, AJCC 8th Edition): pT1a, pN0  Ancillary Studies: Can be performed  upon request  Representative Tumor Block: A8  Comment(s): Dr. Vic Ripper reviewed the case and agrees with the above diagnosis.     12/03/2020 Surgery   Surgeon: Donaciano Eva    Operation: Robotic-assisted laparoscopic total hysterectomy with bilateral salpingoophorectomy, omentectomy, pelvic and para-aortic lymphadnectomy.     Surgeon: Donaciano Eva    Operative Findings:  : 20cm cystic mass, smooth. Attachments between inferior aspect of cystic mass and posterior pelvic peritoneum. Normal appearing omentum and nodes. Invasive malignancy on evaluation. No gross extra-ovarian disease. Clinical stage I disease. No gross residual disease.    12/11/2020 Cancer Staging   Staging form: Ovary, Fallopian Tube, and Primary Peritoneal Carcinoma, AJCC 8th Edition - Pathologic stage from 12/11/2020: FIGO Stage IA (pT1a, pN0, cM0) - Signed by [name], MD on 12/11/2020 Stage prefix: Initial diagnosis   01/10/2021 Genetic Testing   Negative germline and somatic genetic testing: no pathogenic variants detected in [employer] My[facility] Panel.   No somatic variants detected in [employer] [facility] HRD testing.  Genomic instaiblity score is negative.  The report dates are January 10, 2021.    The [facility] gene panel offered by [employer] includes sequencing and deletion/duplication testing of the following 48 genes: APC, ATM, AXIN2, BAP1, BARD1, BMPR1A, BRCA1, BRCA2, BRIP1, CHD1, CDK4, CDKN2A(p16 and p14ARF), CHEK2, CTNNA1, EGFR, EPCAM, FH, FLCN, GREM1, HOXB13, MEN1, MET, MITF , MLH1, MSH2, MSH3, MSH6, MUTYH, NTHL1, PALB2, PMS2, POLD1, POLE, PTEN, RAD51C, RAD51D, RET, SDHA, SDHB, SDHC, SDHD, SMAD4, STK11,TERT, TP53, TSC1, TSC2, and VHL.    01/15/2021 - 03/17/2021 Chemotherapy   Patient is on Treatment Plan : OVARIAN Carboplatin (AUC 6) / Paclitaxel (175) q21d x 6 cycles     04/15/2021 Tumor Marker   Patient's tumor was tested for the following markers: Ca-125. Results of the tumor marker test  revealed 7.7.   04/16/2021 Imaging   1. New left periaortic adenopathy and new left omental and mesenteric root nodules, indicative of metastatic disease. 2. Right lower lobe nodules are stable. 3. Hepatic steatosis. 4. Cholelithiasis. 5. Left renal stone. 6.  Aortic atherosclerosis (ICD10-I70.0).   06/17/2021 Imaging   1. Small RIGHT retroperitoneal nodule not significant changed. Recommend attention on follow-up. 2. Two low-density para-aortic lymph nodes unchanged from comparison exam. 3. No clear evidence omental or peritoneal metastasis. 4. Stable small pulmonary nodule in the RIGHT lower lobe.     06/19/2021 Tumor Marker   Patient's tumor was tested for the following markers: CA-125. Results of the tumor marker test revealed 6.3.   12/17/2021 Imaging   Stable small low-attenuation retroperitoneal lymph nodes in left para-aortic region. No new or progressive disease within the abdomen or pelvis.   Stable 6 mm polypoid density along the nondependent gallbladder wall consistent with gallbladder polyp. Recommend continued imaging follow-up with ultrasound in 6-12 months.   Stable tiny nonobstructing left renal calculus.   Mild increase in size of midline paraumbilical hernia, which contains only fat.   Aortic Atherosclerosis (ICD10-I70.0).   12/17/2021 Tumor Marker   Patient's tumor was tested for the following markers: CA-125.  Results of the tumor marker test revealed 6.8.   03/19/2022 Tumor Marker   Patient's tumor was tested for the following markers: CA-125. Results of the tumor marker test revealed 6.5.   06/22/2022 Tumor Marker   Patient's tumor was tested for the following markers: CA-125. Results of the tumor marker test revealed 6.3.   09/18/2022 Tumor Marker   Patient's tumor was tested for the following markers: CA-125. Results of the tumor marker test revealed 6.3.   09/18/2022 Imaging   CT ABDOMEN PELVIS W CONTRAST  Result Date: 09/18/2022 CLINICAL DATA:  Ovarian  carcinoma. Chemotherapy completed November 2022. * Tracking Code: BO * EXAM: CT ABDOMEN AND PELVIS WITH CONTRAST TECHNIQUE: Multidetector CT imaging of the abdomen and pelvis was performed using the standard protocol following bolus administration of intravenous contrast. RADIATION DOSE REDUCTION: This exam was performed according to the departmental dose-optimization program which includes automated exposure control, adjustment of the mA and/or kV according to patient size and/or use of iterative reconstruction technique. CONTRAST:  OMNIPAQUE IOHEXOL 300 MG/ML  SOLN COMPARISON:  None Available. FINDINGS: Lower chest: Lung bases are clear. Hepatobiliary: No focal hepatic lesion. 6 mm polypoid lesion within the gallbladder not changed from prior (image 27/series 9). No biliary duct dilatation. Common bile duct is normal. Pancreas: Pancreas is normal. No ductal dilatation. No pancreatic inflammation. Spleen: Normal spleen Adrenals/urinary tract: Adrenal glands and kidneys are normal. The ureters and bladder normal. Stomach/Bowel: Stomach, small bowel, appendix, and cecum are normal. The colon and rectosigmoid colon are normal. Vascular/Lymphatic: Abdominal aorta is normal caliber. No periportal or retroperitoneal adenopathy. No pelvic adenopathy. Reproductive: Post hysterectomy. Post oophorectomy. No pelvic sidewall nodularity. No adnexal nodularity. No peritoneal nodularity. No fluid within the abdomen pelvis. Other: Low-attenuation lesion in the retroperitoneum LEFT of the aortic bifurcation is decreased in size measuring 4 mm (image 50/series 9) compared to 13 mm on prior. Musculoskeletal: No aggressive osseous lesion. IMPRESSION: 1. No evidence of ovarian cancer recurrence or metastasis. 2. Interval decrease in size of low-attenuation lesion in the retroperitoneum LEFT of the aortic bifurcation. 3. Stable polypoid lesion in the gallbladder. Electronically Signed   By: Genevive Bi M.D.   On: 09/18/2022  08:24        Interval History: Doing well.  Denies any abdominal or pelvic pain.  Denies any vaginal bleeding or discharge.  Reports bowels returned to normal after her colonoscopy.  Denies any urinary symptoms.  Past Medical/Surgical History: Past Medical History:  Diagnosis Date   Bradycardia    Chicken pox    Elevated blood pressure reading    Epigastric abdominal pain    Family history of breast cancer 12/26/2020   Family history of prostate cancer 12/26/2020   Heart murmur    since childhood- not always heard   Hyperglycemia    Hyperlipidemia    borderline- no meds   Hypothyroidism (acquired)    Lightheaded    ovarian ca 11/2020   Pre-diabetes    Vitamin D deficiency     Past Surgical History:  Procedure Laterality Date   CESAREAN SECTION     x2   cosmetic knee surgery     as child   LAPAROTOMY N/A 12/03/2020   Procedure: MINI LAPAROTOMY FOR CYST DECOMPRESSION;  Surgeon: Adolphus Birchwood, MD;  Location: [facility];  Service: Gynecology;  Laterality: N/A;   ROBOTIC ASSISTED TOTAL HYSTERECTOMY WITH BILATERAL SALPINGO OOPHERECTOMY N/A 12/03/2020   Procedure: XI ROBOTIC ASSISTED TOTAL HYSTERECTOMY WITH BILATERAL SALPINGO OOPHORECTOMY WITH PARA-AORTIC AND PELVIC LYMPHADNECTOMY  AND OMENTECTOMY;  Surgeon: Adolphus Birchwood, MD;  Location: [facility];  Service: Gynecology;  Laterality: N/A;    Family History  Problem Relation Age of Onset   Elevated Lipids Mother    Prostate cancer Father        dx 12s   Elevated Lipids Father    Heart disease Father        mid 47s. had been remote smoker.    Hypertension Father    Diabetes Father        mid 53s   Dementia Father        vascular based   Skin cancer Father        SCC, dx after 43   Healthy Brother    Breast cancer Maternal Aunt 50   Prostate cancer Maternal Uncle        dx 16s   Lymphoma Paternal Uncle        dx after 78   Diabetes Maternal Grandmother    Arthritis Maternal Grandfather    Diabetes Paternal Grandfather     Cancer Cousin        unknown type; ?throat ?lymphoma d. 86   Colon cancer Neg Hx    Colon polyps Neg Hx    Esophageal cancer Neg Hx    Rectal cancer Neg Hx    Stomach cancer Neg Hx     Social History   Socioeconomic History   Marital status: Married    Spouse name: Not on file   Number of children: 2   Years of education: Not on file   Highest education level: Not on file  Occupational History   Occupation: realtor  Tobacco Use   Smoking status: Never   Smokeless tobacco: Never  Vaping Use   Vaping Use: Never used  Substance and Sexual Activity   Alcohol use: Yes    Comment: rare   Drug use: No   Sexual activity: Yes    Partners: Male    Comment: husband  Other Topics Concern   Not on file  Social History Narrative   Married 30 years October 2020. Daughter went to [place]. Son went to [place]. 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       Hobbies: tennis, plays mahjong in womens group      Social Determinants of Health   Financial Resource Strain: Not on file  Food Insecurity: Not on file  Transportation Needs: Not on file  Physical Activity: Not on file  Stress: Not on file  Social Connections: Not on file    Current Medications:  Current Outpatient Medications:    amLODipine (NORVASC) 5 MG tablet, Take 1 tablet (5 mg total) by mouth daily., Disp: 90 tablet, Rfl: 2   calcium carbonate (TUMS - DOSED IN MG ELEMENTAL CALCIUM) 500 MG chewable tablet, Chew 1 tablet by mouth daily., Disp: , Rfl:    Cholecalciferol (VITAMIN D3) 1.25 MG (50000 UT) CAPS, Vitamin D3  5,000 iu qd, Disp: , Rfl:    fluorometholone (FML) 0.1 % ophthalmic suspension, 1 drop 4 (four) times daily., Disp: , Rfl:    levothyroxine (SYNTHROID) 50 MCG tablet, TAKE 1 TABLET BY MOUTH EVERY DAY, Disp: 90 tablet, Rfl: 3   linaclotide (LINZESS) 145 MCG CAPS capsule, Take 1  capsule (145 mcg total) by mouth daily before breakfast. (Patient taking differently: Take 145 mcg by mouth  as needed.), Disp: 90 capsule, Rfl: 1   rosuvastatin (CRESTOR) 5 MG tablet, Take 1 tablet (5 mg total) by mouth daily., Disp: 90 tablet, Rfl: 3   vitamin B-12 (CYANOCOBALAMIN) 1000 MCG tablet, Take 1,000 mcg by mouth in the morning., Disp: , Rfl:    vitamin C (ASCORBIC ACID) 500 MG tablet, Take 500 mg by mouth in the morning., Disp: , Rfl:   Review of Systems: Denies appetite changes, fevers, chills, fatigue, unexplained weight changes. Denies hearing loss, neck lumps or masses, mouth sores, ringing in ears or voice changes. Denies cough or wheezing.  Denies shortness of breath. Denies chest pain or palpitations. Denies leg swelling. Denies abdominal distention, pain, blood in stools, constipation, diarrhea, nausea, vomiting, or early satiety. Denies pain with intercourse, dysuria, frequency, hematuria or incontinence. Denies hot flashes, pelvic pain, vaginal bleeding or vaginal discharge.   Denies joint pain, back pain or muscle pain/cramps. Denies itching, rash, or wounds. Denies dizziness, headaches, numbness or seizures. Denies swollen lymph nodes or glands, denies easy bruising or bleeding. Denies anxiety, depression, confusion, or decreased concentration.  Physical Exam: BP (!) 146/66 (BP Location: Left Arm, Patient Position: Sitting)   Pulse 62   Temp 98.6 F (37 C) (Oral)   Wt 180 lb 6.4 oz (81.8 kg)   LMP 05/11/2014   SpO2 98%   BMI 32.47 kg/m  General: Alert, oriented, no acute distress. HEENT: Cephalic, atraumatic, sclera anicteric. Chest: Clear to auscultation bilaterally.  No wheezes or rhonchi. Cardiovascular: Regular rate and rhythm, no murmurs. Abdomen: soft, nontender.  Normoactive bowel sounds.  No masses or hepatosplenomegaly appreciated.  Well-healed incisions. Extremities: Grossly normal range of motion.  Warm, well perfused.  No edema bilaterally. Skin:  No rashes or lesions noted. Lymphatics: No cervical, supraclavicular, or inguinal adenopathy. GU: Normal appearing external genitalia without erythema, excoriation, or lesions.  Speculum exam reveals mildly atrophic vaginal mucosa, polypoid beefy red <1 cm lesion noted along the cuff in the midline into the right (previously biopsied).  No bleeding or discharge.  Bimanual exam reveals cuff intact, lesion described above is palpable although not firm or nodular.  No nodularity or masses.  Rectovaginal exam confirms these findings.  Laboratory & Radiologic Studies: Component Ref Range & Units 2 d ago (09/16/22) 3 mo ago (06/19/22) 6 mo ago (03/18/22) 9 mo ago (12/15/21) 1 yr ago (09/15/21) 1 yr ago (06/19/21) 1 yr ago (04/14/21)  Cancer Antigen (CA) 125 0.0 - 38.1 U/mL 6.3 6.3 CM 6.5 CM 6.8 CM 6.3 CM 6.3 CM 7.7 C   CT A/P on 09/16/22: 1. No evidence of ovarian cancer recurrence or metastasis. 2. Interval decrease in size of low-attenuation lesion in the retroperitoneum LEFT of the aortic bifurcation. 3. Stable polypoid lesion in the gallbladder.  Assessment & Plan: Brooke Khan is a 57 y.o. 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  Completed 4 cycles of adjuvant chemotherapy. Negative genetic testing.   Patient is overall doing well and is NED on exam today.  She recently had a CA125 drawn which was normal.   Reviewed her recent CT scan.  No evidence of metastatic disease.  Area that we have been following near the left side of the aorta has decreased in size, overall very reassuring.  We had previously discussed that if imaging was stable or improved that we would likely move to imaging directed by change in symptoms, exam findings or increase in CA125.  We will continue with visits every 3 months.  The patient sees Dr. Bertis Ruddy in August.  I will see her back in November.  20 minutes of total time was spent for  this patient encounter, including preparation, face-to-face counseling with the patient and coordination of care, and documentation of the encounter.  Eugene Garnet, MD  Division of Gynecologic Oncology  Department of Obstetrics and Gynecology  [facility]

## 2022-09-25 ENCOUNTER — Ambulatory Visit: Payer: BC Managed Care – PPO | Admitting: Gynecologic Oncology

## 2022-10-11 ENCOUNTER — Encounter: Payer: Self-pay | Admitting: Cardiology

## 2022-10-13 DIAGNOSIS — Z1272 Encounter for screening for malignant neoplasm of vagina: Secondary | ICD-10-CM | POA: Diagnosis not present

## 2022-10-13 DIAGNOSIS — Z01419 Encounter for gynecological examination (general) (routine) without abnormal findings: Secondary | ICD-10-CM | POA: Diagnosis not present

## 2022-10-13 DIAGNOSIS — Z6831 Body mass index (BMI) 31.0-31.9, adult: Secondary | ICD-10-CM | POA: Diagnosis not present

## 2022-10-13 DIAGNOSIS — Z1151 Encounter for screening for human papillomavirus (HPV): Secondary | ICD-10-CM | POA: Diagnosis not present

## 2022-10-14 LAB — HM PAP SMEAR: HPV, high-risk: NEGATIVE

## 2022-10-21 ENCOUNTER — Other Ambulatory Visit: Payer: Self-pay | Admitting: Cardiology

## 2022-10-21 DIAGNOSIS — Z79899 Other long term (current) drug therapy: Secondary | ICD-10-CM

## 2022-10-21 MED ORDER — VALSARTAN 160 MG PO TABS
160.0000 mg | ORAL_TABLET | Freq: Every day | ORAL | 3 refills | Status: DC
Start: 2022-10-21 — End: 2022-12-29

## 2022-10-25 ENCOUNTER — Emergency Department (HOSPITAL_COMMUNITY): Payer: BC Managed Care – PPO | Admitting: Anesthesiology

## 2022-10-25 ENCOUNTER — Encounter (HOSPITAL_COMMUNITY): Payer: Self-pay

## 2022-10-25 ENCOUNTER — Emergency Department (HOSPITAL_COMMUNITY): Payer: BC Managed Care – PPO

## 2022-10-25 ENCOUNTER — Encounter (HOSPITAL_COMMUNITY)
Admission: EM | Disposition: A | Discharge status: Home / Self Care | Payer: Self-pay | Source: Home / Self Care | Attending: Emergency Medicine

## 2022-10-25 ENCOUNTER — Other Ambulatory Visit: Payer: Self-pay

## 2022-10-25 ENCOUNTER — Ambulatory Visit (HOSPITAL_COMMUNITY)
Admission: EM | Admit: 2022-10-25 | Discharge: 2022-10-25 | Disposition: A | Discharge status: Home / Self Care | Payer: BC Managed Care – PPO | Attending: Emergency Medicine | Admitting: Emergency Medicine

## 2022-10-25 DIAGNOSIS — R918 Other nonspecific abnormal finding of lung field: Secondary | ICD-10-CM | POA: Diagnosis not present

## 2022-10-25 DIAGNOSIS — K353 Acute appendicitis with localized peritonitis, without perforation or gangrene: Secondary | ICD-10-CM | POA: Diagnosis not present

## 2022-10-25 DIAGNOSIS — Z79899 Other long term (current) drug therapy: Secondary | ICD-10-CM | POA: Diagnosis not present

## 2022-10-25 DIAGNOSIS — Z8543 Personal history of malignant neoplasm of ovary: Secondary | ICD-10-CM | POA: Diagnosis not present

## 2022-10-25 DIAGNOSIS — E785 Hyperlipidemia, unspecified: Secondary | ICD-10-CM | POA: Diagnosis not present

## 2022-10-25 DIAGNOSIS — C801 Malignant (primary) neoplasm, unspecified: Secondary | ICD-10-CM | POA: Diagnosis not present

## 2022-10-25 DIAGNOSIS — I10 Essential (primary) hypertension: Secondary | ICD-10-CM | POA: Diagnosis not present

## 2022-10-25 DIAGNOSIS — C7960 Secondary malignant neoplasm of unspecified ovary: Secondary | ICD-10-CM | POA: Diagnosis not present

## 2022-10-25 DIAGNOSIS — D72829 Elevated white blood cell count, unspecified: Secondary | ICD-10-CM | POA: Diagnosis not present

## 2022-10-25 DIAGNOSIS — E860 Dehydration: Secondary | ICD-10-CM | POA: Insufficient documentation

## 2022-10-25 DIAGNOSIS — K3580 Unspecified acute appendicitis: Secondary | ICD-10-CM | POA: Diagnosis not present

## 2022-10-25 DIAGNOSIS — F419 Anxiety disorder, unspecified: Secondary | ICD-10-CM | POA: Insufficient documentation

## 2022-10-25 HISTORY — PX: LAPAROSCOPIC APPENDECTOMY: SHX408

## 2022-10-25 LAB — URINALYSIS, ROUTINE W REFLEX MICROSCOPIC
Bilirubin Urine: NEGATIVE
Glucose, UA: NEGATIVE mg/dL
Hgb urine dipstick: NEGATIVE
Ketones, ur: NEGATIVE mg/dL
Leukocytes,Ua: NEGATIVE
Nitrite: NEGATIVE
Protein, ur: 30 mg/dL — AB
Specific Gravity, Urine: 1.021 (ref 1.005–1.030)
pH: 6 (ref 5.0–8.0)

## 2022-10-25 LAB — CBC
HCT: 42.8 % (ref 36.0–46.0)
Hemoglobin: 14.3 g/dL (ref 12.0–15.0)
MCH: 30.8 pg (ref 26.0–34.0)
MCHC: 33.4 g/dL (ref 30.0–36.0)
MCV: 92.2 fL (ref 80.0–100.0)
Platelets: 267 10*3/uL (ref 150–400)
RBC: 4.64 MIL/uL (ref 3.87–5.11)
RDW: 12.8 % (ref 11.5–15.5)
WBC: 13.6 10*3/uL — ABNORMAL HIGH (ref 4.0–10.5)
nRBC: 0 % (ref 0.0–0.2)

## 2022-10-25 LAB — COMPREHENSIVE METABOLIC PANEL
ALT: 31 U/L (ref 0–44)
AST: 25 U/L (ref 15–41)
Albumin: 4.1 g/dL (ref 3.5–5.0)
Alkaline Phosphatase: 70 U/L (ref 38–126)
Anion gap: 11 (ref 5–15)
BUN: 17 mg/dL (ref 6–20)
CO2: 25 mmol/L (ref 22–32)
Calcium: 9.5 mg/dL (ref 8.9–10.3)
Chloride: 102 mmol/L (ref 98–111)
Creatinine, Ser: 0.78 mg/dL (ref 0.44–1.00)
GFR, Estimated: >60 mL/min (ref >60)
Glucose, Bld: 144 mg/dL — ABNORMAL HIGH (ref 70–99)
Potassium: 3.7 mmol/L (ref 3.5–5.1)
Sodium: 138 mmol/L (ref 135–145)
Total Bilirubin: 0.6 mg/dL (ref 0.3–1.2)
Total Protein: 7.2 g/dL (ref 6.5–8.1)

## 2022-10-25 LAB — TROPONIN I (HIGH SENSITIVITY): Troponin I (High Sensitivity): 7 ng/L (ref <18)

## 2022-10-25 LAB — LIPASE, BLOOD: Lipase: 24 U/L (ref 11–51)

## 2022-10-25 SURGERY — APPENDECTOMY, LAPAROSCOPIC
Anesthesia: General

## 2022-10-25 MED ORDER — LACTATED RINGERS IV BOLUS
500.0000 mL | Freq: Once | INTRAVENOUS | Status: AC
  Administered 2022-10-25: 500 mL via INTRAVENOUS

## 2022-10-25 MED ORDER — FENTANYL CITRATE (PF) 250 MCG/5ML IJ SOLN
INTRAMUSCULAR | Status: AC
  Filled 2022-10-25: qty 5

## 2022-10-25 MED ORDER — OXYCODONE HCL 5 MG PO TABS: 5.0000 mg | ORAL_TABLET | ORAL | 0 refills | Status: DC | PRN

## 2022-10-25 MED ORDER — PROPOFOL 10 MG/ML IV BOLUS
INTRAVENOUS | Status: DC | PRN
  Administered 2022-10-25: 100 mg via INTRAVENOUS

## 2022-10-25 MED ORDER — FENTANYL CITRATE (PF) 100 MCG/2ML IJ SOLN: 25.0000 ug | INTRAMUSCULAR | Status: DC | PRN

## 2022-10-25 MED ORDER — OXYCODONE HCL 5 MG PO TABS: 5.0000 mg | ORAL_TABLET | Freq: Once | ORAL | Status: DC | PRN

## 2022-10-25 MED ORDER — BUPIVACAINE LIPOSOME 1.3 % IJ SUSP
INTRAMUSCULAR | Status: AC
  Filled 2022-10-25: qty 20

## 2022-10-25 MED ORDER — DOCUSATE SODIUM 100 MG PO CAPS: 100.0000 mg | ORAL_CAPSULE | Freq: Two times a day (BID) | ORAL | 2 refills | Status: DC

## 2022-10-25 MED ORDER — EPHEDRINE SULFATE-NACL 50-0.9 MG/10ML-% IV SOSY
PREFILLED_SYRINGE | INTRAVENOUS | Status: DC | PRN
  Administered 2022-10-25: 10 mg via INTRAVENOUS

## 2022-10-25 MED ORDER — MIDAZOLAM HCL 2 MG/2ML IJ SOLN
INTRAMUSCULAR | Status: DC | PRN
  Administered 2022-10-25: 2 mg via INTRAVENOUS

## 2022-10-25 MED ORDER — DEXAMETHASONE SODIUM PHOSPHATE 10 MG/ML IJ SOLN
INTRAMUSCULAR | Status: DC | PRN
  Administered 2022-10-25: 10 mg via INTRAVENOUS

## 2022-10-25 MED ORDER — LACTATED RINGERS IV SOLN: INTRAVENOUS | Status: DC

## 2022-10-25 MED ORDER — EPHEDRINE SULFATE (PRESSORS) 50 MG/ML IJ SOLN: INTRAMUSCULAR | Status: DC | PRN

## 2022-10-25 MED ORDER — 0.9 % SODIUM CHLORIDE (POUR BTL) OPTIME
TOPICAL | Status: DC | PRN
  Administered 2022-10-25: 1000 mL

## 2022-10-25 MED ORDER — METHOCARBAMOL 750 MG PO TABS: 750.0000 mg | ORAL_TABLET | Freq: Four times a day (QID) | ORAL | 1 refills | Status: DC

## 2022-10-25 MED ORDER — ONDANSETRON HCL 4 MG/2ML IJ SOLN
4.0000 mg | Freq: Once | INTRAMUSCULAR | Status: AC
  Administered 2022-10-25: 4 mg via INTRAVENOUS
  Filled 2022-10-25: qty 2

## 2022-10-25 MED ORDER — SODIUM CHLORIDE 0.9 % IV SOLN
2.0000 g | Freq: Once | INTRAVENOUS | Status: AC
  Administered 2022-10-25: 2 g via INTRAVENOUS
  Filled 2022-10-25: qty 20

## 2022-10-25 MED ORDER — CHLORHEXIDINE GLUCONATE 0.12 % MT SOLN
15.0000 mL | Freq: Once | OROMUCOSAL | Status: AC
  Administered 2022-10-25: 15 mL via OROMUCOSAL

## 2022-10-25 MED ORDER — METRONIDAZOLE 500 MG/100ML IV SOLN
500.0000 mg | Freq: Once | INTRAVENOUS | Status: AC
  Administered 2022-10-25: 500 mg via INTRAVENOUS
  Filled 2022-10-25: qty 100

## 2022-10-25 MED ORDER — ONDANSETRON HCL 4 MG/2ML IJ SOLN: 4.0000 mg | Freq: Four times a day (QID) | INTRAMUSCULAR | Status: DC | PRN

## 2022-10-25 MED ORDER — IOHEXOL 350 MG/ML SOLN
75.0000 mL | Freq: Once | INTRAVENOUS | Status: AC | PRN
  Administered 2022-10-25: 75 mL via INTRAVENOUS

## 2022-10-25 MED ORDER — FENTANYL CITRATE (PF) 250 MCG/5ML IJ SOLN
INTRAMUSCULAR | Status: DC | PRN
  Administered 2022-10-25: 100 ug via INTRAVENOUS

## 2022-10-25 MED ORDER — MIDAZOLAM HCL 2 MG/2ML IJ SOLN
INTRAMUSCULAR | Status: AC
  Filled 2022-10-25: qty 2

## 2022-10-25 MED ORDER — SUCCINYLCHOLINE CHLORIDE 200 MG/10ML IV SOSY
PREFILLED_SYRINGE | INTRAVENOUS | Status: DC | PRN
  Administered 2022-10-25: 140 mg via INTRAVENOUS

## 2022-10-25 MED ORDER — LIDOCAINE 2% (20 MG/ML) 5 ML SYRINGE
INTRAMUSCULAR | Status: DC | PRN
  Administered 2022-10-25: 80 mg via INTRAVENOUS

## 2022-10-25 MED ORDER — FENTANYL CITRATE PF 50 MCG/ML IJ SOSY: 25.0000 ug | PREFILLED_SYRINGE | INTRAMUSCULAR | Status: DC | PRN

## 2022-10-25 MED ORDER — ORAL CARE MOUTH RINSE: 15.0000 mL | Freq: Once | OROMUCOSAL | Status: AC

## 2022-10-25 MED ORDER — LORAZEPAM 2 MG/ML IJ SOLN
1.0000 mg | Freq: Once | INTRAMUSCULAR | Status: AC
  Administered 2022-10-25: 1 mg via INTRAVENOUS
  Filled 2022-10-25: qty 1

## 2022-10-25 MED ORDER — ACETAMINOPHEN 500 MG PO TABS: 1000.0000 mg | ORAL_TABLET | Freq: Four times a day (QID) | ORAL | 3 refills | Status: AC

## 2022-10-25 MED ORDER — ONDANSETRON HCL 4 MG/2ML IJ SOLN
INTRAMUSCULAR | Status: DC | PRN
  Administered 2022-10-25: 4 mg via INTRAVENOUS

## 2022-10-25 MED ORDER — BUPIVACAINE HCL (PF) 0.25 % IJ SOLN
INTRAMUSCULAR | Status: DC | PRN
  Administered 2022-10-25: 20 mL

## 2022-10-25 MED ORDER — IBUPROFEN 600 MG PO TABS: 600.0000 mg | ORAL_TABLET | Freq: Four times a day (QID) | ORAL | 1 refills | Status: DC

## 2022-10-25 MED ORDER — OXYCODONE HCL 5 MG/5ML PO SOLN: 5.0000 mg | Freq: Once | ORAL | Status: DC | PRN

## 2022-10-25 MED ORDER — MORPHINE SULFATE (PF) 2 MG/ML IV SOLN
2.0000 mg | Freq: Once | INTRAVENOUS | Status: AC
  Administered 2022-10-25: 2 mg via INTRAVENOUS
  Filled 2022-10-25: qty 1

## 2022-10-25 MED ORDER — CHLORHEXIDINE GLUCONATE 0.12 % MT SOLN
OROMUCOSAL | Status: AC
  Filled 2022-10-25: qty 15

## 2022-10-25 MED ORDER — BUPIVACAINE HCL (PF) 0.25 % IJ SOLN
INTRAMUSCULAR | Status: AC
  Filled 2022-10-25: qty 30

## 2022-10-25 MED ORDER — BUPIVACAINE LIPOSOME 1.3 % IJ SUSP
INTRAMUSCULAR | Status: DC | PRN
  Administered 2022-10-25: 20 mL

## 2022-10-25 SURGICAL SUPPLY — 42 items
ADH SKN CLS APL DERMABOND .7 (GAUZE/BANDAGES/DRESSINGS) ×1
ADH SKN CLS LQ APL DERMABOND (GAUZE/BANDAGES/DRESSINGS) ×1
APL PRP STRL LF DISP 70% ISPRP (MISCELLANEOUS) ×1
BAG COUNTER SPONGE SURGICOUNT (BAG) ×1 IMPLANT
BAG SPEC RTRVL 10 TROC 200 (ENDOMECHANICALS) ×1
BAG SPNG CNTER NS LX DISP (BAG) ×1
CANISTER SUCT 3000ML PPV (MISCELLANEOUS) IMPLANT
CHLORAPREP W/TINT 26 (MISCELLANEOUS) ×1 IMPLANT
COVER SURGICAL LIGHT HANDLE (MISCELLANEOUS) ×1 IMPLANT
CUTTER FLEX LINEAR 45M (STAPLE) ×1 IMPLANT
DERMABOND ADVANCED .7 DNX12 (GAUZE/BANDAGES/DRESSINGS) IMPLANT
DERMABOND ADVANCED .7 DNX6 (GAUZE/BANDAGES/DRESSINGS) ×1 IMPLANT
ELECT CAUTERY BLADE 6.4 (BLADE) ×1 IMPLANT
ELECT REM PT RETURN 9FT ADLT (ELECTROSURGICAL) ×1
ELECTRODE REM PT RTRN 9FT ADLT (ELECTROSURGICAL) ×1 IMPLANT
GLOVE BIO SURGEON STRL SZ 6.5 (GLOVE) ×1 IMPLANT
GLOVE BIOGEL PI IND STRL 6 (GLOVE) ×1 IMPLANT
GOWN STRL REUS W/ TWL LRG LVL3 (GOWN DISPOSABLE) ×3 IMPLANT
GOWN STRL REUS W/TWL LRG LVL3 (GOWN DISPOSABLE) ×2
IRRIG SUCT STRYKERFLOW 2 WTIP (MISCELLANEOUS)
IRRIGATION SUCT STRKRFLW 2 WTP (MISCELLANEOUS) IMPLANT
KIT BASIN OR (CUSTOM PROCEDURE TRAY) ×1 IMPLANT
KIT TURNOVER KIT B (KITS) ×1 IMPLANT
NS IRRIG 1000ML POUR BTL (IV SOLUTION) ×1 IMPLANT
PAD ARMBOARD 7.5X6 YLW CONV (MISCELLANEOUS) ×2 IMPLANT
PENCIL BUTTON HOLSTER BLD 10FT (ELECTRODE) ×1 IMPLANT
POUCH RETRIEVAL ECOSAC 10 (ENDOMECHANICALS) IMPLANT
RELOAD 45 VASCULAR/THIN (ENDOMECHANICALS) ×1 IMPLANT
RELOAD STAPLE 45 2.5 WHT GRN (ENDOMECHANICALS) ×1 IMPLANT
RELOAD STAPLE 45 3.5 BLU ETS (ENDOMECHANICALS) ×1 IMPLANT
RELOAD STAPLE TA45 3.5 REG BLU (ENDOMECHANICALS) ×1 IMPLANT
SET TUBE SMOKE EVAC HIGH FLOW (TUBING) ×1 IMPLANT
SLEEVE Z-THREAD 5X100MM (TROCAR) ×1 IMPLANT
SPECIMEN JAR SMALL (MISCELLANEOUS) ×1 IMPLANT
SUT MNCRL AB 4-0 PS2 18 (SUTURE) ×1 IMPLANT
SUT VICRYL 0 UR6 27IN ABS (SUTURE) IMPLANT
TOWEL GREEN STERILE (TOWEL DISPOSABLE) ×1 IMPLANT
TRAY LAPAROSCOPIC MC (CUSTOM PROCEDURE TRAY) ×1 IMPLANT
TROCAR BALLN 12MMX100 BLUNT (TROCAR) IMPLANT
TROCAR Z-THREAD OPTICAL 5X100M (TROCAR) ×1 IMPLANT
WARMER LAPAROSCOPE (MISCELLANEOUS) ×1 IMPLANT
WATER STERILE IRR 1000ML POUR (IV SOLUTION) ×1 IMPLANT

## 2022-10-25 NOTE — ED Provider Notes (Signed)
[facility] AT [facility] Provider Note   CSN: [id number] Arrival date & time: 10/25/22  4098     History  Chief Complaint  Patient presents with   Abdominal Pain   Emesis   Back Pain    Brooke Khan is a 57 y.o. female with past medical history hypertension, hyperlipidemia, ovarian cancer in remission who presents to the ED complaining of 7/10 epigastric abdominal pain that started last night.  Notes that it has been intermittently severe and associated with nausea.  She has had 2 episodes of nonbloody emesis overnight.  Has some pain in the mid back as well but notes that this is minimal and relieved with a heating pad.  No associated fever, chills, diarrhea, dyspnea, cough, or constipation.  Patient had last follow-up with oncology approximately 5 weeks ago with a good report.  No associated urinary symptoms, upper chest pain, syncope, headache, or vision changes. Some mild lightheadedness. No change in PO intake. Family history CAD in father. No personal history CAD.       Home Medications Prior to Admission medications   Medication Sig Start Date End Date Taking? Authorizing Provider  valsartan (DIOVAN) 160 MG tablet Take 1 tablet (160 mg total) by mouth daily. 10/21/22   Meriam Sprague, MD  amLODipine (NORVASC) 5 MG tablet Take 1 tablet (5 mg total) by mouth daily. 02/13/22   Meriam Sprague, MD  calcium carbonate (TUMS - DOSED IN MG ELEMENTAL CALCIUM) 500 MG chewable tablet Chew 1 tablet by mouth daily.    [provider]  Cholecalciferol (VITAMIN D3) 1.25 MG (50000 UT) CAPS Vitamin D3  5,000 iu qd    [provider]  fluorometholone (FML) 0.1 % ophthalmic suspension 1 drop 4 (four) times daily. 09/01/22   [provider]  levothyroxine (SYNTHROID) 50 MCG tablet TAKE 1 TABLET BY MOUTH EVERY DAY 02/13/22   [name], MD  linaclotide [facility]) 145 MCG CAPS capsule Take 1 capsule (145 mcg total) by  mouth daily before breakfast. Patient taking differently: Take 145 mcg by mouth as needed. 01/09/22 01/04/23  Charlie Pitter III, MD  rosuvastatin (CRESTOR) 5 MG tablet Take 1 tablet (5 mg total) by mouth daily. 05/18/22   Meriam Sprague, MD  vitamin B-12 (CYANOCOBALAMIN) 1000 MCG tablet Take 1,000 mcg by mouth in the morning.    [provider]  vitamin C (ASCORBIC ACID) 500 MG tablet Take 500 mg by mouth in the morning.    [provider]      Allergies    Codeine    Review of Systems   Review of Systems  All other systems reviewed and are negative.   Physical Exam Updated Vital Signs BP 124/60   Pulse 74   Temp 98.6 F (37 C) (Oral)   Resp 10   Ht 5\' 2"  (1.575 m)   Wt 78.9 kg   LMP 05/11/2014   SpO2 99%   BMI 31.83 kg/m  Physical Exam Vitals and nursing note reviewed.  Constitutional:      General: She is not in acute distress.    Appearance: Normal appearance. She is not ill-appearing, toxic-appearing or diaphoretic.  HENT:     Head: Normocephalic and atraumatic.     Mouth/Throat:     Comments: Mildly dry mucus membranes Eyes:     General: No scleral icterus.    Extraocular Movements: Extraocular movements intact.     Conjunctiva/sclera: Conjunctivae normal.  Pupils: Pupils are equal, round, and reactive to light.  Cardiovascular:     Rate and Rhythm: Normal rate and regular rhythm.     Heart sounds: No murmur heard. Pulmonary:     Effort: Pulmonary effort is normal.     Breath sounds: Normal breath sounds.  Abdominal:     General: Abdomen is flat. There is no distension.     Palpations: Abdomen is soft. There is no shifting dullness, fluid wave, hepatomegaly, splenomegaly or pulsatile mass.     Tenderness: There is abdominal tenderness in the right upper quadrant and epigastric area. There is no right CVA tenderness, left CVA tenderness, guarding or rebound. Negative signs include Murphy's sign.  Musculoskeletal:        General: Normal  range of motion.     Cervical back: Neck supple.     Right lower leg: No edema.     Left lower leg: No edema.  Skin:    General: Skin is warm and dry.     Capillary Refill: Capillary refill takes less than 2 seconds.     Findings: No rash.  Neurological:     General: No focal deficit present.     Mental Status: She is alert and oriented to person, place, and time. Mental status is at baseline.     Cranial Nerves: Cranial nerves 2-12 are intact.     Sensory: Sensation is intact.     Motor: Motor function is intact.  Psychiatric:        Mood and Affect: Mood is anxious.        Speech: Speech normal.        Behavior: Behavior is cooperative.     ED Results / Procedures / Treatments   Labs (all labs ordered are listed, but only abnormal results are displayed) Labs Reviewed  COMPREHENSIVE METABOLIC PANEL - Abnormal; Notable for the following components:      Result Value   Glucose, Bld 144 (*)    All other components within normal limits  CBC - Abnormal; Notable for the following components:   WBC 13.6 (*)    All other components within normal limits  URINALYSIS, ROUTINE W REFLEX MICROSCOPIC - Abnormal; Notable for the following components:   Protein, ur 30 (*)    Bacteria, UA RARE (*)    All other components within normal limits  LIPASE, BLOOD  TROPONIN I (HIGH SENSITIVITY)    EKG None  Radiology No results found.  Procedures Procedures    Medications Ordered in ED Medications  cefTRIAXone (ROCEPHIN) 2 g in sodium chloride 0.9 % 100 mL IVPB (has no administration in time range)    And  metroNIDAZOLE (FLAGYL) IVPB 500 mg (has no administration in time range)  ondansetron (ZOFRAN) injection 4 mg (4 mg Intravenous Given 10/25/22 1003)  morphine (PF) 2 MG/ML injection 2 mg (2 mg Intravenous Given 10/25/22 1005)  LORazepam (ATIVAN) injection 1 mg (1 mg Intravenous Given 10/25/22 1031)  lactated ringers bolus 500 mL (500 mLs Intravenous New Bag/Given 10/25/22 1003)   iohexol (OMNIPAQUE) 350 MG/ML injection 75 mL (75 mLs Intravenous Contrast Given 10/25/22 1052)    ED Course/ Medical Decision Making/ A&P                             Medical Decision Making Amount and/or Complexity of Data Reviewed Labs: ordered. Decision-making details documented in ED Course. Radiology: ordered. Decision-making details documented in ED Course. ECG/medicine tests: ordered.  Decision-making details documented in ED Course.  Risk Prescription drug management. Decision regarding hospitalization.   Medical Decision Making:   Briggitte Rosenbalm is a 57 y.o. female who presented to the ED today with abdominal pain detailed above.    Additional history discussed with patient's family/caregivers.  Patient's presentation is complicated by their history of ovarian cancer, hypertension.  Complete initial physical exam performed, notably the patient  was with epigastric and right upper quadrant abdominal tenderness.  She did appear comfortable.  Nontoxic-appearing.  No CVA tenderness.    Reviewed and confirmed nursing documentation for past medical history, family history, social history.    Initial Assessment:   With the patient's presentation of abdominal pain, differential diagnosis includes but is not limited to AAA, mesenteric ischemia, appendicitis, diverticulitis, DKA, gastritis, gastroenteritis, AMI, nephrolithiasis, pancreatitis, peritonitis, adrenal insufficiency, intestinal ischemia, constipation, UTI, SBO/LBO, splenic rupture, biliary disease, IBD, IBS, PUD, hepatitis, STD, ovarian/testicular torsion, electrolyte disturbance, DKA, dehydration, acute kidney injury, renal failure, cholecystitis, cholelithiasis, choledocholithiasis, abdominal pain of  unknown etiology, malignancy.   Initial Plan:  Screening labs including CBC and Metabolic panel to evaluate for infectious or metabolic etiology of disease.  Lipase to evaluate for pancreatitis Urinalysis with reflex  culture ordered to evaluate for UTI or relevant urologic/nephrologic pathology.  CT chest/abd/pelvis to evaluate for intra-thoracic/abdominal pathology EKG and troponin to evaluate for cardiac pathology Symptomatic management Objective evaluation as reviewed   Initial Study Results:   Laboratory  All laboratory results reviewed without evidence of clinically relevant pathology.   Exceptions include: WBC 13.6  EKG EKG was reviewed independently. ST segments without concerns for elevations.   EKG: normal EKG, normal sinus rhythm.   Radiology:  All images reviewed independently. Agree with radiology report at this time.   No results found.    Consults: Case discussed with Dr. Bedelia Person with surgery who will come see patient.   Final Assessment and Plan:   57 year old female presents to the ED complaining of epigastric abdominal pain onset last night with nausea and vomiting.  Nontoxic-appearing on exam.  Signs reassuring.  She does appear comfortable.  Able to elicit epigastric and right upper quadrant tenderness on exam.  Negative Murphy sign.  Workup initiated as above for further assessment.  Patient does have a history of ovarian cancer in remission.  Recent scan a few weeks ago which was clear.  She has a minimal leukocytosis.  Afebrile.  Troponin normal.  EKG without ischemic changes.  Low suspicion for ACS.  CT scan shows an acute appendicitis.  On reexamination, patient is comfortable following small dose of morphine but is still having pain.  Does seem to be more localized to the right upper quadrant on reexam.  Consulted general surgery who will evaluate patient and did eventually decide to take patient to the OR today for appendectomy.  Patient/husband aware of names and consult surgery.  Patient stable throughout ED stay.   Clinical Impression:  1. Acute appendicitis with localized peritonitis, without perforation, abscess, or gangrene   2. Dehydration      Admit            Final Clinical Impression(s) / ED Diagnoses Final diagnoses:  Acute appendicitis with localized peritonitis, without perforation, abscess, or gangrene  Dehydration    Rx / DC Orders ED Discharge Orders     None         Tonette Lederer, PA-C 10/25/22 1317    Cathren Laine, MD 10/25/22 1600

## 2022-10-25 NOTE — Anesthesia Procedure Notes (Signed)
Procedure Name: Intubation Date/Time: 10/25/2022 1:28 PM  Performed by: Evlyn Courier, CRNAPre-anesthesia Checklist: Patient identified, Emergency Drugs available, Suction available and Patient being monitored Patient Re-evaluated:Patient Re-evaluated prior to induction Oxygen Delivery Method: Circle System Utilized Preoxygenation: Pre-oxygenation with 100% oxygen Induction Type: IV induction Ventilation: Mask ventilation without difficulty Laryngoscope Size: Mac and 3 Grade View: Grade I Tube type: Oral Tube size: 7.0 mm Number of attempts: 1 Airway Equipment and Method: Stylet and Oral airway Placement Confirmation: ETT inserted through vocal cords under direct vision, positive ETCO2 and breath sounds checked- equal and bilateral Secured at: 22 cm Tube secured with: Tape Dental Injury: Teeth and Oropharynx as per pre-operative assessment

## 2022-10-25 NOTE — Progress Notes (Signed)
Pacu Discharge Note  Patient instuctions were given to family. Wound care, diet, pain, follow up care and how and whom to contact with concerns were discussed. Family aware someone needs to remain with patient overnight and concerns after receiving anesthesia and what to avoid and safety. Answered all questions and concerns.   Pt has lapsites w/ dermabond, discussed how to care for these incisions and when she could take a shower.   Discussed diet for tonight and next few days.   Discharge paperwork has clear contact informations for surgeon and 24 hour RN line for concerns.   Discussed what concerns to look for including infection and signs/symptoms to look for.   IV was removed prior to discharge. Patient was brought to car with belongings.   Pt exits my care.

## 2022-10-25 NOTE — Anesthesia Postprocedure Evaluation (Signed)
Anesthesia Post Note  Patient: [name]  Procedure(s) Performed: APPENDECTOMY LAPAROSCOPIC     Patient location during evaluation: PACU Anesthesia Type: General Level of consciousness: awake and alert Pain management: pain level controlled Vital Signs Assessment: post-procedure vital signs reviewed and stable Respiratory status: spontaneous breathing, nonlabored ventilation, respiratory function stable and patient connected to nasal cannula oxygen Cardiovascular status: blood pressure returned to baseline and stable Postop Assessment: no apparent nausea or vomiting Anesthetic complications: no   No notable events documented.  Last Vitals:  Vitals:   10/25/22 1505 10/25/22 1510  BP:    Pulse: 73 75  Resp: 14 (!) 21  Temp:    SpO2: 95% 94%    Last Pain:  Vitals:   10/25/22 1455  TempSrc:   PainSc: 0-No pain                 Jaira Canady S

## 2022-10-25 NOTE — ED Notes (Signed)
Will give pt ativan before CT scan

## 2022-10-25 NOTE — Transfer of Care (Signed)
Immediate Anesthesia Transfer of Care Note  Patient: Brooke Khan  Procedure(s) Performed: APPENDECTOMY LAPAROSCOPIC  Patient Location: PACU  Anesthesia Type:General  Level of Consciousness: drowsy and patient cooperative  Airway & Oxygen Therapy: Patient connected to face mask  Post-op Assessment: Report given to RN, Post -op Vital signs reviewed and stable, and Patient moving all extremities X 4  Post vital signs: Reviewed and stable  Last Vitals:  Vitals Value Taken Time  BP 127/64 10/25/22 1423  Temp    Pulse 67 10/25/22 1428  Resp 12 10/25/22 1428  SpO2 100 % 10/25/22 1428  Vitals shown include unvalidated device data.  Last Pain:  Vitals:   10/25/22 1257  TempSrc: Oral  PainSc:          Complications: No notable events documented.

## 2022-10-25 NOTE — ED Triage Notes (Signed)
Pt came in via POV d/t n/v with abd pain & mid back pain since last night. States a week ago she was beginning to intermittently feel light headed. Rates 7/10 pain while in triage, A/Ox4, denies hematemesis, Hx of ovarian CA & bradycardia she has followed up with Cardiology on.

## 2022-10-25 NOTE — H&P (Signed)
Reason for Consult/Chief Complaint: appendicitis Consultant: Lonzo Cloud, PA  Brooke Khan is an 57 y.o. female.   HPI: 72F with one day history of central abdominal pain and central back pain associated with vomiting. Last BM either 6/14 or 6/15, described as normal. Last c-scope fall 2023, recs for 7y f/u    Past Medical History:  Diagnosis Date   Bradycardia    Chicken pox    Elevated blood pressure reading    Epigastric abdominal pain    Family history of breast cancer 12/26/2020   Family history of prostate cancer 12/26/2020   Heart murmur    since childhood- not always heard   Hyperglycemia    Hyperlipidemia    borderline- no meds   Hypothyroidism (acquired)    Lightheaded    ovarian ca 11/2020   Pre-diabetes    Vitamin D deficiency     Past Surgical History:  Procedure Laterality Date   CESAREAN SECTION     x2   cosmetic knee surgery     as child   LAPAROTOMY N/A 12/03/2020   Procedure: MINI LAPAROTOMY FOR CYST DECOMPRESSION;  Surgeon: Adolphus Birchwood, MD;  Location: [facility];  Service: Gynecology;  Laterality: N/A;   ROBOTIC ASSISTED TOTAL HYSTERECTOMY WITH BILATERAL SALPINGO OOPHERECTOMY N/A 12/03/2020   Procedure: XI ROBOTIC ASSISTED TOTAL HYSTERECTOMY WITH BILATERAL SALPINGO OOPHORECTOMY WITH PARA-AORTIC AND PELVIC LYMPHADNECTOMY AND OMENTECTOMY;  Surgeon: Adolphus Birchwood, MD;  Location: [facility];  Service: Gynecology;  Laterality: N/A;    Family History  Problem Relation Age of Onset   Elevated Lipids Mother    Prostate cancer Father        dx 78s   Elevated Lipids Father    Heart disease Father        mid 86s. had been remote smoker.    Hypertension Father    Diabetes Father        mid 24s   Dementia Father        vascular based   Skin cancer Father        SCC, dx after 84   Healthy Brother    Breast cancer Maternal Aunt 37   Prostate cancer Maternal Uncle        dx 66s   Lymphoma Paternal Uncle        dx after 33   Diabetes Maternal Grandmother     Arthritis Maternal Grandfather    Diabetes Paternal Grandfather    Cancer Cousin        unknown type; ?throat ?lymphoma d. 14   Colon cancer Neg Hx    Colon polyps Neg Hx    Esophageal cancer Neg Hx    Rectal cancer Neg Hx    Stomach cancer Neg Hx     Social History:  reports that she has never smoked. She has never used smokeless tobacco. She reports current alcohol use. She reports that she does not use drugs.  Allergies:  Allergies  Allergen Reactions   Codeine Nausea Only and Other (See Comments)    "Feel bad"    Medications: I have reviewed the patient's current medications.  Results for orders placed or performed during the hospital encounter of 10/25/22 (from the past 48 hour(s))  Urinalysis, Routine w reflex microscopic -Urine, Clean Catch     Status: Abnormal   Collection Time: 10/25/22  7:31 AM  Result Value Ref Range   Color, Urine YELLOW YELLOW   APPearance CLEAR CLEAR   Specific Gravity, Urine 1.021 1.005 - 1.030  pH 6.0 5.0 - 8.0   Glucose, UA NEGATIVE NEGATIVE mg/dL   Hgb urine dipstick NEGATIVE NEGATIVE   Bilirubin Urine NEGATIVE NEGATIVE   Ketones, ur NEGATIVE NEGATIVE mg/dL   Protein, ur 30 (A) NEGATIVE mg/dL   Nitrite NEGATIVE NEGATIVE   Leukocytes,Ua NEGATIVE NEGATIVE   RBC / HPF 0-5 0 - 5 RBC/hpf   WBC, UA 0-5 0 - 5 WBC/hpf   Bacteria, UA RARE (A) NONE SEEN   Squamous Epithelial / HPF 0-5 0 - 5 /HPF   Mucus PRESENT     Comment: Performed at [facility], [address] [place], [place] [address]  Lipase, blood     Status: None   Collection Time: 10/25/22  7:53 AM  Result Value Ref Range   Lipase 24 11 - 51 U/L    Comment: Performed at [facility], [address] [place], [place] [address]  Comprehensive metabolic panel     Status: Abnormal   Collection Time: 10/25/22  7:53 AM  Result Value Ref Range   Sodium 138 135 - 145 mmol/L   Potassium 3.7 3.5 - 5.1 mmol/L   Chloride 102 98 - 111 mmol/L   CO2 25 22 - 32 mmol/L   Glucose,  Bld 144 (H) 70 - 99 mg/dL    Comment: Glucose reference range applies only to samples taken after fasting for at least 8 hours.   BUN 17 6 - 20 mg/dL   Creatinine, Ser 0.98 0.44 - 1.00 mg/dL   Calcium 9.5 8.9 - 11.9 mg/dL   Total Protein 7.2 6.5 - 8.1 g/dL   Albumin 4.1 3.5 - 5.0 g/dL   AST 25 15 - 41 U/L   ALT 31 0 - 44 U/L   Alkaline Phosphatase 70 38 - 126 U/L   Total Bilirubin 0.6 0.3 - 1.2 mg/dL   GFR, Estimated >14 >78 mL/min    Comment: (NOTE) Calculated using the CKD-EPI Creatinine Equation (2021)    Anion gap 11 5 - 15    Comment: Performed at [facility], [address], [place], [place] [address]  CBC     Status: Abnormal   Collection Time: 10/25/22  7:53 AM  Result Value Ref Range   WBC 13.6 (H) 4.0 - 10.5 K/uL   RBC 4.64 3.87 - 5.11 MIL/uL   Hemoglobin 14.3 12.0 - 15.0 g/dL   HCT 13.0 86.5 - 78.4 %   MCV 92.2 80.0 - 100.0 fL   MCH 30.8 26.0 - 34.0 pg   MCHC 33.4 30.0 - 36.0 g/dL   RDW 69.6 29.5 - 28.4 %   Platelets 267 150 - 400 K/uL   nRBC 0.0 0.0 - 0.2 %    Comment: Performed at [facility], [address], [place], [place] [address]  Troponin I (High Sensitivity)     Status: None   Collection Time: 10/25/22  8:34 AM  Result Value Ref Range   Troponin I (High Sensitivity) 7 <18 ng/L    Comment: (NOTE) Elevated high sensitivity troponin I (hsTnI) values and significant  changes across serial measurements may suggest ACS but many other  chronic and acute conditions are known to elevate hsTnI results.  Refer to the "Links" section for chest pain algorithms and additional  guidance. Performed at [facility], [address], [place], [place] [address]     No results found.  ROS 10 point review of systems is negative except as listed above in HPI.  Physical Exam Blood pressure 124/60, pulse 74, temperature 98.6 F (37 C), temperature source Oral, resp. rate 10, height 5\' 2"  (1.575 m), weight 78.9 kg, last menstrual period 05/11/2014,  SpO2 99 %. Constitutional: well-developed, well-nourished HEENT: pupils equal, round, reactive to light, 2mm b/l, moist conjunctiva, external inspection of ears and nose normal, hearing intact Oropharynx: normal oropharyngeal mucosa, normal dentition Neck: no thyromegaly, trachea midline, no midline cervical tenderness to palpation Chest: breath sounds equal bilaterally, normal respiratory effort, no midline or lateral chest wall tenderness to palpation/deformity Abdomen: soft, central TTP, no bruising, no hepatosplenomegaly GU: normal female genitalia  Rectal: deferred Extremities: 2+ radial and pedal pulses bilaterally, intact motor and sensation bilateral UE and LE, no peripheral edema MSK: unable to assess gait/station, no clubbing/cyanosis of fingers/toes, normal ROM of all four extremities Skin: warm, dry, no rashes Psych: normal memory, normal mood/affect     Assessment/Plan: 23F with acute appendicitis. Plan lap appy. Informed consent was obtained after detailed explanation of risks, including bleeding, infection, abscess, staple line leak, stump appendicitis, injury to surrounding structures, and need for conversion to open procedure. All questions answered to the patient's satisfaction.  Diamantina Monks, MD General and Trauma Surgery [facility] Surgery

## 2022-10-25 NOTE — Anesthesia Preprocedure Evaluation (Signed)
Anesthesia Evaluation  Patient identified by MRN, date of birth, ID band Patient awake    Reviewed: Allergy & Precautions, H&P , NPO status , Patient's Chart, lab work & pertinent test results  Airway Mallampati: II   Neck ROM: full    Dental   Pulmonary neg pulmonary ROS   breath sounds clear to auscultation       Cardiovascular negative cardio ROS  Rhythm:regular Rate:Normal     Neuro/Psych  PSYCHIATRIC DISORDERS Anxiety        GI/Hepatic appendicitis   Endo/Other  Hypothyroidism    Renal/GU      Musculoskeletal   Abdominal   Peds  Hematology   Anesthesia Other Findings   Reproductive/Obstetrics S/p hysterectomy 2022.                             Anesthesia Physical Anesthesia Plan  ASA: 2  Anesthesia Plan: General   Post-op Pain Management:    Induction: Intravenous  PONV Risk Score and Plan: 3 and Ondansetron, Dexamethasone, Midazolam and Treatment may vary due to age or medical condition  Airway Management Planned: Oral ETT  Additional Equipment:   Intra-op Plan:   Post-operative Plan: Extubation in OR  Informed Consent: I have reviewed the patients History and Physical, chart, labs and discussed the procedure including the risks, benefits and alternatives for the proposed anesthesia with the patient or authorized representative who has indicated his/her understanding and acceptance.     Dental advisory given  Plan Discussed with: CRNA, Anesthesiologist and Surgeon  Anesthesia Plan Comments:        Anesthesia Quick Evaluation

## 2022-10-25 NOTE — Op Note (Signed)
   Operative Note   Date: 10/25/2022  Procedure: laparoscopic appendectomy  Pre-op diagnosis: acute appendicitis Post-op diagnosis: Grade 1b appendicitis: moderate to severely inflamed appendix  Indication and clinical history: The patient is a 57 y.o. year old female with appendicitis     Surgeon: Diamantina Monks, MD  Anesthesiologist: Chaney Malling, MD Anesthesia: General  Findings:  Specimen: appendix EBL: <5cc  Disposition: PACU - hemodynamically stable.  Description of procedure: The patient was positioned supine on the operating room table. Time-out was performed verifying correct patient, procedure, signature of informed consent, and administration of pre-operative antibiotics. General anesthetic induction and intubation were uneventful. Foley catheter insertion was not performed as patient voided immediately prior to the procedure . The abdomen was prepped and draped in the usual sterile fashion. An infra-umbilical incision was made using an open technique using zero vicryl stay sutures on either side of the fascia and a 10mm Hassan port inserted. After establishing pneumoperitoneum, which the patient tolerated well, the abdominal cavity was inspected and no injury of any intra-abdominal structures was identified. Two additional five millimeter ports were placed under direct visualization and using local anesthetic in the suprapubic and left lower quadrant regions. The patient was repositioned to Trendelenburg with the left side down. Further inspection of the right lower quadrant revealed no abscess or purulent fluid and grade 1b appendicitis. The appendix was dissected away from its mesoappendix and an endoscopic stapler used to divide the mesoappendix using a vascular load. A bowel load of the endoscopic stapler was used to staple across the appendix at its base. Both staple lines were inspected and found to be intact and without bleeding. The appendix was placed in an endoscopic  specimen retrieval bag, removed via the umbilical port site, and sent to pathology as a permanent specimen. The right lower quadrant was again inspected and hemostasis confirmed. The suprapubic and left lower quadrant ports were removed under direct visualization and hemostasis confirmed. The umbilical port was removed last after desufflating the abdomen and the fascia re-approximated using the stay sutures. Additional local anesthetic was administered at the umbilical incision site. The skin of all port sites was closed with 4-0 monocryl. Sterile dressings were applied. All sponge and instrument counts were correct at the conclusion of the procedure. The patient was awakened from anesthesia, extubated uneventfully, and transported to the PACU in good condition. There were no complications.   Upon entering the abdomen (organ space), I encountered infection of the appendix .  CASE DATA:  Type of patient?: DOW CASE (Surgical Hospitalist [facility])  Status of Case? URGENT Add On  Infection Present At Time Of Surgery (PATOS)?  INFECTION of the appendix   Diamantina Monks, MD General and Trauma Surgery [facility]

## 2022-10-25 NOTE — Discharge Instructions (Addendum)
[facility]  LAPAROSCOPIC SURGERY: POST OP INSTRUCTIONS Always review your discharge instruction sheet given to you by the facility where your surgery was performed. IF YOU HAVE DISABILITY OR FAMILY LEAVE FORMS, YOU MUST BRING THEM TO THE OFFICE FOR PROCESSING.   DO NOT GIVE THEM TO YOUR DOCTOR.  PAIN CONTROL  Pain regimen: take over-the-counter tylenol (acetaminophen) 1000mg  every six hours, the prescription ibuprofen (600mg ) every six hours and the robaxin (methocarbamol) 750mg  every six hours. With all three of these, you should be taking something every two hours. Example: tylenol ( acetaminophen) at 8am, ibuprofen at 10am, robaxin (methocarbamol) at 12pm, tylenol (acetaminophen) again at 2pm, ibuprofen again at 4pm, robaxin (methocarbamol) at 6pm. You also have a prescription for oxycodone, which should be taken if the tylenol (acetaminophen), ibuprofen, and robaxin (methocarbamol) are not enough to control your pain. You may take the oxycodone as frequently as every four hours as needed, but if you are taking the other medications as above, you should not need the oxycodone this frequently. You have also been given a prescription for colace (docusate) which is a stool softener. Please take this as prescribed because the oxycodone can cause constipation and the colace (docusate) will minimize or prevent constipation. Do not drive while taking or under the influence of the oxycodone as it is a narcotic medication. Use ice packs to help control pain. If you need a refill on your pain medication, please contact your pharmacy.  They will contact our office to request authorization. Prescriptions will not be filled after 5pm or on week-ends.  HOME MEDICATIONS Take your usually prescribed medications unless otherwise directed.  DIET You should follow a light diet the first few days after arrival home.  Be sure to include lots of fluids daily.   CONSTIPATION It is common to  experience some constipation after surgery and if you are taking pain medication.  Increasing fluid intake and taking a stool softener (such as Colace) will usually help or prevent this problem from occurring.  A mild laxative (Milk of Magnesia or Miralax) should be taken according to package instructions if there are no bowel movements after 48 hours.  WOUND/INCISION CARE Most patients will experience some swelling and bruising in the area of the incisions.  Ice packs will help.  Swelling and bruising can take several days to resolve.  May shower beginning 10/26/22.  Do not peel off or scrub skin glue. May allow warm soapy water to run over incision, then rinse and pat dry.  Do not soak in any water (tubs, hot tubs, pools, lakes, oceans) for one week.   ACTIVITIES You may resume regular (light) daily activities beginning the next day--such as daily self-care, walking, climbing stairs--gradually increasing activities as tolerated.  You may have sexual intercourse when it is comfortable.   No lifting greater than 5 pounds for six weeks.  You may drive when you are no longer taking narcotic pain medication, you can comfortably wear a seatbelt, and you can safely maneuver your car and apply brakes.  FOLLOW-UP You should see your doctor in the office for a follow-up appointment approximately 2-3 weeks after your surgery.  You should have been given your post-op/follow-up appointment when your surgery was scheduled.  If you did not receive a post-op/follow-up appointment, make sure that you call for this appointment within a day or two after you arrive home to insure a convenient appointment time.  WHEN TO CALL YOUR DOCTOR: Fever over 101.5 Inability to urinate  Continued bleeding from incision. Increased pain, redness, or drainage from the incision. Increasing abdominal pain  The clinic staff is available to answer your questions during regular business hours.  Please don't hesitate to call and ask  to speak to one of the nurses for clinical concerns.  If you have a medical emergency, go to the nearest emergency room or call 911.  A surgeon from [facility] is always on call at the hospital. [address], [address], [place], [place]  [address] ? [address] [address], [place], [place]   [address] [phone] ? [phone] ? FAX [phone] Web site: [email or web address]   

## 2022-10-25 NOTE — ED Notes (Signed)
Ambulatory to bathroom without difficulty.   

## 2022-10-26 ENCOUNTER — Encounter (HOSPITAL_COMMUNITY): Payer: Self-pay | Admitting: Surgery

## 2022-10-27 ENCOUNTER — Encounter: Payer: Self-pay | Admitting: Family Medicine

## 2022-10-27 LAB — SURGICAL PATHOLOGY

## 2022-10-29 ENCOUNTER — Encounter: Payer: Self-pay | Admitting: Family Medicine

## 2022-10-29 MED ORDER — LORAZEPAM 0.5 MG PO TABS: 0.5000 mg | ORAL_TABLET | Freq: Two times a day (BID) | ORAL | 0 refills | Status: DC | PRN

## 2022-11-02 ENCOUNTER — Ambulatory Visit
Admission: RE | Admit: 2022-11-02 | Discharge: 2022-11-02 | Disposition: A | Discharge status: Home / Self Care | Payer: BC Managed Care – PPO | Source: Ambulatory Visit | Attending: Family Medicine | Admitting: Family Medicine

## 2022-11-02 DIAGNOSIS — R519 Headache, unspecified: Secondary | ICD-10-CM

## 2022-11-03 ENCOUNTER — Other Ambulatory Visit: Payer: Self-pay

## 2022-11-03 ENCOUNTER — Telehealth: Payer: Self-pay

## 2022-11-03 DIAGNOSIS — K118 Other diseases of salivary glands: Secondary | ICD-10-CM

## 2022-11-03 NOTE — Telephone Encounter (Signed)
Pt has responded via mychart and seen result note.

## 2022-11-03 NOTE — Telephone Encounter (Signed)
Brooke Khan with [facility] called to make you aware of the MRI Brain that was ordered and the radiologist wanted you to look at the Impression #2 comment specifically.

## 2022-11-03 NOTE — Telephone Encounter (Signed)
See below, referral has been placed.

## 2022-11-03 NOTE — Telephone Encounter (Signed)
See result note.  

## 2022-11-16 ENCOUNTER — Other Ambulatory Visit: Payer: Self-pay

## 2022-11-16 DIAGNOSIS — Z9221 Personal history of antineoplastic chemotherapy: Secondary | ICD-10-CM

## 2022-11-16 DIAGNOSIS — E785 Hyperlipidemia, unspecified: Secondary | ICD-10-CM

## 2022-11-16 DIAGNOSIS — Z79899 Other long term (current) drug therapy: Secondary | ICD-10-CM

## 2022-11-16 MED ORDER — AMLODIPINE BESYLATE 5 MG PO TABS
5.0000 mg | ORAL_TABLET | Freq: Every day | ORAL | 1 refills | Status: DC
Start: 2022-11-16 — End: 2022-12-29

## 2022-11-17 ENCOUNTER — Encounter: Payer: Self-pay | Admitting: Hematology and Oncology

## 2022-12-03 DIAGNOSIS — K118 Other diseases of salivary glands: Secondary | ICD-10-CM | POA: Diagnosis not present

## 2022-12-15 ENCOUNTER — Other Ambulatory Visit (HOSPITAL_COMMUNITY): Payer: Self-pay | Admitting: Otolaryngology

## 2022-12-15 DIAGNOSIS — K118 Other diseases of salivary glands: Secondary | ICD-10-CM

## 2022-12-16 ENCOUNTER — Encounter: Payer: Self-pay | Admitting: General Practice

## 2022-12-16 NOTE — Progress Notes (Signed)
Oley Balm, MD  Caroleen Hamman, NT PROCEDURE / BIOPSY REVIEW Date: 12/16/22  Requested Biopsy site: R parotid lesion Reason for request: 11 mm ovoid predominantly T2 hyperintense focus within the superficial lobe of the right parotid gland (for instance as seen on series 12, image 3). Imaging review: Best seen on MR 11/02/22  Decision: Approved Imaging modality to perform: Ultrasound Schedule with: Patient preference (Local vs Mod Sed) Schedule for: Any VIR  Additional comments:   Please contact me with questions, concerns, or if issue pertaining to this request arise.  Dayne Oley Balm, MD [facility]       Previous Messages    ----- Message ----- From: Caroleen Hamman, NT Sent: 12/16/2022  11:14 AM EDT To: Ir Procedure Requests Subject: [place] FNA SALIVARY GLAND/PAROTID GLAND            Procedure: [place] FNA SALIVARY GLAND/PAROTID GLAND  Reason: Parotid Mass  History: CT in chart  Provider: Serena Colonel, MD  Contact: [phone]

## 2022-12-17 ENCOUNTER — Inpatient Hospital Stay: Payer: BC Managed Care – PPO | Attending: Gynecologic Oncology

## 2022-12-17 ENCOUNTER — Other Ambulatory Visit: Payer: Self-pay

## 2022-12-17 DIAGNOSIS — Z79899 Other long term (current) drug therapy: Secondary | ICD-10-CM | POA: Diagnosis not present

## 2022-12-17 DIAGNOSIS — M549 Dorsalgia, unspecified: Secondary | ICD-10-CM | POA: Diagnosis not present

## 2022-12-17 DIAGNOSIS — Z8543 Personal history of malignant neoplasm of ovary: Secondary | ICD-10-CM | POA: Insufficient documentation

## 2022-12-17 DIAGNOSIS — N200 Calculus of kidney: Secondary | ICD-10-CM | POA: Diagnosis not present

## 2022-12-17 DIAGNOSIS — Z791 Long term (current) use of non-steroidal anti-inflammatories (NSAID): Secondary | ICD-10-CM | POA: Insufficient documentation

## 2022-12-17 DIAGNOSIS — K760 Fatty (change of) liver, not elsewhere classified: Secondary | ICD-10-CM | POA: Insufficient documentation

## 2022-12-17 DIAGNOSIS — E669 Obesity, unspecified: Secondary | ICD-10-CM | POA: Diagnosis not present

## 2022-12-17 DIAGNOSIS — I700 Atherosclerosis of aorta: Secondary | ICD-10-CM | POA: Diagnosis not present

## 2022-12-17 DIAGNOSIS — Z9221 Personal history of antineoplastic chemotherapy: Secondary | ICD-10-CM | POA: Diagnosis not present

## 2022-12-17 DIAGNOSIS — R918 Other nonspecific abnormal finding of lung field: Secondary | ICD-10-CM | POA: Insufficient documentation

## 2022-12-17 DIAGNOSIS — K8020 Calculus of gallbladder without cholecystitis without obstruction: Secondary | ICD-10-CM | POA: Diagnosis not present

## 2022-12-17 DIAGNOSIS — Z79890 Hormone replacement therapy (postmenopausal): Secondary | ICD-10-CM | POA: Diagnosis not present

## 2022-12-17 DIAGNOSIS — R599 Enlarged lymph nodes, unspecified: Secondary | ICD-10-CM | POA: Diagnosis not present

## 2022-12-17 DIAGNOSIS — C561 Malignant neoplasm of right ovary: Secondary | ICD-10-CM

## 2022-12-17 DIAGNOSIS — K824 Cholesterolosis of gallbladder: Secondary | ICD-10-CM | POA: Diagnosis not present

## 2022-12-17 LAB — COMPREHENSIVE METABOLIC PANEL
ALT: 47 U/L — ABNORMAL HIGH (ref 0–44)
AST: 25 U/L (ref 15–41)
Albumin: 4.5 g/dL (ref 3.5–5.0)
Alkaline Phosphatase: 75 U/L (ref 38–126)
Anion gap: 8 (ref 5–15)
BUN: 17 mg/dL (ref 6–20)
CO2: 27 mmol/L (ref 22–32)
Calcium: 9.4 mg/dL (ref 8.9–10.3)
Chloride: 107 mmol/L (ref 98–111)
Creatinine, Ser: 0.79 mg/dL (ref 0.44–1.00)
GFR, Estimated: >60 mL/min (ref >60)
Glucose, Bld: 112 mg/dL — ABNORMAL HIGH (ref 70–99)
Potassium: 4.2 mmol/L (ref 3.5–5.1)
Sodium: 142 mmol/L (ref 135–145)
Total Bilirubin: 0.3 mg/dL (ref 0.3–1.2)
Total Protein: 7.3 g/dL (ref 6.5–8.1)

## 2022-12-17 LAB — CBC WITH DIFFERENTIAL/PLATELET
Abs Immature Granulocytes: 0.02 10*3/uL (ref 0.00–0.07)
Basophils Absolute: 0 10*3/uL (ref 0.0–0.1)
Basophils Relative: 1 %
Eosinophils Absolute: 0.2 10*3/uL (ref 0.0–0.5)
Eosinophils Relative: 4 %
HCT: 40.6 % (ref 36.0–46.0)
Hemoglobin: 13.5 g/dL (ref 12.0–15.0)
Immature Granulocytes: 0 %
Lymphocytes Relative: 31 %
Lymphs Abs: 1.4 10*3/uL (ref 0.7–4.0)
MCH: 30.1 pg (ref 26.0–34.0)
MCHC: 33.3 g/dL (ref 30.0–36.0)
MCV: 90.6 fL (ref 80.0–100.0)
Monocytes Absolute: 0.3 10*3/uL (ref 0.1–1.0)
Monocytes Relative: 7 %
Neutro Abs: 2.6 10*3/uL (ref 1.7–7.7)
Neutrophils Relative %: 57 %
Platelets: 294 10*3/uL (ref 150–400)
RBC: 4.48 MIL/uL (ref 3.87–5.11)
RDW: 13.2 % (ref 11.5–15.5)
WBC: 4.5 10*3/uL (ref 4.0–10.5)
nRBC: 0 % (ref 0.0–0.2)

## 2022-12-22 ENCOUNTER — Inpatient Hospital Stay: Payer: BC Managed Care – PPO | Admitting: Hematology and Oncology

## 2022-12-22 ENCOUNTER — Encounter: Payer: Self-pay | Admitting: Hematology and Oncology

## 2022-12-22 ENCOUNTER — Other Ambulatory Visit: Payer: Self-pay

## 2022-12-22 VITALS — BP 128/59 | HR 69 | Temp 98.1°F | Resp 18 | Ht 62.0 in | Wt 181.0 lb

## 2022-12-22 DIAGNOSIS — M546 Pain in thoracic spine: Secondary | ICD-10-CM

## 2022-12-22 DIAGNOSIS — Z8543 Personal history of malignant neoplasm of ovary: Secondary | ICD-10-CM | POA: Diagnosis not present

## 2022-12-22 DIAGNOSIS — Z9221 Personal history of antineoplastic chemotherapy: Secondary | ICD-10-CM | POA: Diagnosis not present

## 2022-12-22 DIAGNOSIS — E669 Obesity, unspecified: Secondary | ICD-10-CM

## 2022-12-22 DIAGNOSIS — K824 Cholesterolosis of gallbladder: Secondary | ICD-10-CM | POA: Diagnosis not present

## 2022-12-22 DIAGNOSIS — N200 Calculus of kidney: Secondary | ICD-10-CM | POA: Diagnosis not present

## 2022-12-22 DIAGNOSIS — K760 Fatty (change of) liver, not elsewhere classified: Secondary | ICD-10-CM | POA: Diagnosis not present

## 2022-12-22 DIAGNOSIS — R599 Enlarged lymph nodes, unspecified: Secondary | ICD-10-CM | POA: Diagnosis not present

## 2022-12-22 DIAGNOSIS — Z791 Long term (current) use of non-steroidal anti-inflammatories (NSAID): Secondary | ICD-10-CM | POA: Diagnosis not present

## 2022-12-22 DIAGNOSIS — Z79890 Hormone replacement therapy (postmenopausal): Secondary | ICD-10-CM | POA: Diagnosis not present

## 2022-12-22 DIAGNOSIS — K8020 Calculus of gallbladder without cholecystitis without obstruction: Secondary | ICD-10-CM | POA: Diagnosis not present

## 2022-12-22 DIAGNOSIS — C561 Malignant neoplasm of right ovary: Secondary | ICD-10-CM

## 2022-12-22 DIAGNOSIS — Z79899 Other long term (current) drug therapy: Secondary | ICD-10-CM | POA: Diagnosis not present

## 2022-12-22 DIAGNOSIS — I700 Atherosclerosis of aorta: Secondary | ICD-10-CM | POA: Diagnosis not present

## 2022-12-22 DIAGNOSIS — M549 Dorsalgia, unspecified: Secondary | ICD-10-CM | POA: Diagnosis not present

## 2022-12-22 DIAGNOSIS — R918 Other nonspecific abnormal finding of lung field: Secondary | ICD-10-CM | POA: Diagnosis not present

## 2022-12-22 NOTE — Progress Notes (Signed)
[facility] OFFICE PROGRESS NOTE  Patient Care Team: [name], MD as PCP - General (Family Medicine)  ASSESSMENT & PLAN:  Ovarian cancer, right [facility]) I have reviewed multiple imaging studies with the patient  We reviewed results of her tumor marker She has no evidence of disease recurrence I plan to see her again in 6 months we have repeat tumor marker She will see GYN surgeon in 3 months for further follow-up  Acute back pain She has history of intermittent back pain I reviewed imaging study with the patient and could not identify the source of her back pain She has appointment to see her primary care doctor for evaluation  Obesity, Class I, BMI 30-34.9 We have some discussion about dietary modification and importance of weight loss She is meeting with her primary care doctor to discuss this as well   No orders of the defined types were placed in this encounter.   All questions were answered. The patient knows to call the clinic with any problems, questions or concerns. The total time spent in the appointment was 20 minutes encounter with patients including review of chart and various tests results, discussions about plan of care and coordination of care plan   [name], MD 12/22/2022 12:02 PM  INTERVAL HISTORY: Please see below for problem oriented charting. she returns for surveillance follow-up She is healing well after recent appendectomy She continues to have intermittent lumbar back pain especially on the right side She have no signs or symptoms to suggest infection We discussed blood test results and reviewed recent CT imaging studies  REVIEW OF SYSTEMS:   Constitutional: Denies fevers, chills or abnormal weight loss Eyes: Denies blurriness of vision Ears, nose, mouth, throat, and face: Denies mucositis or sore throat Respiratory: Denies cough, dyspnea or wheezes Cardiovascular: Denies palpitation, chest discomfort or lower extremity  swelling Gastrointestinal:  Denies nausea, heartburn or change in bowel habits Skin: Denies abnormal skin rashes Lymphatics: Denies new lymphadenopathy or easy bruising Neurological:Denies numbness, tingling or new weaknesses Behavioral/Psych: Mood is stable, no new changes  All other systems were reviewed with the patient and are negative.  I have reviewed the past medical history, past surgical history, social history and family history with the patient and they are unchanged from previous note.  ALLERGIES:  is allergic to codeine.  MEDICATIONS:  Current Outpatient Medications  Medication Sig Dispense Refill   valsartan (DIOVAN) 160 MG tablet Take 1 tablet (160 mg total) by mouth daily. 90 tablet 3   acetaminophen (TYLENOL) 500 MG tablet Take 2 tablets (1,000 mg total) by mouth 4 (four) times daily. 120 tablet 3   amLODipine (NORVASC) 5 MG tablet Take 1 tablet (5 mg total) by mouth daily. 90 tablet 1   calcium carbonate (TUMS - DOSED IN MG ELEMENTAL CALCIUM) 500 MG chewable tablet Chew 1 tablet by mouth daily.     Cholecalciferol (VITAMIN D3) 1.25 MG (50000 UT) CAPS Vitamin D3  5,000 iu qd     docusate sodium (COLACE) 100 MG capsule Take 1 capsule (100 mg total) by mouth 2 (two) times daily. 60 capsule 2   fluorometholone (FML) 0.1 % ophthalmic suspension 1 drop 4 (four) times daily.     ibuprofen (ADVIL) 600 MG tablet Take 1 tablet (600 mg total) by mouth 4 (four) times daily. 120 tablet 1   levothyroxine (SYNTHROID) 50 MCG tablet TAKE 1 TABLET BY MOUTH EVERY DAY 90 tablet 3   linaclotide (LINZESS) 145 MCG CAPS capsule Take  1 capsule (145 mcg total) by mouth daily before breakfast. (Patient taking differently: Take 145 mcg by mouth as needed.) 90 capsule 1   LORazepam (ATIVAN) 0.5 MG tablet Take 1 tablet (0.5 mg total) by mouth 2 (two) times daily as needed for anxiety (30 minutes before imaging.  Do not drive for 8 hours after use). 2 tablet 0   methocarbamol (ROBAXIN-750) 750 MG  tablet Take 1 tablet (750 mg total) by mouth 4 (four) times daily. 120 tablet 1   oxyCODONE (ROXICODONE) 5 MG immediate release tablet Take 1 tablet (5 mg total) by mouth every 4 (four) hours as needed. 15 tablet 0   rosuvastatin (CRESTOR) 5 MG tablet Take 1 tablet (5 mg total) by mouth daily. 90 tablet 3   vitamin B-12 (CYANOCOBALAMIN) 1000 MCG tablet Take 1,000 mcg by mouth in the morning.     vitamin C (ASCORBIC ACID) 500 MG tablet Take 500 mg by mouth in the morning.     No current facility-administered medications for this visit.    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 Results of the tumor marker test revealed 62.7.   11/20/2020 Imaging   CT scan of abdomen and pelvis  1. There is a large, cystic mass of the right ovary with peripheral nodular soft tissue components measuring at least 19.8 x 13.3 x 19.8 cm. Findings are consistent with primary ovarian malignancy. 2. Minimal stranding of the omentum in the ventral abdomen and minimal thickening of the peritoneum in the right lower quadrant in the right paracolic gutter without evidence of discrete omental or peritoneal nodularity. Findings are modestly suspicious for early peritoneal metastatic disease. 3. No lymphadenopathy or other evidence of metastatic disease in the abdomen or pelvis. 4. Polyp or adherent gallstone near the gallbladder fundus, better assessed by prior ultrasound. 5. Large burden of stool throughout the colon and rectum.   Aortic Atherosclerosis (ICD10-I70.0).   12/03/2020 Pathology Results   SURGICAL PATHOLOGY  CASE: [id number]  PATIENT: Brooke Khan  Surgical Pathology Report   Clinical History: Ovarian mass (crm)    FINAL MICROSCOPIC DIAGNOSIS:    A. UTERUS, CERVIX, BILATERAL FALLOPIAN TUBES AND OVARIES:  Ovary, right  -  Endometrioid carcinoma, FIGO grade 3  -  See oncology table and comment below   Ovary, left:  -  No carcinoma identified   Bilateral Fallopian tubes:  -  No carcinoma identified   Uterus:  -  No carcinoma identified   Cervix:  -  No carcinoma identified   B. LYMPH NODES, RIGHT PARAAORTIC, RESECTION:  -  No carcinoma identified in six lymph nodes (0/6)   C. LYMPH NODES, LEFT PARAAORTIC, RESECTION:  -  No carcinoma identified in five lymph nodes (0/5)   D. LYMPH NODES, RIGHT PELVIC, RESECTION:  -  No carcinoma identified in five lymph nodes (0/5)   E. LYMPH NODES, LEFT PELVIC, RESECTION:  -  No carcinoma identified in three lymph nodes (0/3)   F. OMENTUM, OMENTECTOMY:  -  No carcinoma identified   G. PERITONEUM, ANTERIOR PELVIC, BIOPSY:  -  No carcinoma identified   H. PERITONEUM, RIGHT PELVIC, BIOPSY:  -  No carcinoma identified   I. PERITONEUM, LEFT PELVIC, BIOPSY:  -  No carcinoma identified  J. PERITONEUM, POSTERIOR PELVIC, BIOPSY:  -  No carcinoma identified   K. PERITONEUM, RIGHT ABDOMINAL, BIOPSY:  -  No carcinoma identified   L. PERITONEUM, LEFT ABDOMINAL, BIOPSY:  -  No carcinoma identified    ONCOLOGY TABLE:   OVARY or FALLOPIAN TUBE or PRIMARY PERITONEUM: Resection   Procedure: Hysterectomy with bilateral salpingo-oophorectomy  Specimen Integrity: Intact  Tumor Site: Ovary, right  Tumor Size: 1.5 cm  Histologic Type: Endometrioid carcinoma  Histologic Grade: FIGO grade 3  Ovarian Surface Involvement: Not identified  Fallopian Tube Surface Involvement: Not identified  Other Tissue/ Organ Involvement: Not applicable  Largest Extrapelvic Peritoneal Focus: Not applicable  Peritoneal/Ascitic Fluid Involvement: Malignant cells not identified  Chemotherapy Response Score (CRS): Not applicable, no known presurgical therapy  Regional Lymph Nodes:       Number of Nodes  with Metastasis Greater than 10 mm: 0       Number of Nodes with Metastasis 10 mm or Less (excludes isolated  tumor cells): 0       Number of Nodes with Isolated Tumor Cells (0.2 mm or less): 0       Number of Lymph Nodes Examined: 19  Distant Metastasis:       Distant Site(s) Involved: Not applicable  Pathologic Stage Classification (pTNM, AJCC 8th Edition): pT1a, pN0  Ancillary Studies: Can be performed upon request  Representative Tumor Block: A8  Comment(s): Dr. Kenard Gower reviewed the case and agrees with the above diagnosis.     12/03/2020 Surgery   Surgeon: Quinn Axe    Operation: Robotic-assisted laparoscopic total hysterectomy with bilateral salpingoophorectomy, omentectomy, pelvic and para-aortic lymphadnectomy.     Surgeon: Quinn Axe    Operative Findings:  : 20cm cystic mass, smooth. Attachments between inferior aspect of cystic mass and posterior pelvic peritoneum. Normal appearing omentum and nodes. Invasive malignancy on evaluation. No gross extra-ovarian disease. Clinical stage I disease. No gross residual disease.    12/11/2020 Cancer Staging   Staging form: Ovary, Fallopian Tube, and Primary Peritoneal Carcinoma, AJCC 8th Edition - Pathologic stage from 12/11/2020: FIGO Stage IA (pT1a, pN0, cM0) - Signed by [name], MD on 12/11/2020 Stage prefix: Initial diagnosis   01/10/2021 Genetic Testing   Negative germline and somatic genetic testing: no pathogenic variants detected in [employer] MyRisk Panel.   No somatic variants detected in [employer] [facility] HRD testing.  Genomic instaiblity score is negative.  The report dates are January 10, 2021.    The [facility] gene panel offered by [employer] includes sequencing and deletion/duplication testing of the following 48 genes: APC, ATM, AXIN2, BAP1, BARD1, BMPR1A, BRCA1, BRCA2, BRIP1, CHD1, CDK4, CDKN2A(p16 and p14ARF), CHEK2, CTNNA1, EGFR, EPCAM, FH, FLCN, GREM1, HOXB13, MEN1, MET, MITF , MLH1,  MSH2, MSH3, MSH6, MUTYH, NTHL1, PALB2, PMS2, POLD1, POLE, PTEN, RAD51C, RAD51D, RET, SDHA, SDHB, SDHC, SDHD, SMAD4, STK11,TERT, TP53, TSC1, TSC2, and VHL.    01/15/2021 - 03/17/2021 Chemotherapy   Patient is on Treatment Plan : OVARIAN Carboplatin (AUC 6) / Paclitaxel (175) q21d x 6 cycles     04/15/2021 Tumor Marker   Patient's tumor was tested for the following markers: Ca-125. Results of the tumor marker test revealed 7.7.   04/16/2021 Imaging   1. New left periaortic adenopathy and new left omental and mesenteric root nodules, indicative of metastatic disease. 2. Right lower lobe nodules are stable. 3. Hepatic steatosis. 4. Cholelithiasis. 5. Left renal stone. 6.  Aortic atherosclerosis (ICD10-I70.0).   06/17/2021 Imaging   1. Small RIGHT retroperitoneal  nodule not significant changed. Recommend attention on follow-up. 2. Two low-density para-aortic lymph nodes unchanged from comparison exam. 3. No clear evidence omental or peritoneal metastasis. 4. Stable small pulmonary nodule in the RIGHT lower lobe.     06/19/2021 Tumor Marker   Patient's tumor was tested for the following markers: CA-125. Results of the tumor marker test revealed 6.3.   12/17/2021 Imaging   Stable small low-attenuation retroperitoneal lymph nodes in left para-aortic region. No new or progressive disease within the abdomen or pelvis.   Stable 6 mm polypoid density along the nondependent gallbladder wall consistent with gallbladder polyp. Recommend continued imaging follow-up with ultrasound in 6-12 months.   Stable tiny nonobstructing left renal calculus.   Mild increase in size of midline paraumbilical hernia, which contains only fat.   Aortic Atherosclerosis (ICD10-I70.0).   12/17/2021 Tumor Marker   Patient's tumor was tested for the following markers: CA-125. Results of the tumor marker test revealed 6.8.   03/19/2022 Tumor Marker   Patient's tumor was tested for the following markers: CA-125. Results of  the tumor marker test revealed 6.5.   06/22/2022 Tumor Marker   Patient's tumor was tested for the following markers: CA-125. Results of the tumor marker test revealed 6.3.   09/18/2022 Tumor Marker   Patient's tumor was tested for the following markers: CA-125. Results of the tumor marker test revealed 6.3.   09/18/2022 Imaging   CT ABDOMEN PELVIS W CONTRAST  Result Date: 09/18/2022 CLINICAL DATA:  Ovarian carcinoma. Chemotherapy completed November 2022. * Tracking Code: BO * EXAM: CT ABDOMEN AND PELVIS WITH CONTRAST TECHNIQUE: Multidetector CT imaging of the abdomen and pelvis was performed using the standard protocol following bolus administration of intravenous contrast. RADIATION DOSE REDUCTION: This exam was performed according to the departmental dose-optimization program which includes automated exposure control, adjustment of the mA and/or kV according to patient size and/or use of iterative reconstruction technique. CONTRAST:  OMNIPAQUE IOHEXOL 300 MG/ML  SOLN COMPARISON:  None Available. FINDINGS: Lower chest: Lung bases are clear. Hepatobiliary: No focal hepatic lesion. 6 mm polypoid lesion within the gallbladder not changed from prior (image 27/series 9). No biliary duct dilatation. Common bile duct is normal. Pancreas: Pancreas is normal. No ductal dilatation. No pancreatic inflammation. Spleen: Normal spleen Adrenals/urinary tract: Adrenal glands and kidneys are normal. The ureters and bladder normal. Stomach/Bowel: Stomach, small bowel, appendix, and cecum are normal. The colon and rectosigmoid colon are normal. Vascular/Lymphatic: Abdominal aorta is normal caliber. No periportal or retroperitoneal adenopathy. No pelvic adenopathy. Reproductive: Post hysterectomy. Post oophorectomy. No pelvic sidewall nodularity. No adnexal nodularity. No peritoneal nodularity. No fluid within the abdomen pelvis. Other: Low-attenuation lesion in the retroperitoneum LEFT of the aortic bifurcation is  decreased in size measuring 4 mm (image 50/series 9) compared to 13 mm on prior. Musculoskeletal: No aggressive osseous lesion. IMPRESSION: 1. No evidence of ovarian cancer recurrence or metastasis. 2. Interval decrease in size of low-attenuation lesion in the retroperitoneum LEFT of the aortic bifurcation. 3. Stable polypoid lesion in the gallbladder. Electronically Signed   By: Genevive Bi M.D.   On: 09/18/2022 08:24      10/25/2022 Imaging   MR Brain Wo Contrast  Result Date: 11/02/2022 CLINICAL DATA:  Provided history: Left temporal headache. New onset of headaches after age 67. Headache, new onset. EXAM: MRI HEAD WITHOUT CONTRAST TECHNIQUE: Multiplanar, multiecho pulse sequences of the brain and surrounding structures were obtained without intravenous contrast. COMPARISON:  No pertinent prior exams available for comparison. FINDINGS: Brain:  Cerebral volume is normal. No cortical encephalomalacia is identified. No significant cerebral white matter disease. There is no acute infarct. No evidence of an intracranial mass. No chronic intracranial blood products. No extra-axial fluid collection. No midline shift. Vascular: Maintained flow voids within the proximal large arterial vessels. Skull and upper cervical spine: No focal suspicious marrow lesion. Sinuses/Orbits: No mass or acute finding within the imaged orbits. Minimal mucosal thickening versus small mucous retention cyst within the right maxillary sinus. Other: 11 mm ovoid predominantly T2 hyperintense focus within the superficial lobe of the right parotid gland (for instance as seen on series 12, image 3). Impression #2 will be called to the ordering clinician or representative by the Radiologist Assistant, and communication documented in the PACS or [employer]. IMPRESSION: 1. Unremarkable non-contrast MRI appearance of the brain. No evidence of an acute intracranial abnormality. 2. 11 mm ovoid predominantly T2 hyperintense focus within the  superficial lobe of the right parotid gland. This may reflect a primary parotid neoplasm, cyst or enlarged/abnormal intraparotid lymph node. ENT referral recommended. 3. Minimal mucosal thickening or small mucous retention cyst within the right maxillary sinus. Electronically Signed   By: Jackey Loge D.O.   On: 11/02/2022 17:34   CT CHEST ABDOMEN PELVIS W CONTRAST  Result Date: 10/25/2022 CLINICAL DATA:  Severe chest, abdominal and back pain beginning last night. Metastatic ovarian carcinoma. * Tracking Code: BO * EXAM: CT CHEST, ABDOMEN, AND PELVIS WITH CONTRAST TECHNIQUE: Multidetector CT imaging of the chest, abdomen and pelvis was performed following the standard protocol during bolus administration of intravenous contrast. RADIATION DOSE REDUCTION: This exam was performed according to the departmental dose-optimization program which includes automated exposure control, adjustment of the mA and/or kV according to patient size and/or use of iterative reconstruction technique. CONTRAST:  75mL OMNIPAQUE IOHEXOL 350 MG/ML SOLN COMPARISON:  AP only CT on 09/16/2022 FINDINGS: CT CHEST FINDINGS Cardiovascular: No acute findings. Mediastinum/Lymph Nodes: No masses or pathologically enlarged lymph nodes identified. Lungs/Pleura: 2 tiny 3 mm pulmonary nodules are seen in the posterior right lower lobe on images 68 and 71 of series 5, which are nonspecific. No evidence of infiltrate or pleural effusion. Musculoskeletal:  No suspicious bone lesions identified. CT ABDOMEN AND PELVIS FINDINGS Hepatobiliary: No masses identified. Stable 7 mm polypoid lesion in the gallbladder. No evidence of cholecystitis or biliary ductal dilatation. Pancreas:  No mass or inflammatory changes. Spleen:  Within normal limits in size and appearance. Adrenals/Urinary tract: No suspicious masses or hydronephrosis. Stomach/Bowel: The appendix is enlarged, measuring 14 mm, which is new findings since previous study. Mild periappendiceal  inflammatory changes are seen and several small appendicoliths are also noted. No evidence of perforation or abscess. No evidence of bowel obstruction. Vascular/Lymphatic: No pathologically enlarged lymph nodes identified. No acute vascular findings. Aortic atherosclerotic calcification incidentally noted. Reproductive: Prior hysterectomy noted. Adnexal regions are unremarkable in appearance. Other:  None. Musculoskeletal:  No suspicious bone lesions identified. IMPRESSION: Positive for acute appendicitis. No evidence of perforation or abscess. No definite evidence of recurrent or metastatic carcinoma. Two 3 mm indeterminate pulmonary nodules in right lower lobe. Recommend continued follow-up by chest CT in 3 months. Stable 7 mm gallbladder polyp. Recommend continued attention on follow-up imaging. Aortic Atherosclerosis (ICD10-I70.0). Electronically Signed   By: Danae Orleans M.D.   On: 10/25/2022 11:27      12/21/2022 Tumor Marker   Patient's tumor was tested for the following markers: CA-125. Results of the tumor marker test revealed 6.3.     PHYSICAL  EXAMINATION: ECOG PERFORMANCE STATUS: 1 - Symptomatic but completely ambulatory  Vitals:   12/22/22 1034  BP: (!) 128/59  Pulse: 69  Resp: 18  Temp: 98.1 F (36.7 C)  SpO2: 97%   Filed Weights   12/22/22 1034  Weight: 181 lb (82.1 kg)    GENERAL:alert, no distress and comfortable NEURO: alert & oriented x 3 with fluent speech, no focal motor/sensory deficits  LABORATORY DATA:  I have reviewed the data as listed    Component Value Date/Time   NA 142 12/17/2022 1007   K 4.2 12/17/2022 1007   CL 107 12/17/2022 1007   CO2 27 12/17/2022 1007   GLUCOSE 112 (H) 12/17/2022 1007   BUN 17 12/17/2022 1007   CREATININE 0.79 12/17/2022 1007   CREATININE 0.97 03/18/2022 1523   CALCIUM 9.4 12/17/2022 1007   PROT 7.3 12/17/2022 1007   ALBUMIN 4.5 12/17/2022 1007   AST 25 12/17/2022 1007   AST 20 03/18/2022 1523   ALT 47 (H) 12/17/2022  1007   ALT 26 03/18/2022 1523   ALKPHOS 75 12/17/2022 1007   BILITOT 0.3 12/17/2022 1007   BILITOT 0.6 03/18/2022 1523   GFRNONAA >60 12/17/2022 1007   GFRNONAA >60 03/18/2022 1523    No results found for: "SPEP", "UPEP"  Lab Results  Component Value Date   WBC 4.5 12/17/2022   NEUTROABS 2.6 12/17/2022   HGB 13.5 12/17/2022   HCT 40.6 12/17/2022   MCV 90.6 12/17/2022   PLT 294 12/17/2022      Chemistry      Component Value Date/Time   NA 142 12/17/2022 1007   K 4.2 12/17/2022 1007   CL 107 12/17/2022 1007   CO2 27 12/17/2022 1007   BUN 17 12/17/2022 1007   CREATININE 0.79 12/17/2022 1007   CREATININE 0.97 03/18/2022 1523      Component Value Date/Time   CALCIUM 9.4 12/17/2022 1007   ALKPHOS 75 12/17/2022 1007   AST 25 12/17/2022 1007   AST 20 03/18/2022 1523   ALT 47 (H) 12/17/2022 1007   ALT 26 03/18/2022 1523   BILITOT 0.3 12/17/2022 1007   BILITOT 0.6 03/18/2022 1523       RADIOGRAPHIC STUDIES: I have reviewed multiple imaging studies with the patient I have personally reviewed the radiological images as listed and agreed with the findings in the report.

## 2022-12-22 NOTE — Assessment & Plan Note (Signed)
We have some discussion about dietary modification and importance of weight loss She is meeting with her primary care doctor to discuss this as well

## 2022-12-22 NOTE — Assessment & Plan Note (Signed)
I have reviewed multiple imaging studies with the patient  We reviewed results of her tumor marker She has no evidence of disease recurrence I plan to see her again in 6 months we have repeat tumor marker She will see GYN surgeon in 3 months for further follow-up

## 2022-12-22 NOTE — Assessment & Plan Note (Signed)
She has history of intermittent back pain I reviewed imaging study with the patient and could not identify the source of her back pain She has appointment to see her primary care doctor for evaluation

## 2022-12-29 ENCOUNTER — Encounter: Payer: Self-pay | Admitting: Family Medicine

## 2022-12-29 ENCOUNTER — Ambulatory Visit: Payer: BC Managed Care – PPO | Admitting: Family Medicine

## 2022-12-29 VITALS — BP 124/60 | HR 69 | Temp 98.2°F | Ht 62.0 in | Wt 181.8 lb

## 2022-12-29 DIAGNOSIS — Z9221 Personal history of antineoplastic chemotherapy: Secondary | ICD-10-CM

## 2022-12-29 DIAGNOSIS — M25551 Pain in right hip: Secondary | ICD-10-CM

## 2022-12-29 DIAGNOSIS — R7303 Prediabetes: Secondary | ICD-10-CM | POA: Diagnosis not present

## 2022-12-29 DIAGNOSIS — R5383 Other fatigue: Secondary | ICD-10-CM

## 2022-12-29 DIAGNOSIS — Z8543 Personal history of malignant neoplasm of ovary: Secondary | ICD-10-CM

## 2022-12-29 DIAGNOSIS — E039 Hypothyroidism, unspecified: Secondary | ICD-10-CM

## 2022-12-29 DIAGNOSIS — Z131 Encounter for screening for diabetes mellitus: Secondary | ICD-10-CM

## 2022-12-29 DIAGNOSIS — Z79899 Other long term (current) drug therapy: Secondary | ICD-10-CM

## 2022-12-29 DIAGNOSIS — M546 Pain in thoracic spine: Secondary | ICD-10-CM | POA: Diagnosis not present

## 2022-12-29 DIAGNOSIS — E785 Hyperlipidemia, unspecified: Secondary | ICD-10-CM

## 2022-12-29 DIAGNOSIS — I700 Atherosclerosis of aorta: Secondary | ICD-10-CM

## 2022-12-29 DIAGNOSIS — G8929 Other chronic pain: Secondary | ICD-10-CM

## 2022-12-29 MED ORDER — ROSUVASTATIN CALCIUM 5 MG PO TABS: 5.0000 mg | ORAL_TABLET | Freq: Every day | ORAL | 3 refills | Status: DC

## 2022-12-29 MED ORDER — AMLODIPINE BESYLATE 10 MG PO TABS: 10.0000 mg | ORAL_TABLET | Freq: Every day | ORAL | 3 refills | Status: DC

## 2022-12-29 MED ORDER — VALSARTAN 160 MG PO TABS: 160.0000 mg | ORAL_TABLET | Freq: Every day | ORAL | 3 refills | Status: DC

## 2022-12-29 NOTE — Progress Notes (Signed)
Phone [phone] In person visit   Subjective:   Brooke Khan is a 57 y.o. year old very pleasant female patient who presents for/with See problem oriented charting Chief Complaint  Patient presents with   Back Pain    Pt c/o back pain x3 months that was constant up until 3 weeks ago   right hip    Pt c/o limping in right hip since having appendicitis surgery.   parotid gland biopsy    Wants to know if ok to take cholesterol meds   pap/mammo    Records requested.   Past Medical History-  Patient Active Problem List   Diagnosis Date Noted   Aortic atherosclerosis (HCC) 12/29/2022   Obesity, Class I, BMI 30-34.9 06/23/2022   Gallbladder mass 06/23/2022   Acute back pain 06/19/2021   Mild anxiety 04/07/2021   Genetic testing 01/21/2021   Family history of breast cancer 12/26/2020   Family history of prostate cancer 12/26/2020   Other constipation 12/15/2020   Elevated CA-125 12/03/2020   Ovarian cancer, right (HCC)    Elevated blood pressure reading 02/01/2019   Hypothyroidism, unspecified 10/04/2018   Vitamin D deficiency 10/04/2018    Medications- reviewed and updated Current Outpatient Medications  Medication Sig Dispense Refill   acetaminophen (TYLENOL) 500 MG tablet Take 2 tablets (1,000 mg total) by mouth 4 (four) times daily. 120 tablet 3   calcium carbonate (TUMS - DOSED IN MG ELEMENTAL CALCIUM) 500 MG chewable tablet Chew 1 tablet by mouth daily.     Cholecalciferol (VITAMIN D3) 1.25 MG (50000 UT) CAPS Vitamin D3  5,000 iu qd     fluorometholone (FML) 0.1 % ophthalmic suspension 1 drop 4 (four) times daily.     ibuprofen (ADVIL) 600 MG tablet Take 1 tablet (600 mg total) by mouth 4 (four) times daily. 120 tablet 1   levothyroxine (SYNTHROID) 50 MCG tablet TAKE 1 TABLET BY MOUTH EVERY DAY 90 tablet 3   linaclotide (LINZESS) 145 MCG CAPS capsule Take 1 capsule (145 mcg total) by mouth daily before breakfast. (Patient taking differently: Take 145 mcg by  mouth as needed.) 90 capsule 1   vitamin B-12 (CYANOCOBALAMIN) 1000 MCG tablet Take 1,000 mcg by mouth in the morning.     vitamin C (ASCORBIC ACID) 500 MG tablet Take 500 mg by mouth in the morning.     amLODipine (NORVASC) 10 MG tablet Take 1 tablet (10 mg total) by mouth daily. 90 tablet 3   LORazepam (ATIVAN) 0.5 MG tablet Take 1 tablet (0.5 mg total) by mouth 2 (two) times daily as needed for anxiety (30 minutes before imaging.  Do not drive for 8 hours after use). (Patient not taking: Reported on 12/29/2022) 2 tablet 0   rosuvastatin (CRESTOR) 5 MG tablet Take 1 tablet (5 mg total) by mouth daily. 90 tablet 3   valsartan (DIOVAN) 160 MG tablet Take 1 tablet (160 mg total) by mouth daily. 90 tablet 3   No current facility-administered medications for this visit.     Objective:  BP 124/60   Pulse 69   Temp 98.2 F (36.8 C)   Ht 5\' 2"  (1.575 m)   Wt 181 lb 12.8 oz (82.5 kg)   LMP 05/11/2014   SpO2 95%   BMI 33.25 kg/m  Gen: NAD, resting comfortably CV: RRR no murmurs rubs or gallops Lungs: CTAB no crackles, wheeze, rhonchi Ext: trace edema Skin: warm, dry Back - Normal skin, Spine with normal alignment and no deformity.  No tenderness  to vertebral process palpation.  Paraspinous muscles are not tender (even over area of right thoracic back that patient reports pain) and without spasm.   Range of motion is full at neck and lumbar sacral regions. Negative Straight leg raise. Negative stinchfield test Neuro- no saddle anesthesia, 5/5 strength lower extremities, 2+ reflexes     Assessment and Plan   # Right thoracic back and right hip pain S: Patient with back pain for last 3 months that was rather constant up until about 3 weeks ago (started a supplement called asea revitalizing redox gel (pain was 5-6/10 at its worst and now with gel down to 3/10) - started to right of thoracic spine with radiation at times to left but also at other times radiation to right low back.  -she was  having this pain when had CT chest abdomen pelvis on June 16th 2024 and no suspicious bony abnormalities noted. Heating pad helps -She also has noted limping-discomfort in her right hip since appendicitis surgery in july- bothers her 5-10 minutes into walk- hurts from low back into groin and trouble walking and after 5-10 minutes goes away again. Pain is severe through and can stop her in her tracks A/P: Patient with  right thoracic back pain (radiation to right low back and left thoracic spine) for 3 months improved in last 3 weeks with topical in heat down to 3/10 pain along with 1 month right low back pain and into right groin -with ovarian cancer history get thoracic spine films as well as right hip films -start physical therapy for both issues as already improving some for back and I wonder if her hip issues could be related to changing gait to guard her back -if no improvement within several weeks refer to sports medicine or sooner if worsens  #Parotid gland biopsy scheduled 12/31/2022-incidental finding on June 2024 MRI of the brain led to ENT referral -Discussed fine to still take cholesterol medicine before this but can also hold   #hypertension S: medication: Amlodipine 5 mg, valsartan 160 mg. Has actually been on 10 mg per last discussion with DrShari Prows but she is now leaving system so we will prescribe this.  Home readings #s: similar readings at home BP Readings from Last 3 Encounters:  12/29/22 124/60  12/22/22 (!) 128/59  10/25/22 119/63  A/P: stable- continue current medicines   #hyperlipidemia #aortic atherosclerosis  S: Medication: Rosuvastatin 5 mg Lab Results  Component Value Date   CHOL 122 06/30/2021   HDL 45 06/30/2021   LDLCALC 60 06/30/2021   LDLDIRECT 118.0 03/14/2015   TRIG 85 06/30/2021   CHOLHDL 2.7 06/30/2021   A/P: hopefully stable- update lipid panel. Continue current meds for now   Aortic atherosclerosis (presumed stable)- LDL goal ideally <70 - has  been well under this on last check   #hypothyroidism S: compliant On thyroid medication-levothyroxine 50 mcg Lab Results  Component Value Date   TSH 1.65 09/02/2022   A/P:hopefully stable- update tsh today. Continue current meds for now  - checking due to fatigue and weight gain -also check B12 with fatigue  # Hyperglycemia/insulin resistance/prediabetes S:  Medication: none Exercise and diet- worried after appendicitis with less walking that this could be trending back up. Previously cut sugar and carbs Lab Results  Component Value Date   HGBA1C 5.7 12/13/2020   HGBA1C 5.8 09/03/2020   HGBA1C 6.0 12/23/2019  A/P: hopefully stable or improved- update a1c today. Continue efforts for healthy eating and regular exercise- we need  to work through hip/back issues  #Vitamin D deficiency S: Medication: vitamin D 5000 units every other day Last vitamin D Lab Results  Component Value Date   VD25OH 47.30 09/02/2022  A/P: stable- continue current medicines    # Health maintenance-Pap smear and mammogram requested   Recommended follow up: Return for as needed for new, worsening, persistent symptoms. Future Appointments  Date Time Provider Department Center  12/31/2022  1:00 PM [facility] 2 [facility]  01/07/2023  9:15 AM [facility]  02/25/2023  9:00 AM [facility] None  02/26/2023  2:30 PM Carver Fila, MD [facility] None  06/21/2023  9:30 AM [facility] None  06/24/2023  9:20 AM [name], MD [facility] None    Lab/Order associations:   ICD-10-CM   1. Hyperlipidemia, unspecified hyperlipidemia type  E78.5 Lipid panel    amLODipine (NORVASC) 10 MG tablet    rosuvastatin (CRESTOR) 5 MG tablet    2. Prediabetes  R73.03 Hemoglobin A1c    3. Screening for diabetes mellitus  Z13.1 Hemoglobin A1c    4. Hypothyroidism, unspecified type  E03.9 TSH    5. Fatigue, unspecified type  R53.83 Vitamin B12    6. Medication management  Z79.899  amLODipine (NORVASC) 10 MG tablet    rosuvastatin (CRESTOR) 5 MG tablet    7. History of chemotherapy  Z92.21 amLODipine (NORVASC) 10 MG tablet    rosuvastatin (CRESTOR) 5 MG tablet    8. Chronic right-sided thoracic back pain  M54.6 DG Thoracic Spine W/Swimmers   G89.29 Ambulatory referral to Physical Therapy    9. Right hip pain  M25.551 DG HIP UNILAT W OR W/O PELVIS 2-3 VIEWS RIGHT    Ambulatory referral to Physical Therapy    10. History of ovarian cancer  Z85.43 DG Thoracic Spine W/Swimmers    DG HIP UNILAT W OR W/O PELVIS 2-3 VIEWS RIGHT    11. Aortic atherosclerosis (HCC)  I70.0       Meds ordered this encounter  Medications   amLODipine (NORVASC) 10 MG tablet    Sig: Take 1 tablet (10 mg total) by mouth daily.    Dispense:  90 tablet    Refill:  3   rosuvastatin (CRESTOR) 5 MG tablet    Sig: Take 1 tablet (5 mg total) by mouth daily.    Dispense:  90 tablet    Refill:  3   valsartan (DIOVAN) 160 MG tablet    Sig: Take 1 tablet (160 mg total) by mouth daily.    Dispense:  90 tablet    Refill:  3    Return precautions advised.  Tana Conch, MD

## 2022-12-29 NOTE — Patient Instructions (Addendum)
Schedule nurse visit at check out [facility].  On way out see if you can schedule physical therapy visit   If no improvement with physical therapy within a few sessions lets look at sports medicine consult  Schedule a lab visit at the check out desk within 2 weeks. Return for future fasting labs meaning nothing but water after midnight please. Ok to take your medications with water.   Please go to [facility]  - located [address] across the street from [place] - in the basement - Hours: 8:30-5:00 PM M-F (with lunch from 12:30- 1 PM). You do NOT need an appointment.    Recommended follow up: Return for as needed for new, worsening, persistent symptoms.  Maybe schedule regular physical in 6-9 months after labs- do encourage efforts for weight loss again with prediabetes

## 2022-12-30 ENCOUNTER — Ambulatory Visit (HOSPITAL_COMMUNITY): Payer: BC Managed Care – PPO

## 2022-12-30 ENCOUNTER — Other Ambulatory Visit: Payer: Self-pay | Admitting: Radiology

## 2022-12-30 DIAGNOSIS — R591 Generalized enlarged lymph nodes: Secondary | ICD-10-CM

## 2022-12-31 ENCOUNTER — Ambulatory Visit (HOSPITAL_COMMUNITY)
Admission: RE | Admit: 2022-12-31 | Discharge: 2022-12-31 | Disposition: A | Discharge status: Home / Self Care | Payer: BC Managed Care – PPO | Source: Ambulatory Visit | Attending: Otolaryngology | Admitting: Otolaryngology

## 2022-12-31 ENCOUNTER — Encounter (HOSPITAL_COMMUNITY): Payer: Self-pay

## 2022-12-31 ENCOUNTER — Other Ambulatory Visit: Payer: Self-pay

## 2022-12-31 DIAGNOSIS — K118 Other diseases of salivary glands: Secondary | ICD-10-CM

## 2022-12-31 DIAGNOSIS — R591 Generalized enlarged lymph nodes: Secondary | ICD-10-CM

## 2022-12-31 DIAGNOSIS — R519 Headache, unspecified: Secondary | ICD-10-CM | POA: Diagnosis not present

## 2022-12-31 DIAGNOSIS — C569 Malignant neoplasm of unspecified ovary: Secondary | ICD-10-CM | POA: Insufficient documentation

## 2022-12-31 DIAGNOSIS — E785 Hyperlipidemia, unspecified: Secondary | ICD-10-CM | POA: Insufficient documentation

## 2022-12-31 MED ORDER — SODIUM CHLORIDE 0.9 % IV SOLN: INTRAVENOUS | Status: DC

## 2022-12-31 MED ORDER — LIDOCAINE HCL (PF) 1 % IJ SOLN
5.0000 mL | Freq: Once | INTRAMUSCULAR | Status: AC
  Administered 2022-12-31: 5 mL via INTRADERMAL

## 2022-12-31 MED ORDER — LIDOCAINE HCL (PF) 1 % IJ SOLN
INTRAMUSCULAR | Status: DC | PRN
  Administered 2022-12-31: 5 mL via SUBCUTANEOUS

## 2022-12-31 MED ORDER — MIDAZOLAM HCL 2 MG/2ML IJ SOLN
INTRAMUSCULAR | Status: AC
  Filled 2022-12-31: qty 2

## 2022-12-31 MED ORDER — FENTANYL CITRATE (PF) 100 MCG/2ML IJ SOLN
INTRAMUSCULAR | Status: DC | PRN
  Administered 2022-12-31: 50 ug via INTRAVENOUS
  Administered 2022-12-31: 25 ug via INTRAVENOUS

## 2022-12-31 MED ORDER — MIDAZOLAM HCL 2 MG/2ML IJ SOLN
INTRAMUSCULAR | Status: DC | PRN
  Administered 2022-12-31: .5 mg via INTRAVENOUS
  Administered 2022-12-31: 1 mg via INTRAVENOUS

## 2022-12-31 MED ORDER — FENTANYL CITRATE (PF) 100 MCG/2ML IJ SOLN
INTRAMUSCULAR | Status: AC
  Filled 2022-12-31: qty 2

## 2022-12-31 NOTE — H&P (Signed)
Chief Complaint: Patient was seen in consultation today for parotid mass  Referring Physician(s): Rosen,Jefry  Supervising Physician: Simonne Come  Patient Status: [facility] - Out-pt  History of Present Illness: Brooke Khan is a 57 y.o. female with past medical history of HLD, ovarian cancer s/p treatment who recently underwent imaging for new onset left-sided headache.  She was found to have a right-sided parotid mass and was recommended for biopsy by ENT.  Case reviewed by Dr. Deanne Coffer who approved the patient for the procedure.    Brooke Khan presents to [facility] today in her usual state of health.  She has been NPO and requests sedation for the procedure today due to fear of needles.  Her husband is available for post-procedure care and transportation.   Past Medical History:  Diagnosis Date   Bradycardia    Chicken pox    Elevated blood pressure reading    Epigastric abdominal pain    Family history of breast cancer 12/26/2020   Family history of prostate cancer 12/26/2020   Heart murmur    since childhood- not always heard   Hyperglycemia    Hyperlipidemia    borderline- no meds   Hypothyroidism (acquired)    Lightheaded    ovarian ca 11/2020   Pre-diabetes    Vitamin D deficiency     Past Surgical History:  Procedure Laterality Date   CESAREAN SECTION     x2   cosmetic knee surgery     as child   LAPAROSCOPIC APPENDECTOMY N/A 10/25/2022   Procedure: APPENDECTOMY LAPAROSCOPIC;  Surgeon: Diamantina Monks, MD;  Location: [facility] OR;  Service: General;  Laterality: N/A;   LAPAROTOMY N/A 12/03/2020   Procedure: MINI LAPAROTOMY FOR CYST DECOMPRESSION;  Surgeon: Adolphus Birchwood, MD;  Location: [facility] ORS;  Service: Gynecology;  Laterality: N/A;   ROBOTIC ASSISTED TOTAL HYSTERECTOMY WITH BILATERAL SALPINGO OOPHERECTOMY N/A 12/03/2020   Procedure: XI ROBOTIC ASSISTED TOTAL HYSTERECTOMY WITH BILATERAL SALPINGO OOPHORECTOMY WITH PARA-AORTIC AND PELVIC LYMPHADNECTOMY AND  OMENTECTOMY;  Surgeon: Adolphus Birchwood, MD;  Location: [facility] ORS;  Service: Gynecology;  Laterality: N/A;    Allergies: Codeine  Medications: Prior to Admission medications   Medication Sig Start Date End Date Taking? Authorizing Provider  acetaminophen (TYLENOL) 500 MG tablet Take 2 tablets (1,000 mg total) by mouth 4 (four) times daily. 10/25/22 10/25/23 Yes Lovick, Lennie Odor, MD  amLODipine (NORVASC) 10 MG tablet Take 1 tablet (10 mg total) by mouth daily. 12/29/22  Yes [name], MD  calcium carbonate (TUMS - DOSED IN MG ELEMENTAL CALCIUM) 500 MG chewable tablet Chew 1 tablet by mouth daily.   Yes [provider]  Cholecalciferol (VITAMIN D3) 1.25 MG (50000 UT) CAPS Vitamin D3  5,000 iu qd   Yes [provider]  ibuprofen (ADVIL) 600 MG tablet Take 1 tablet (600 mg total) by mouth 4 (four) times daily. 10/25/22  Yes Diamantina Monks, MD  levothyroxine (SYNTHROID) 50 MCG tablet TAKE 1 TABLET BY MOUTH EVERY DAY 02/13/22  Yes [name], MD  linaclotide [facility]) 145 MCG CAPS capsule Take 1 capsule (145 mcg total) by mouth daily before breakfast. Patient taking differently: Take 145 mcg by mouth as needed. 01/09/22 01/04/23 Yes Danis, Andreas Blower, MD  rosuvastatin (CRESTOR) 5 MG tablet Take 1 tablet (5 mg total) by mouth daily. 12/29/22  Yes [name], MD  valsartan (DIOVAN) 160 MG tablet Take 1 tablet (160 mg total) by mouth daily. 12/29/22  Yes [name], MD  vitamin B-12 (CYANOCOBALAMIN) 1000 MCG tablet Take 1,000 mcg by mouth in the morning.   Yes [provider]  vitamin C (ASCORBIC ACID) 500 MG tablet Take 500 mg by mouth in the morning.   Yes [provider]  fluorometholone (FML) 0.1 % ophthalmic suspension 1 drop 4 (four) times daily. 09/01/22   [provider]  LORazepam (ATIVAN) 0.5 MG tablet Take 1 tablet (0.5 mg total) by mouth 2 (two) times daily as needed for anxiety (30 minutes before imaging.  Do not drive for 8 hours  after use). Patient not taking: Reported on 12/29/2022 10/29/22   [name], MD     Family History  Problem Relation Age of Onset   Elevated Lipids Mother    Prostate cancer Father        dx 92s   Elevated Lipids Father    Heart disease Father        mid 40s. had been remote smoker.    Hypertension Father    Diabetes Father        mid 33s   Dementia Father        vascular based   Skin cancer Father        SCC, dx after 107   Healthy Brother    Breast cancer Maternal Aunt 40   Prostate cancer Maternal Uncle        dx 62s   Lymphoma Paternal Uncle        dx after 50   Diabetes Maternal Grandmother    Arthritis Maternal Grandfather    Diabetes Paternal Grandfather    Cancer Cousin        unknown type; ?throat ?lymphoma d. 24   Colon cancer Neg Hx    Colon polyps Neg Hx    Esophageal cancer Neg Hx    Rectal cancer Neg Hx    Stomach cancer Neg Hx     Social History   Socioeconomic History   Marital status: Married    Spouse name: Not on file   Number of children: 2   Years of education: Not on file   Highest education level: Not on file  Occupational History   Occupation: realtor  Tobacco Use   Smoking status: Never   Smokeless tobacco: Never  Vaping Use   Vaping status: Never Used  Substance and Sexual Activity   Alcohol use: Yes    Comment: once a month   Drug use: No   Sexual activity: Yes    Partners: Male    Comment: husband  Other Topics Concern   Not on file  Social History Narrative   Married 30 years October 2020. Daughter went to [place]. Son went to [place] 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       Hobbies: tennis, plays mahjong in womens group      Social Determinants of Health   Financial Resource Strain: Not on file  Food Insecurity: Low Risk  (12/03/2022)   Received from [facility]   Food vital  sign    Within the past 12 months, you worried that your food would run out before you got money to buy more: Never true    Within the past 12 months, the food you bought just didn't last and you didn't have money to get more. : Never true  Transportation Needs: Not on  file (12/03/2022)  Physical Activity: Not on file  Stress: Not on file  Social Connections: Not on file     Review of Systems: A 12 point ROS discussed and pertinent positives are indicated in the HPI above.  All other systems are negative.  Review of Systems  Constitutional:  Negative for fatigue and fever.  Respiratory:  Negative for cough and shortness of breath.   Cardiovascular:  Negative for chest pain.  Gastrointestinal:  Negative for abdominal pain, nausea and vomiting.  Musculoskeletal:  Negative for back pain.  Psychiatric/Behavioral:  Negative for behavioral problems and confusion.     Vital Signs: BP 125/72   Pulse 64   Temp (!) 97.5 F (36.4 C) (Temporal)   Resp 18   Ht 5' 2.5" (1.588 m)   Wt 178 lb (80.7 kg)   LMP 05/11/2014   SpO2 98%   BMI 32.04 kg/m   Physical Exam Vitals and nursing note reviewed.  Constitutional:      General: She is not in acute distress.    Appearance: Normal appearance. She is not ill-appearing.  HENT:     Mouth/Throat:     Mouth: Mucous membranes are moist.     Pharynx: Oropharynx is clear.  Cardiovascular:     Rate and Rhythm: Normal rate and regular rhythm.  Pulmonary:     Effort: Pulmonary effort is normal.     Breath sounds: Normal breath sounds.  Abdominal:     General: Abdomen is flat.     Palpations: Abdomen is soft.  Musculoskeletal:     Cervical back: Normal range of motion and neck supple.  Skin:    General: Skin is warm and dry.  Neurological:     General: No focal deficit present.     Mental Status: She is alert and oriented to person, place, and time. Mental status is at baseline.  Psychiatric:        Mood and Affect: Mood normal.         Behavior: Behavior normal.        Thought Content: Thought content normal.        Judgment: Judgment normal.      MD Evaluation Airway: WNL Heart: WNL Abdomen: WNL Chest/ Lungs: WNL ASA  Classification: 3 Mallampati/Airway Score: Two   Imaging: No results found.  Labs:  CBC: Recent Labs    09/02/22 1553 09/16/22 0807 10/25/22 0753 12/17/22 1007  WBC 6.7 7.4 13.6* 4.5  HGB 14.4 13.1 14.3 13.5  HCT 42.5 39.9 42.8 40.6  PLT 296.0 254 267 294    COAGS: No results for input(s): "INR", "APTT" in the last 8760 hours.  BMP: Recent Labs    06/19/22 1008 09/02/22 1553 09/16/22 0807 10/25/22 0753 12/17/22 1007  NA 143 142 142 138 142  K 4.5 3.9 3.9 3.7 4.2  CL 108 105 108 102 107  CO2 28 27 28 25 27   GLUCOSE 110* 102* 100* 144* 112*  BUN 15 17 20 17 17   CALCIUM 10.0 10.0 9.6 9.5 9.4  CREATININE 0.80 0.98 0.86 0.78 0.79  GFRNONAA >60  --  >60 >60 >60    LIVER FUNCTION TESTS: Recent Labs    09/02/22 1553 09/16/22 0807 10/25/22 0753 12/17/22 1007  BILITOT 0.3 0.5 0.6 0.3  AST 21 18 25 25   ALT 35 25 31 47*  ALKPHOS 74 74 70 75  PROT 7.6 7.2 7.2 7.3  ALBUMIN 4.7 4.5 4.1 4.5    TUMOR MARKERS: No results for input(s): "AFPTM", "  CEA", "CA199", "CHROMGRNA" in the last 8760 hours.  Assessment and Plan: Patient with past medical history of ovarian cancer, HLD presents with complaint of incidental finding of parotid mass on MRI.  IR consulted for parotid mass biopsy at the request of Dr. Pollyann Kennedy. Case reviewed by Dr. Deanne Coffer who approves patient for procedure.  Patient presents today in their usual state of health.  She has been NPO and is not currently on blood thinners.   Risks and benefits of biopsy was discussed with the patient and/or patient's family including, but not limited to bleeding, infection, damage to adjacent structures or low yield requiring additional tests.  All of the questions were answered and there is agreement to proceed.  Consent  signed and in chart.  Thank you for this interesting consult.  I greatly enjoyed meeting Brooke Khan and look forward to participating in their care.  A copy of this report was sent to the requesting provider on this date.  Electronically Signed: Hoyt Koch, PA 12/31/2022, 1:19 PM   I spent a total of  30 Minutes   in face to face in clinical consultation, greater than 50% of which was counseling/coordinating care for parotid mass biopsy.

## 2022-12-31 NOTE — Procedures (Signed)
Pre Procedure Dx: Indeterminate right parotid nodule Post Procedural Dx: Same  Technically successful US guided FNA of indeterminate right parotid nodule.  EBL: Trace No immediate complications.   Katherina Right, MD Pager #: [phone]

## 2023-01-01 ENCOUNTER — Ambulatory Visit (INDEPENDENT_AMBULATORY_CARE_PROVIDER_SITE_OTHER)
Admission: RE | Admit: 2023-01-01 | Discharge: 2023-01-01 | Disposition: A | Discharge status: Home / Self Care | Payer: BC Managed Care – PPO | Source: Ambulatory Visit | Attending: Family Medicine | Admitting: Family Medicine

## 2023-01-01 DIAGNOSIS — Z8543 Personal history of malignant neoplasm of ovary: Secondary | ICD-10-CM | POA: Diagnosis not present

## 2023-01-01 DIAGNOSIS — M25551 Pain in right hip: Secondary | ICD-10-CM

## 2023-01-01 DIAGNOSIS — M546 Pain in thoracic spine: Secondary | ICD-10-CM

## 2023-01-01 DIAGNOSIS — G8929 Other chronic pain: Secondary | ICD-10-CM

## 2023-01-01 DIAGNOSIS — M160 Bilateral primary osteoarthritis of hip: Secondary | ICD-10-CM | POA: Diagnosis not present

## 2023-01-01 DIAGNOSIS — M47814 Spondylosis without myelopathy or radiculopathy, thoracic region: Secondary | ICD-10-CM | POA: Diagnosis not present

## 2023-01-01 LAB — CYTOLOGY - NON PAP

## 2023-01-07 ENCOUNTER — Other Ambulatory Visit (INDEPENDENT_AMBULATORY_CARE_PROVIDER_SITE_OTHER): Payer: BC Managed Care – PPO

## 2023-01-07 DIAGNOSIS — R7303 Prediabetes: Secondary | ICD-10-CM | POA: Diagnosis not present

## 2023-01-07 DIAGNOSIS — Z131 Encounter for screening for diabetes mellitus: Secondary | ICD-10-CM

## 2023-01-07 DIAGNOSIS — E785 Hyperlipidemia, unspecified: Secondary | ICD-10-CM | POA: Diagnosis not present

## 2023-01-07 DIAGNOSIS — R5383 Other fatigue: Secondary | ICD-10-CM | POA: Diagnosis not present

## 2023-01-07 DIAGNOSIS — E039 Hypothyroidism, unspecified: Secondary | ICD-10-CM

## 2023-01-07 LAB — LIPID PANEL
Cholesterol: 108 mg/dL (ref 0–200)
HDL: 38.2 mg/dL — ABNORMAL LOW (ref >39.00)
LDL Cholesterol: 43 mg/dL (ref 0–99)
NonHDL: 69.51
Total CHOL/HDL Ratio: 3
Triglycerides: 131 mg/dL (ref 0.0–149.0)
VLDL: 26.2 mg/dL (ref 0.0–40.0)

## 2023-01-07 LAB — HEMOGLOBIN A1C: Hgb A1c MFr Bld: 5.8 % (ref 4.6–6.5)

## 2023-01-08 LAB — TSH: TSH: 3.44 u[IU]/mL (ref 0.35–5.50)

## 2023-01-08 LAB — VITAMIN B12: Vitamin B-12: 609 pg/mL (ref 211–911)

## 2023-01-18 ENCOUNTER — Ambulatory Visit: Payer: BC Managed Care – PPO | Admitting: Physical Therapy

## 2023-01-18 DIAGNOSIS — M546 Pain in thoracic spine: Secondary | ICD-10-CM

## 2023-01-18 NOTE — Therapy (Signed)
OUTPATIENT PHYSICAL THERAPY LOWER EXTREMITY EVALUATION   Patient Name: Brooke Khan MRN: [id number] DOB:15-May-1965, 57 y.o., female Today's Date: 01/18/2023  END OF SESSION:  PT End of Session - 01/24/23 1609     Visit Number 1    Number of Visits 16    Date for PT Re-Evaluation 03/15/23    Authorization Type [employer]    PT Start Time 1105    PT Stop Time 1145    PT Time Calculation (min) 40 min    Activity Tolerance Patient tolerated treatment well    Behavior During Therapy WFL for tasks assessed/performed             Past Medical History:  Diagnosis Date   Bradycardia    Chicken pox    Elevated blood pressure reading    Epigastric abdominal pain    Family history of breast cancer 12/26/2020   Family history of prostate cancer 12/26/2020   Heart murmur    since childhood- not always heard   Hyperglycemia    Hyperlipidemia    borderline- no meds   Hypothyroidism (acquired)    Lightheaded    ovarian ca 11/2020   Pre-diabetes    Vitamin D deficiency    Past Surgical History:  Procedure Laterality Date   CESAREAN SECTION     x2   cosmetic knee surgery     as child   LAPAROSCOPIC APPENDECTOMY N/A 10/25/2022   Procedure: APPENDECTOMY LAPAROSCOPIC;  Surgeon: Diamantina Monks, MD;  Location: [facility];  Service: General;  Laterality: N/A;   LAPAROTOMY N/A 12/03/2020   Procedure: MINI LAPAROTOMY FOR CYST DECOMPRESSION;  Surgeon: Adolphus Birchwood, MD;  Location: [facility];  Service: Gynecology;  Laterality: N/A;   ROBOTIC ASSISTED TOTAL HYSTERECTOMY WITH BILATERAL SALPINGO OOPHERECTOMY N/A 12/03/2020   Procedure: XI ROBOTIC ASSISTED TOTAL HYSTERECTOMY WITH BILATERAL SALPINGO OOPHORECTOMY WITH PARA-AORTIC AND PELVIC LYMPHADNECTOMY AND OMENTECTOMY;  Surgeon: Adolphus Birchwood, MD;  Location: [facility];  Service: Gynecology;  Laterality: N/A;   Patient Active Problem List   Diagnosis Date Noted   Aortic atherosclerosis (HCC) 12/29/2022   Obesity, Class I, BMI 30-34.9 06/23/2022    Gallbladder mass 06/23/2022   Acute back pain 06/19/2021   Mild anxiety 04/07/2021   Genetic testing 01/21/2021   Family history of breast cancer 12/26/2020   Family history of prostate cancer 12/26/2020   Other constipation 12/15/2020   Elevated CA-125 12/03/2020   Ovarian cancer, right (HCC)    Elevated blood pressure reading 02/01/2019   Hypothyroidism, unspecified 10/04/2018   Vitamin D deficiency 10/04/2018    PCP: Tana Conch  REFERRING PROVIDER: Tana Conch  REFERRING DIAG: R thoracic pain  THERAPY DIAG:  Pain in thoracic spine  Rationale for Evaluation and Treatment: Rehabilitation  ONSET DATE:  May 2024.   SUBJECTIVE:   SUBJECTIVE STATEMENT: Eval:  Pt states R low  pain, had CT in may- started after that. June- she had appendicitis,  feels some of that pain never went away.  Now: pain with walking:  also has some anterior hip pain- but has been better more recently.  Main complaint: Mid R thoracic pain, into R center t spine.   More rare- in low back or QL.  Aches pretty constantly, sitting may be worse Heating pad, better, and laying down, She is sleeping well, minimal pain.  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   States when she has Severe pain- feels lightheaded- has happened 5-6 times.   PERTINENT HISTORY: Ovarian  CA, hysterectomy, appendectomy,   PAIN:  Are you having pain? Yes: NPRS scale: 3-6 /10 Pain location: R thoracic/lumbar region Pain description: sore, constant  Aggravating factors: activity, sitting, constant  Relieving factors: none stated    PRECAUTIONS: None  WEIGHT BEARING RESTRICTIONS: No  FALLS:  Has patient fallen in last 6 months? No   PLOF: Independent  PATIENT GOALS:  Decreased pain   NEXT MD VISIT:   OBJECTIVE:   DIAGNOSTIC FINDINGS:   PATIENT SURVEYS:  FOTO:   COGNITION: Overall cognitive status: Within functional limits for tasks assessed     SENSATION: WFL  EDEMA:    POSTURE:  Posteriorly:  R shoulder slightly lower, hips and ASIS appear even.   PALPATION:  Increased tightness in R mid and low thoracic paraspinals, no pain to palpate today No pain to palpate or mobilize ribs, no pain with PA s  to thoracic spine.  States general/constant soreness in this region.   LOWER EXTREMITY ROM:  Lumbar: WFL Hips: WFL Knees: WFL   LOWER EXTREMITY MMT:  MMT Left eval Right  eval  Hip flexion 4 4  Hip extension    Hip abduction 4 4  Hip adduction    Hip internal rotation    Hip external rotation    Knee flexion    Knee extension    Ankle dorsiflexion    Ankle plantarflexion    Ankle inversion    Ankle eversion     (Blank rows = not tested)  LOWER EXTREMITY SPECIAL TESTS:     TODAY'S TREATMENT:                                                                                                                              DATE:   01/18/2023 Therapeutic Exercise: Aerobic: Supine:  shoulder flexion/cane for thoracic/ lat stretch x 10;  Seated: Standing: Stretches:   LTR x 10;  standing R QL stretch 20 sec x 3 on R;  Neuromuscular Re-education: Manual Therapy: Therapeutic Activity: Self Care:    PATIENT EDUCATION:  Education details: PT POC, Exam findings, HEP Person educated: Patient Education method: Explanation, Demonstration, Tactile cues, Verbal cues, and Handouts Education comprehension: verbalized understanding, returned demonstration, verbal cues required, tactile cues required, and needs further education   HOME EXERCISE PROGRAM: Access Code: [email or web address] URL: [email or web address] Date: 01/24/2023 Prepared by: Sedalia Muta  Exercises - Supine Lower Trunk Rotation  - 2 x daily - 10 reps - 5 hold - Supine Shoulder Flexion Extension AAROM with Dowel  - 1-2 x daily - 1 sets - 10 reps - Standing Sidebending with Chair Support  - 2 x daily - 3 reps - 30 hold  ASSESSMENT:  CLINICAL IMPRESSION: Patient presents  with primary complaint of  pain in thoracic region. She has increased muscle tension on R, and some pain at central t-spine. She has minimal pain to palpate today, but notes constant soreness that has been ongoing.  Pt with decreased ability for  full functional activities. Pt will  benefit from skilled PT to improve deficits and pain and to return to PLOF.   OBJECTIVE IMPAIRMENTS: decreased activity tolerance, decreased mobility, decreased ROM, decreased strength, increased muscle spasms, impaired flexibility, improper body mechanics, and pain.   ACTIVITY LIMITATIONS: carrying, lifting, bending, sitting, standing, squatting, hygiene/grooming, and locomotion level  PARTICIPATION LIMITATIONS: meal prep, cleaning, laundry, driving, shopping, community activity, occupation, and yard work  PERSONAL FACTORS:  none  are also affecting patient's functional outcome.   REHAB POTENTIAL: Good  CLINICAL DECISION MAKING: Stable/uncomplicated  EVALUATION COMPLEXITY: Low   GOALS: Goals reviewed with patient? Yes  SHORT TERM GOALS: Target date: 02/01/23  Pt to be independent with initial HEP  Goal status: INITIAL   LONG TERM GOALS: Target date: 114/24  Pt to be independent with final HEP  Goal status: INITIAL  2.  Pt to report decreased pain in thoracic and lumbar spine to be 0-2/10  Goal status: INITIAL  3.  Pt to demo improved strength of hips and core to be [place] to improve pain and mobility.   Goal status: INITIAL  4.  Pt to demo soft tissue limitations in thoracic musculature, to be [facility], to decrease pain.   Goal status: INITIAL    PLAN:  PT FREQUENCY: 1-2x/week  PT DURATION: 8 weeks  PLANNED INTERVENTIONS: Therapeutic exercises, Therapeutic activity, Neuromuscular re-education, Patient/Family education, Self Care, Joint mobilization, Joint manipulation, Stair training, Orthotic/Fit training, DME instructions, Aquatic Therapy, Dry Needling, Electrical stimulation, Cryotherapy,  Moist heat, Taping, Ultrasound, Ionotophoresis 4mg /ml Dexamethasone, Manual therapy,  Vasopneumatic device, Traction, Spinal manipulation, Spinal mobilization,Balance training, Gait training,   PLAN FOR NEXT SESSION:    Sedalia Muta, PT, DPT 4:11 PM  01/24/23

## 2023-01-24 ENCOUNTER — Encounter: Payer: Self-pay | Admitting: Physical Therapy

## 2023-02-02 ENCOUNTER — Ambulatory Visit: Payer: BC Managed Care – PPO | Admitting: Physical Therapy

## 2023-02-02 ENCOUNTER — Encounter: Payer: Self-pay | Admitting: Physical Therapy

## 2023-02-02 DIAGNOSIS — M546 Pain in thoracic spine: Secondary | ICD-10-CM | POA: Diagnosis not present

## 2023-02-02 NOTE — Therapy (Signed)
OUTPATIENT PHYSICAL THERAPY LOWER EXTREMITY TREATMENT   Patient Name: Brooke Khan MRN: [id number] DOB:[birth year], 57 y.o., female Today's Date: 02/02/2023  END OF SESSION:  PT End of Session - 02/02/23 1308     Visit Number 2    Number of Visits 16    Date for PT Re-Evaluation 03/15/23    Authorization Type [employer]    PT Start Time 1309    PT Stop Time 1350    PT Time Calculation (min) 41 min    Activity Tolerance Patient tolerated treatment well    Behavior During Therapy WFL for tasks assessed/performed             Past Medical History:  Diagnosis Date   Bradycardia    Chicken pox    Elevated blood pressure reading    Epigastric abdominal pain    Family history of breast cancer 12/26/2020   Family history of prostate cancer 12/26/2020   Heart murmur    since childhood- not always heard   Hyperglycemia    Hyperlipidemia    borderline- no meds   Hypothyroidism (acquired)    Lightheaded    ovarian ca 11/2020   Pre-diabetes    Vitamin D deficiency    Past Surgical History:  Procedure Laterality Date   CESAREAN SECTION     x2   cosmetic knee surgery     as child   LAPAROSCOPIC APPENDECTOMY N/A 10/25/2022   Procedure: APPENDECTOMY LAPAROSCOPIC;  Surgeon: Diamantina Monks, MD;  Location: [facility];  Service: General;  Laterality: N/A;   LAPAROTOMY N/A 12/03/2020   Procedure: MINI LAPAROTOMY FOR CYST DECOMPRESSION;  Surgeon: Adolphus Birchwood, MD;  Location: [facility];  Service: Gynecology;  Laterality: N/A;   ROBOTIC ASSISTED TOTAL HYSTERECTOMY WITH BILATERAL SALPINGO OOPHERECTOMY N/A 12/03/2020   Procedure: XI ROBOTIC ASSISTED TOTAL HYSTERECTOMY WITH BILATERAL SALPINGO OOPHORECTOMY WITH PARA-AORTIC AND PELVIC LYMPHADNECTOMY AND OMENTECTOMY;  Surgeon: Adolphus Birchwood, MD;  Location: [facility];  Service: Gynecology;  Laterality: N/A;   Patient Active Problem List   Diagnosis Date Noted   Aortic atherosclerosis (HCC) 12/29/2022   Obesity, Class I, BMI 30-34.9 06/23/2022    Gallbladder mass 06/23/2022   Acute back pain 06/19/2021   Mild anxiety 04/07/2021   Genetic testing 01/21/2021   Family history of breast cancer 12/26/2020   Family history of prostate cancer 12/26/2020   Other constipation 12/15/2020   Elevated CA-125 12/03/2020   Ovarian cancer, right (HCC)    Elevated blood pressure reading 02/01/2019   Hypothyroidism, unspecified 10/04/2018   Vitamin D deficiency 10/04/2018    PCP: Tana Conch  REFERRING PROVIDER: Tana Conch  REFERRING DIAG: R thoracic pain  THERAPY DIAG:  Pain in thoracic spine  Rationale for Evaluation and Treatment: Rehabilitation  ONSET DATE:  May 2024.   SUBJECTIVE:   SUBJECTIVE STATEMENT: Pt states she thinks pain may be a bit better. Has not had bad episode of pain. Has been able to do some of HEP.   Eval:  Pt states R low  pain, had CT in may- started after that. June- she had appendicitis,  feels some of that pain never went away.  Now: pain with walking:  also has some anterior hip pain- but has been better more recently.  Main complaint: Mid R thoracic pain, into R center t spine.   More rare- in low back or QL.  Aches pretty constantly, sitting may be worse Heating pad, better, and laying down, She is sleeping well, minimal pain.  Pt works as  realtor. Was able to Tennis: did not aggravate.   States when she has Severe pain- feels lightheaded- has happened 5-6 times.   PERTINENT HISTORY: Ovarian CA, hysterectomy, appendectomy,   PAIN:  Are you having pain? Yes: NPRS scale: 3-6 /10 Pain location: R thoracic/lumbar region Pain description: sore, constant  Aggravating factors: activity, sitting, constant  Relieving factors: none stated    PRECAUTIONS: None  WEIGHT BEARING RESTRICTIONS: No  FALLS:  Has patient fallen in last 6 months? No   PLOF: Independent  PATIENT GOALS:  Decreased pain   NEXT MD VISIT:   OBJECTIVE:   DIAGNOSTIC FINDINGS:   PATIENT SURVEYS:  FOTO:    COGNITION: Overall cognitive status: Within functional limits for tasks assessed     SENSATION: WFL  EDEMA:   POSTURE:  Posteriorly:  R shoulder slightly lower, hips and ASIS appear even.   PALPATION:  Increased tightness in R mid and low thoracic paraspinals, no pain to palpate today No pain to palpate or mobilize ribs, no pain with PA s  to thoracic spine.  States general/constant soreness in this region.   LOWER EXTREMITY ROM:  Lumbar: WFL Hips: WFL Knees: WFL   LOWER EXTREMITY MMT:  MMT Left eval Right  eval  Hip flexion 4 4  Hip extension    Hip abduction 4 4  Hip adduction    Hip internal rotation    Hip external rotation    Knee flexion    Knee extension    Ankle dorsiflexion    Ankle plantarflexion    Ankle inversion    Ankle eversion     (Blank rows = not tested)  LOWER EXTREMITY SPECIAL TESTS:     TODAY'S TREATMENT:                                                                                                                              DATE:   02/02/2023 Therapeutic Exercise: Aerobic: Supine:  shoulder flexion/cane for thoracic/ lat stretch x 10;  shoulder protraction x 10, slow ;  TA contraction with education and practice for achieving active contraction ;  SLR x 10 bil with TA;  Seated:  Standing: Stretches:   LTR x 10;  standing R QL stretch 20 sec x 3 on R; Cat/cow x 10, slow  Neuromuscular Re-education: Manual Therapy: Therapeutic Activity: Self Care:    PATIENT EDUCATION:  Education details: updated and reviewed HEP Person educated: Patient Education method: Explanation, Demonstration, Tactile cues, Verbal cues, and Handouts Education comprehension: verbalized understanding, returned demonstration, verbal cues required, tactile cues required, and needs further education   HOME EXERCISE PROGRAM: Access Code: [email or web address]   ASSESSMENT:  CLINICAL IMPRESSION: 02/02/2023 Reviewed HEP for motions to open and stretch t-spine. R  thoracic muscles are sore to touch and very over active. Added education and practice for TA contraction today. Pt with much difficulty this achieving contraction, and has much cramping and soreness in thoracic spine muscles with attempts for  contraction. She will benefit from ongoing work on this.   Eval: Patient presents with primary complaint of  pain in thoracic region. She has increased muscle tension on R, and some pain at central t-spine. She has minimal pain to palpate today, but notes constant soreness that has been ongoing.  Pt with decreased ability for full functional activities. Pt will  benefit from skilled PT to improve deficits and pain and to return to PLOF.   OBJECTIVE IMPAIRMENTS: decreased activity tolerance, decreased mobility, decreased ROM, decreased strength, increased muscle spasms, impaired flexibility, improper body mechanics, and pain.   ACTIVITY LIMITATIONS: carrying, lifting, bending, sitting, standing, squatting, hygiene/grooming, and locomotion level  PARTICIPATION LIMITATIONS: meal prep, cleaning, laundry, driving, shopping, community activity, occupation, and yard work  PERSONAL FACTORS:  none  are also affecting patient's functional outcome.   REHAB POTENTIAL: Good  CLINICAL DECISION MAKING: Stable/uncomplicated  EVALUATION COMPLEXITY: Low   GOALS: Goals reviewed with patient? Yes  SHORT TERM GOALS: Target date: 02/01/23  Pt to be independent with initial HEP  Goal status: INITIAL   LONG TERM GOALS: Target date: 114/24  Pt to be independent with final HEP  Goal status: INITIAL  2.  Pt to report decreased pain in thoracic and lumbar spine to be 0-2/10  Goal status: INITIAL  3.  Pt to demo improved strength of hips and core to be [facility] to improve pain and mobility.   Goal status: INITIAL  4.  Pt to demo soft tissue limitations in thoracic musculature, to be [facility], to decrease pain.   Goal status: INITIAL    PLAN:  PT FREQUENCY:  1-2x/week  PT DURATION: 8 weeks  PLANNED INTERVENTIONS: Therapeutic exercises, Therapeutic activity, Neuromuscular re-education, Patient/Family education, Self Care, Joint mobilization, Joint manipulation, Stair training, Orthotic/Fit training, DME instructions, Aquatic Therapy, Dry Needling, Electrical stimulation, Cryotherapy, Moist heat, Taping, Ultrasound, Ionotophoresis 4mg /ml Dexamethasone, Manual therapy,  Vasopneumatic device, Traction, Spinal manipulation, Spinal mobilization,Balance training, Gait training,   PLAN FOR NEXT SESSION: manual as needed/tolerated for R thoracic paraspinals, focus on core activation and control.    Sedalia Muta, PT, DPT 1:08 PM  02/02/23

## 2023-02-08 ENCOUNTER — Encounter: Payer: Self-pay | Admitting: Physical Therapy

## 2023-02-08 ENCOUNTER — Ambulatory Visit: Payer: BC Managed Care – PPO | Admitting: Physical Therapy

## 2023-02-08 DIAGNOSIS — M546 Pain in thoracic spine: Secondary | ICD-10-CM

## 2023-02-08 NOTE — Therapy (Signed)
OUTPATIENT PHYSICAL THERAPY LOWER EXTREMITY TREATMENT   Patient Name: Marijana Donnelly MRN: [id number] DOB:1965/09/16, 57 y.o., female Today's Date: 02/08/2023  END OF SESSION:  PT End of Session - 02/08/23 1314     Visit Number 3    Number of Visits 16    Date for PT Re-Evaluation 03/15/23    Authorization Type [employer]    PT Start Time 1105    PT Stop Time 1145    PT Time Calculation (min) 40 min    Activity Tolerance Patient tolerated treatment well    Behavior During Therapy WFL for tasks assessed/performed              Past Medical History:  Diagnosis Date   Bradycardia    Chicken pox    Elevated blood pressure reading    Epigastric abdominal pain    Family history of breast cancer 12/26/2020   Family history of prostate cancer 12/26/2020   Heart murmur    since childhood- not always heard   Hyperglycemia    Hyperlipidemia    borderline- no meds   Hypothyroidism (acquired)    Lightheaded    ovarian ca 11/2020   Pre-diabetes    Vitamin D deficiency    Past Surgical History:  Procedure Laterality Date   CESAREAN SECTION     x2   cosmetic knee surgery     as child   LAPAROSCOPIC APPENDECTOMY N/A 10/25/2022   Procedure: APPENDECTOMY LAPAROSCOPIC;  Surgeon: Diamantina Monks, MD;  Location: [facility];  Service: General;  Laterality: N/A;   LAPAROTOMY N/A 12/03/2020   Procedure: MINI LAPAROTOMY FOR CYST DECOMPRESSION;  Surgeon: Adolphus Birchwood, MD;  Location: [facility];  Service: Gynecology;  Laterality: N/A;   ROBOTIC ASSISTED TOTAL HYSTERECTOMY WITH BILATERAL SALPINGO OOPHERECTOMY N/A 12/03/2020   Procedure: XI ROBOTIC ASSISTED TOTAL HYSTERECTOMY WITH BILATERAL SALPINGO OOPHORECTOMY WITH PARA-AORTIC AND PELVIC LYMPHADNECTOMY AND OMENTECTOMY;  Surgeon: Adolphus Birchwood, MD;  Location: [facility];  Service: Gynecology;  Laterality: N/A;   Patient Active Problem List   Diagnosis Date Noted   Aortic atherosclerosis (HCC) 12/29/2022   Obesity, Class I, BMI 30-34.9 06/23/2022    Gallbladder mass 06/23/2022   Acute back pain 06/19/2021   Mild anxiety 04/07/2021   Genetic testing 01/21/2021   Family history of breast cancer 12/26/2020   Family history of prostate cancer 12/26/2020   Other constipation 12/15/2020   Elevated CA-125 12/03/2020   Ovarian cancer, right (HCC)    Elevated blood pressure reading 02/01/2019   Hypothyroidism, unspecified 10/04/2018   Vitamin D deficiency 10/04/2018    PCP: Tana Conch  REFERRING PROVIDER: Tana Conch  REFERRING DIAG: R thoracic pain  THERAPY DIAG:  Pain in thoracic spine  Rationale for Evaluation and Treatment: Rehabilitation  ONSET DATE:  May 2024.   SUBJECTIVE:   SUBJECTIVE STATEMENT: Pt states minimal pain, but still feels it/sore most days.    Eval:  Pt states R low  pain, had CT in may- started after that. June- she had appendicitis,  feels some of that pain never went away.  Now: pain with walking:  also has some anterior hip pain- but has been better more recently.  Main complaint: Mid R thoracic pain, into R center t spine.   More rare- in low back or QL.  Aches pretty constantly, sitting may be worse Heating pad, better, and laying down, She is sleeping well, minimal pain.  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   States when she  has Severe pain- feels lightheaded- has happened 5-6 times.   PERTINENT HISTORY: Ovarian CA, hysterectomy, appendectomy,   PAIN:  Are you having pain? Yes: NPRS scale: 3-6 /10 Pain location: R thoracic/lumbar region Pain description: sore, constant  Aggravating factors: activity, sitting, constant  Relieving factors: none stated    PRECAUTIONS: None  WEIGHT BEARING RESTRICTIONS: No  FALLS:  Has patient fallen in last 6 months? No   PLOF: Independent  PATIENT GOALS:  Decreased pain   NEXT MD VISIT:   OBJECTIVE:   DIAGNOSTIC FINDINGS:   PATIENT SURVEYS:  FOTO:   COGNITION: Overall cognitive status: Within functional  limits for tasks assessed     SENSATION: WFL  EDEMA:   POSTURE:  Posteriorly:  R shoulder slightly lower, hips and ASIS appear even.   PALPATION:  Increased tightness in R mid and low thoracic paraspinals, no pain to palpate today No pain to palpate or mobilize ribs, no pain with PA s  to thoracic spine.  States general/constant soreness in this region.   LOWER EXTREMITY ROM:  Lumbar: WFL Hips: WFL Knees: WFL   LOWER EXTREMITY MMT:  MMT Left eval Right  eval  Hip flexion 4 4  Hip extension    Hip abduction 4 4  Hip adduction    Hip internal rotation    Hip external rotation    Knee flexion    Knee extension    Ankle dorsiflexion    Ankle plantarflexion    Ankle inversion    Ankle eversion     (Blank rows = not tested)  LOWER EXTREMITY SPECIAL TESTS:     TODAY'S TREATMENT:                                                                                                                              DATE:   02/08/23: Therapeutic Exercise: Aerobic: Supine:  shoulder flexion for thoracic/ lat stretch x 10, cueing to decrease arching of back;  shoulder protraction x 10, slow ;  TA contraction with education and practice for achieving active contraction ;  SLR 2x 10 bil with TA; supine march with TA x 15;   Seated:  Standing:  standing at wall, shoulder flexion x 10 ( no pain in t-spine)  Stretches:   LTR x 10; Neuromuscular Re-education: Manual Therapy: STM/DTM to R thoracic paraspinals  Therapeutic Activity: Self Care:   02/02/2023 Therapeutic Exercise: Aerobic: Supine:  shoulder flexion/cane for thoracic/ lat stretch x 10;  shoulder protraction x 10, slow ;  TA contraction with education and practice for achieving active contraction ;  SLR x 10 bil with TA;  Seated:  Standing: Stretches:   LTR x 10;  standing R QL stretch 20 sec x 3 on R; Cat/cow x 10, slow  Neuromuscular Re-education: Manual Therapy: Therapeutic Activity: Self Care:    PATIENT  EDUCATION:  Education details: updated and reviewed HEP Person educated: Patient Education method: Explanation, Demonstration, Tactile cues, Verbal cues, and  Handouts Education comprehension: verbalized understanding, returned demonstration, verbal cues required, tactile cues required, and needs further education   HOME EXERCISE PROGRAM: Access Code: [email or web address]   ASSESSMENT:  CLINICAL IMPRESSION: 02/08/2023 Thoracic musculature still very tight, but much less tender and sore from previous session, with improved tolerance for manual. TA contraction still challenging, but improved ability to perform, and no cramping/pain when working on this today. Plan to continue core strength as able.   Eval: Patient presents with primary complaint of  pain in thoracic region. She has increased muscle tension on R, and some pain at central t-spine. She has minimal pain to palpate today, but notes constant soreness that has been ongoing.  Pt with decreased ability for full functional activities. Pt will  benefit from skilled PT to improve deficits and pain and to return to PLOF.   OBJECTIVE IMPAIRMENTS: decreased activity tolerance, decreased mobility, decreased ROM, decreased strength, increased muscle spasms, impaired flexibility, improper body mechanics, and pain.   ACTIVITY LIMITATIONS: carrying, lifting, bending, sitting, standing, squatting, hygiene/grooming, and locomotion level  PARTICIPATION LIMITATIONS: meal prep, cleaning, laundry, driving, shopping, community activity, occupation, and yard work  PERSONAL FACTORS:  none  are also affecting patient's functional outcome.   REHAB POTENTIAL: Good  CLINICAL DECISION MAKING: Stable/uncomplicated  EVALUATION COMPLEXITY: Low   GOALS: Goals reviewed with patient? Yes  SHORT TERM GOALS: Target date: 02/01/23  Pt to be independent with initial HEP  Goal status: INITIAL   LONG TERM GOALS: Target date: 114/24  Pt to be independent with final  HEP  Goal status: INITIAL  2.  Pt to report decreased pain in thoracic and lumbar spine to be 0-2/10  Goal status: INITIAL  3.  Pt to demo improved strength of hips and core to be [facility] to improve pain and mobility.   Goal status: INITIAL  4.  Pt to demo soft tissue limitations in thoracic musculature, to be [facility], to decrease pain.   Goal status: INITIAL    PLAN:  PT FREQUENCY: 1-2x/week  PT DURATION: 8 weeks  PLANNED INTERVENTIONS: Therapeutic exercises, Therapeutic activity, Neuromuscular re-education, Patient/Family education, Self Care, Joint mobilization, Joint manipulation, Stair training, Orthotic/Fit training, DME instructions, Aquatic Therapy, Dry Needling, Electrical stimulation, Cryotherapy, Moist heat, Taping, Ultrasound, Ionotophoresis 4mg /ml Dexamethasone, Manual therapy,  Vasopneumatic device, Traction, Spinal manipulation, Spinal mobilization,Balance training, Gait training,   PLAN FOR NEXT SESSION: manual as needed/tolerated for R thoracic paraspinals, focus on core activation and control.    Sedalia Muta, PT, DPT 1:17 PM  02/08/23

## 2023-02-11 ENCOUNTER — Other Ambulatory Visit: Payer: Self-pay | Admitting: Family Medicine

## 2023-02-15 ENCOUNTER — Ambulatory Visit: Payer: BC Managed Care – PPO | Admitting: Physical Therapy

## 2023-02-15 DIAGNOSIS — M546 Pain in thoracic spine: Secondary | ICD-10-CM | POA: Diagnosis not present

## 2023-02-15 NOTE — Therapy (Unsigned)
OUTPATIENT PHYSICAL THERAPY LOWER EXTREMITY TREATMENT   Patient Name: Brooke Khan MRN: [id number] DOB:December 19, 1965, 57 y.o., female Today's Date: 02/08/2023  END OF SESSION:     Past Medical History:  Diagnosis Date   Bradycardia    Chicken pox    Elevated blood pressure reading    Epigastric abdominal pain    Family history of breast cancer 12/26/2020   Family history of prostate cancer 12/26/2020   Heart murmur    since childhood- not always heard   Hyperglycemia    Hyperlipidemia    borderline- no meds   Hypothyroidism (acquired)    Lightheaded    ovarian ca 11/2020   Pre-diabetes    Vitamin D deficiency    Past Surgical History:  Procedure Laterality Date   CESAREAN SECTION     x2   cosmetic knee surgery     as child   LAPAROSCOPIC APPENDECTOMY N/A 10/25/2022   Procedure: APPENDECTOMY LAPAROSCOPIC;  Surgeon: Diamantina Monks, MD;  Location: [facility];  Service: General;  Laterality: N/A;   LAPAROTOMY N/A 12/03/2020   Procedure: MINI LAPAROTOMY FOR CYST DECOMPRESSION;  Surgeon: Adolphus Birchwood, MD;  Location: [facility];  Service: Gynecology;  Laterality: N/A;   ROBOTIC ASSISTED TOTAL HYSTERECTOMY WITH BILATERAL SALPINGO OOPHERECTOMY N/A 12/03/2020   Procedure: XI ROBOTIC ASSISTED TOTAL HYSTERECTOMY WITH BILATERAL SALPINGO OOPHORECTOMY WITH PARA-AORTIC AND PELVIC LYMPHADNECTOMY AND OMENTECTOMY;  Surgeon: Adolphus Birchwood, MD;  Location: [facility];  Service: Gynecology;  Laterality: N/A;   Patient Active Problem List   Diagnosis Date Noted   Aortic atherosclerosis (HCC) 12/29/2022   Obesity, Class I, BMI 30-34.9 06/23/2022   Gallbladder mass 06/23/2022   Acute back pain 06/19/2021   Mild anxiety 04/07/2021   Genetic testing 01/21/2021   Family history of breast cancer 12/26/2020   Family history of prostate cancer 12/26/2020   Other constipation 12/15/2020   Elevated CA-125 12/03/2020   Ovarian cancer, right (HCC)    Elevated blood pressure reading 02/01/2019    Hypothyroidism, unspecified 10/04/2018   Vitamin D deficiency 10/04/2018    PCP: Tana Conch  REFERRING PROVIDER: Tana Conch  REFERRING DIAG: R thoracic pain  THERAPY DIAG:  No diagnosis found.  Rationale for Evaluation and Treatment: Rehabilitation  ONSET DATE:  May 2024.   SUBJECTIVE:   SUBJECTIVE STATEMENT: Pt states minimal pain, but still feels it/sore most days.    Eval:  Pt states R low  pain, had CT in may- started after that. June- she had appendicitis,  feels some of that pain never went away.  Now: pain with walking:  also has some anterior hip pain- but has been better more recently.  Main complaint: Mid R thoracic pain, into R center t spine.   More rare- in low back or QL.  Aches pretty constantly, sitting may be worse Heating pad, better, and laying down, She is sleeping well, minimal pain.  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   States when she has Severe pain- feels lightheaded- has happened 5-6 times.   PERTINENT HISTORY: Ovarian CA, hysterectomy, appendectomy,   PAIN:  Are you having pain? Yes: NPRS scale: 3-6 /10 Pain location: R thoracic/lumbar region Pain description: sore, constant  Aggravating factors: activity, sitting, constant  Relieving factors: none stated    PRECAUTIONS: None  WEIGHT BEARING RESTRICTIONS: No  FALLS:  Has patient fallen in last 6 months? No   PLOF: Independent  PATIENT GOALS:  Decreased pain   NEXT MD VISIT:  OBJECTIVE:   DIAGNOSTIC FINDINGS:   PATIENT SURVEYS:  FOTO:   COGNITION: Overall cognitive status: Within functional limits for tasks assessed     SENSATION: WFL  EDEMA:   POSTURE:  Posteriorly:  R shoulder slightly lower, hips and ASIS appear even.   PALPATION:  Increased tightness in R mid and low thoracic paraspinals, no pain to palpate today No pain to palpate or mobilize ribs, no pain with PA s  to thoracic spine.  States general/constant soreness in  this region.   LOWER EXTREMITY ROM:  Lumbar: WFL Hips: WFL Knees: WFL   LOWER EXTREMITY MMT:  MMT Left eval Right  eval  Hip flexion 4 4  Hip extension    Hip abduction 4 4  Hip adduction    Hip internal rotation    Hip external rotation    Knee flexion    Knee extension    Ankle dorsiflexion    Ankle plantarflexion    Ankle inversion    Ankle eversion     (Blank rows = not tested)  LOWER EXTREMITY SPECIAL TESTS:     TODAY'S TREATMENT:                                                                                                                              DATE:   02/15/23: Therapeutic Exercise: Aerobic: Supine:  shoulder flexion for thoracic/ lat stretch x 10, cueing to decrease arching of back;  shoulder protraction x 10, slow ;  Plank on knees 20 sec x 4;   SLR 2x 10 bil with TA;   supine march with TA x 15;   Bridging x 15;   Seated:  Standing:   Rows x 15;  Stretches:   LTR x 10; SKTC 30 sec x 2 bil;   Neuromuscular Re-education: Manual Therapy: STM/DTM to R thoracic paraspinals  Therapeutic Activity: Self Care:   02/08/23: Therapeutic Exercise: Aerobic: Supine:  shoulder flexion for thoracic/ lat stretch x 10, cueing to decrease arching of back;  shoulder protraction x 10, slow ;  TA contraction with education and practice for achieving active contraction ;  SLR 2x 10 bil with TA; supine march with TA x 15;   Seated:  Standing:  standing at wall, shoulder flexion x 10 ( no pain in t-spine)  Stretches:   LTR x 10; Neuromuscular Re-education: Manual Therapy: STM/DTM to R thoracic paraspinals  Therapeutic Activity: Self Care:   02/02/2023 Therapeutic Exercise: Aerobic: Supine:  shoulder flexion/cane for thoracic/ lat stretch x 10;  shoulder protraction x 10, slow ;  TA contraction with education and practice for achieving active contraction ;  SLR x 10 bil with TA;  Seated:  Standing: Stretches:   LTR x 10;  standing R QL stretch 20 sec x 3  on R; Cat/cow x 10, slow  Neuromuscular Re-education: Manual Therapy: Therapeutic Activity: Self Care:    PATIENT EDUCATION:  Education details: updated and reviewed HEP Person educated: Patient  Education method: Explanation, Demonstration, Tactile cues, Verbal cues, and Handouts Education comprehension: verbalized understanding, returned demonstration, verbal cues required, tactile cues required, and needs further education   HOME EXERCISE PROGRAM: Access Code: [email or web address]   ASSESSMENT:  CLINICAL IMPRESSION: 02/15/2023 Thoracic musculature still very tight, but much less tender and sore from previous session, with improved tolerance for manual. TA contraction still challenging, but improved ability to perform, and no cramping/pain when working on this today. Plan to continue core strength as able.   Eval: Patient presents with primary complaint of  pain in thoracic region. She has increased muscle tension on R, and some pain at central t-spine. She has minimal pain to palpate today, but notes constant soreness that has been ongoing.  Pt with decreased ability for full functional activities. Pt will  benefit from skilled PT to improve deficits and pain and to return to PLOF.   OBJECTIVE IMPAIRMENTS: decreased activity tolerance, decreased mobility, decreased ROM, decreased strength, increased muscle spasms, impaired flexibility, improper body mechanics, and pain.   ACTIVITY LIMITATIONS: carrying, lifting, bending, sitting, standing, squatting, hygiene/grooming, and locomotion level  PARTICIPATION LIMITATIONS: meal prep, cleaning, laundry, driving, shopping, community activity, occupation, and yard work  PERSONAL FACTORS:  none  are also affecting patient's functional outcome.   REHAB POTENTIAL: Good  CLINICAL DECISION MAKING: Stable/uncomplicated  EVALUATION COMPLEXITY: Low   GOALS: Goals reviewed with patient? Yes  SHORT TERM GOALS: Target date: 02/01/23  Pt to be  independent with initial HEP  Goal status: INITIAL   LONG TERM GOALS: Target date: 114/24  Pt to be independent with final HEP  Goal status: INITIAL  2.  Pt to report decreased pain in thoracic and lumbar spine to be 0-2/10  Goal status: INITIAL  3.  Pt to demo improved strength of hips and core to be [facility] to improve pain and mobility.   Goal status: INITIAL  4.  Pt to demo soft tissue limitations in thoracic musculature, to be [facility], to decrease pain.   Goal status: INITIAL    PLAN:  PT FREQUENCY: 1-2x/week  PT DURATION: 8 weeks  PLANNED INTERVENTIONS: Therapeutic exercises, Therapeutic activity, Neuromuscular re-education, Patient/Family education, Self Care, Joint mobilization, Joint manipulation, Stair training, Orthotic/Fit training, DME instructions, Aquatic Therapy, Dry Needling, Electrical stimulation, Cryotherapy, Moist heat, Taping, Ultrasound, Ionotophoresis 4mg /ml Dexamethasone, Manual therapy,  Vasopneumatic device, Traction, Spinal manipulation, Spinal mobilization,Balance training, Gait training,   PLAN FOR NEXT SESSION: manual as needed/tolerated for R thoracic paraspinals, focus on core activation and control.    Sedalia Muta, PT, DPT 11:06 AM  02/15/23

## 2023-02-16 ENCOUNTER — Encounter: Payer: Self-pay | Admitting: Physical Therapy

## 2023-02-18 ENCOUNTER — Ambulatory Visit: Payer: BC Managed Care – PPO | Admitting: Family

## 2023-02-18 VITALS — BP 127/76 | HR 74 | Temp 98.0°F | Ht 62.5 in | Wt 185.0 lb

## 2023-02-18 DIAGNOSIS — R300 Dysuria: Secondary | ICD-10-CM

## 2023-02-18 LAB — POCT URINALYSIS DIPSTICK
Bilirubin, UA: NEGATIVE
Blood, UA: POSITIVE — AB
Glucose, UA: NEGATIVE
Ketones, UA: NEGATIVE
Leukocytes, UA: NEGATIVE
Nitrite, UA: NEGATIVE
Protein, UA: NEGATIVE
Spec Grav, UA: 1.015 (ref 1.010–1.025)
Urobilinogen, UA: 0.2 U/dL
pH, UA: 7 (ref 5.0–8.0)

## 2023-02-18 MED ORDER — PHENAZOPYRIDINE HCL 100 MG PO TABS
100.0000 mg | ORAL_TABLET | Freq: Three times a day (TID) | ORAL | 0 refills | Status: DC | PRN
Start: 2023-02-18 — End: 2023-02-23

## 2023-02-18 NOTE — Progress Notes (Signed)
Patient ID: Brooke Khan, female    DOB: [birth year], 57 y.o.   MRN: [id number]  Chief Complaint  Patient presents with   Urinary Frequency    Pt c/o urinary frequency and dysuria, Present for 2 days.    *Discussed the use of AI scribe software for clinical note transcription with the patient, who gave verbal consent to proceed.  History of Present Illness   The patient, with a history of ovarian cancer, presents with symptoms suggestive of a urinary tract infection (UTI). She reports a burning sensation at the end of urination, but denies any pain during urination or irritation when wiping. She also denies any redness, inflammation, or vaginal discharge. She has noticed an increased frequency of urination. She has not taken any over-the-counter medications such as AZO, but has been consuming liquid IVs for the past week. She denies any foul odor, pelvic pressure, or nausea. She reports a slight back pain, which she has been managing with PT, and a mild pelvic pressure. She has not noticed any blood in her urine or when wiping. Her last UTI was approximately two years ago.        Assessment & Plan:     Urinary Symptoms - Reports of increased frequency and burning sensation at the end of urination. No hematuria, or vaginal discharge. Urinalysis shows no signs of infection but microscopic hematuria present. -Encouraged increased water intake. -Prescribe Pyridium 100mg  tid prn for burning sensation, to be used for a couple of days. -Advise patient to report any changes in symptoms or if symptoms persist.      Subjective:    Outpatient Medications Prior to Visit  Medication Sig Dispense Refill   acetaminophen (TYLENOL) 500 MG tablet Take 2 tablets (1,000 mg total) by mouth 4 (four) times daily. 120 tablet 3   amLODipine (NORVASC) 10 MG tablet Take 1 tablet (10 mg total) by mouth daily. (Patient taking differently: Take 5 mg by mouth daily.) 90 tablet 3   calcium carbonate (TUMS -  DOSED IN MG ELEMENTAL CALCIUM) 500 MG chewable tablet Chew 1 tablet by mouth daily.     Cholecalciferol (VITAMIN D3) 1.25 MG (50000 UT) CAPS Vitamin D3  5,000 iu qd     fluorometholone (FML) 0.1 % ophthalmic suspension 1 drop 4 (four) times daily.     ibuprofen (ADVIL) 600 MG tablet Take 1 tablet (600 mg total) by mouth 4 (four) times daily. 120 tablet 1   levothyroxine (SYNTHROID) 50 MCG tablet TAKE 1 TABLET BY MOUTH EVERY DAY 90 tablet 3   rosuvastatin (CRESTOR) 5 MG tablet Take 1 tablet (5 mg total) by mouth daily. 90 tablet 3   valsartan (DIOVAN) 160 MG tablet Take 1 tablet (160 mg total) by mouth daily. 90 tablet 3   vitamin B-12 (CYANOCOBALAMIN) 1000 MCG tablet Take 1,000 mcg by mouth in the morning.     vitamin C (ASCORBIC ACID) 500 MG tablet Take 500 mg by mouth in the morning.     LORazepam (ATIVAN) 0.5 MG tablet Take 1 tablet (0.5 mg total) by mouth 2 (two) times daily as needed for anxiety (30 minutes before imaging.  Do not drive for 8 hours after use). (Patient not taking: Reported on 02/18/2023) 2 tablet 0   linaclotide (LINZESS) 145 MCG CAPS capsule Take 1 capsule (145 mcg total) by mouth daily before breakfast. (Patient taking differently: Take 145 mcg by mouth as needed.) 90 capsule 1   No facility-administered medications prior to visit.   Past Medical  History:  Diagnosis Date   Bradycardia    Chicken pox    Elevated blood pressure reading    Epigastric abdominal pain    Family history of breast cancer 12/26/2020   Family history of prostate cancer 12/26/2020   Heart murmur    since childhood- not always heard   Hyperglycemia    Hyperlipidemia    borderline- no meds   Hypothyroidism (acquired)    Lightheaded    ovarian ca 11/2020   Pre-diabetes    Vitamin D deficiency    Past Surgical History:  Procedure Laterality Date   CESAREAN SECTION     x2   cosmetic knee surgery     as child   LAPAROSCOPIC APPENDECTOMY N/A 10/25/2022   Procedure: APPENDECTOMY  LAPAROSCOPIC;  Surgeon: Diamantina Monks, MD;  Location: [facility];  Service: General;  Laterality: N/A;   LAPAROTOMY N/A 12/03/2020   Procedure: MINI LAPAROTOMY FOR CYST DECOMPRESSION;  Surgeon: Adolphus Birchwood, MD;  Location: [facility];  Service: Gynecology;  Laterality: N/A;   ROBOTIC ASSISTED TOTAL HYSTERECTOMY WITH BILATERAL SALPINGO OOPHERECTOMY N/A 12/03/2020   Procedure: XI ROBOTIC ASSISTED TOTAL HYSTERECTOMY WITH BILATERAL SALPINGO OOPHORECTOMY WITH PARA-AORTIC AND PELVIC LYMPHADNECTOMY AND OMENTECTOMY;  Surgeon: Adolphus Birchwood, MD;  Location: [facility];  Service: Gynecology;  Laterality: N/A;   Allergies  Allergen Reactions   Codeine Nausea Only and Other (See Comments)    "Feel bad"      Objective:    Physical Exam Vitals and nursing note reviewed.  Constitutional:      Appearance: Normal appearance.  Cardiovascular:     Rate and Rhythm: Normal rate and regular rhythm.  Pulmonary:     Effort: Pulmonary effort is normal.     Breath sounds: Normal breath sounds.  Musculoskeletal:        General: Normal range of motion.  Skin:    General: Skin is warm and dry.  Neurological:     Mental Status: She is alert.  Psychiatric:        Mood and Affect: Mood normal.        Behavior: Behavior normal.    BP 127/76 (BP Location: Left Arm, Patient Position: Sitting, Cuff Size: Large)   Pulse 74   Temp 98 F (36.7 C) (Temporal)   Ht 5' 2.5" (1.588 m)   Wt 185 lb (83.9 kg)   LMP 05/11/2014   SpO2 98%   BMI 33.30 kg/m  Wt Readings from Last 3 Encounters:  02/18/23 185 lb (83.9 kg)  12/31/22 178 lb (80.7 kg)  12/29/22 181 lb 12.8 oz (82.5 kg)      Dulce Sellar, NP

## 2023-02-22 ENCOUNTER — Encounter: Payer: BC Managed Care – PPO | Admitting: Physical Therapy

## 2023-02-22 IMAGING — US US ABDOMEN LIMITED RUQ/ASCITES
1 series · 14 of 25 positions shown · non-contrast
Comparison: November 08, 2015

CLINICAL DATA: Epigastric pain

EXAM:
ULTRASOUND ABDOMEN LIMITED RIGHT UPPER QUADRANT

[Series 1: us abdomen limited ruq/ascites · 0.22mm/px · 14 of 42 slices shown]
[im 1/42]
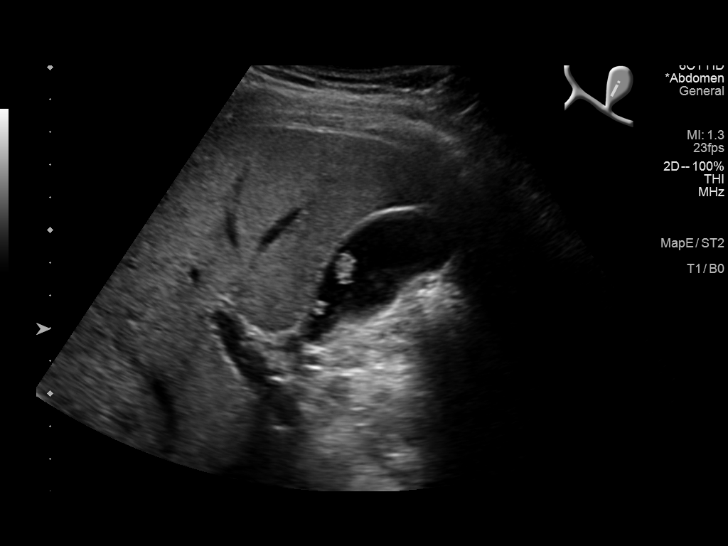
[im 4/42]
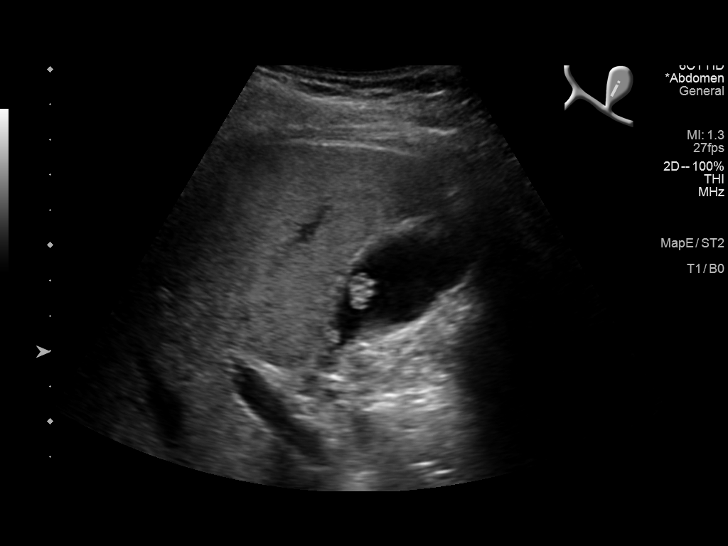
[im 7/42]
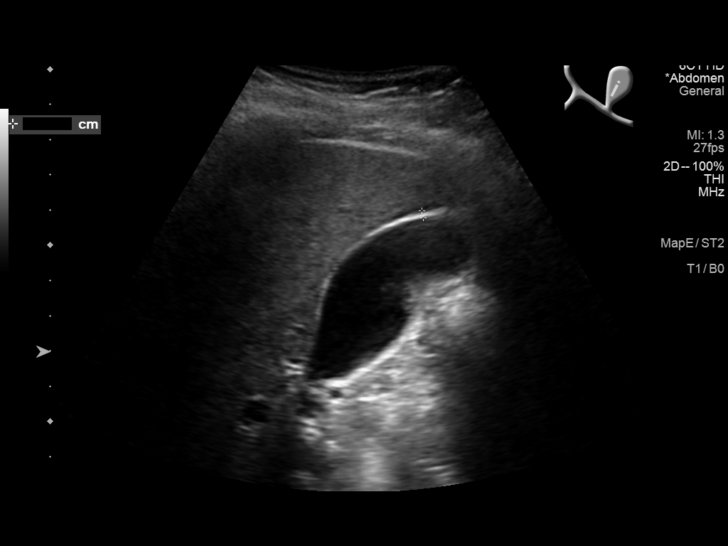
[im 11/42]
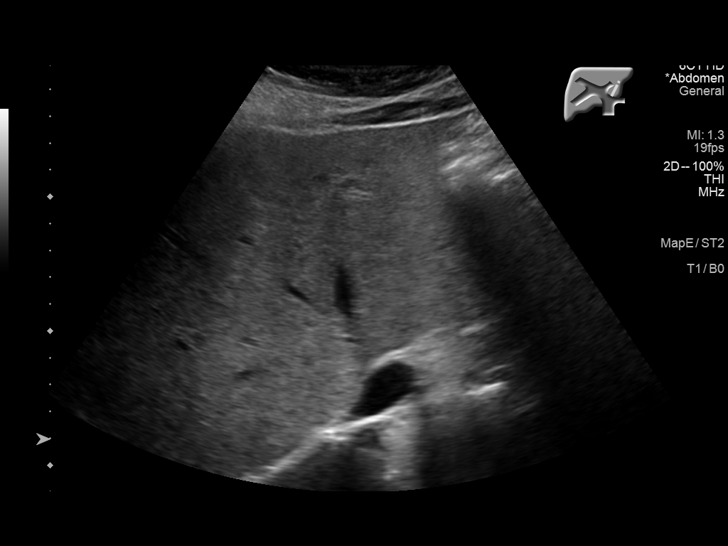
[im 14/42]
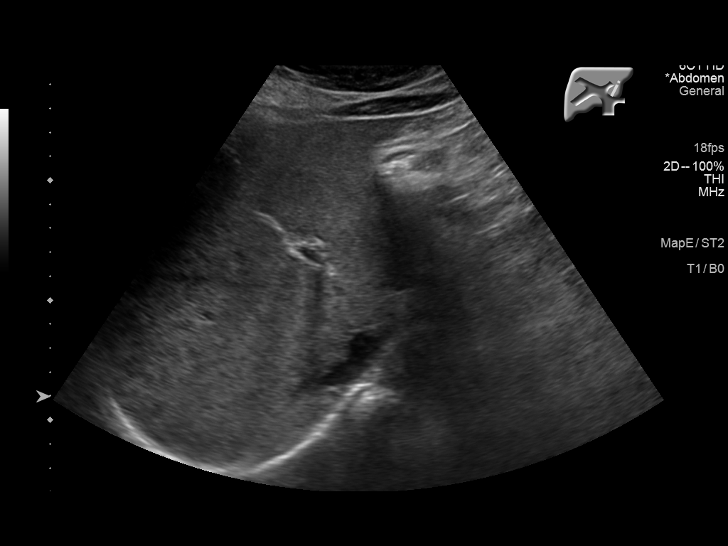
[im 16/42]
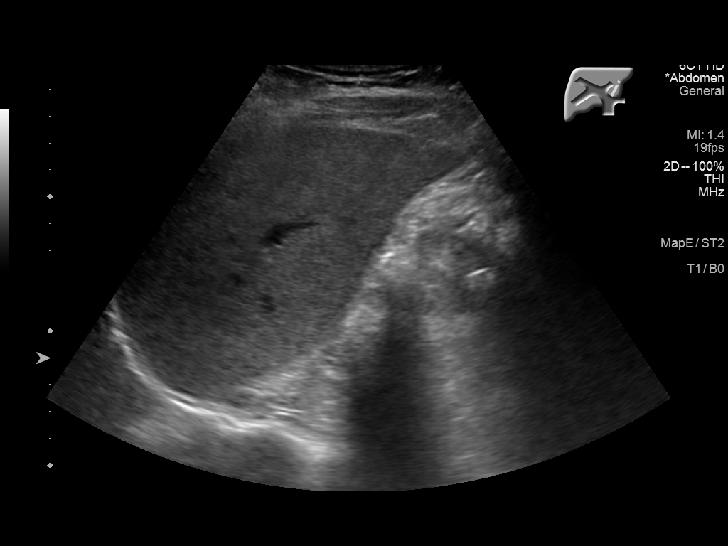
[im 19/42]
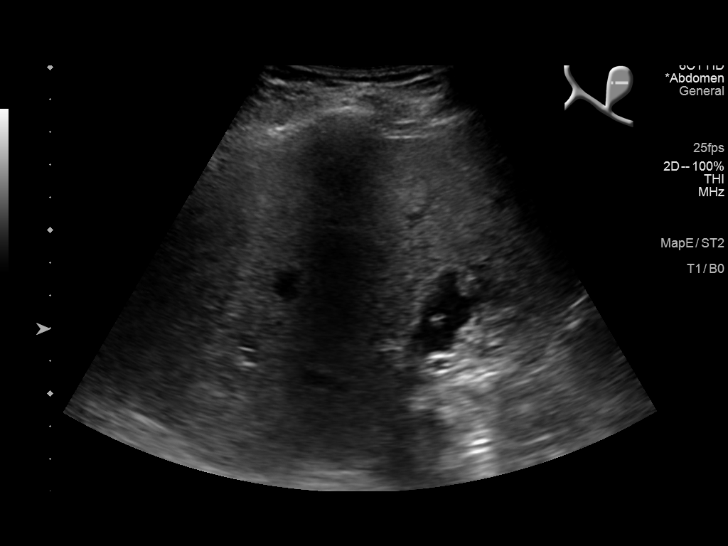
[im 23/42]
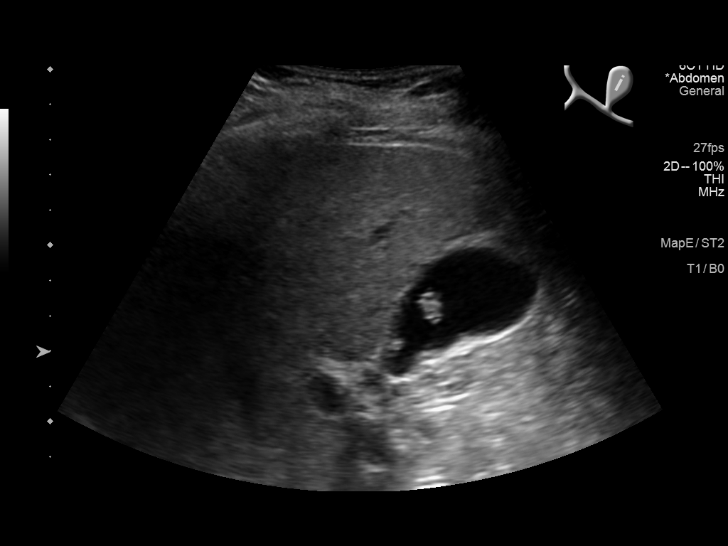
[im 26/42]
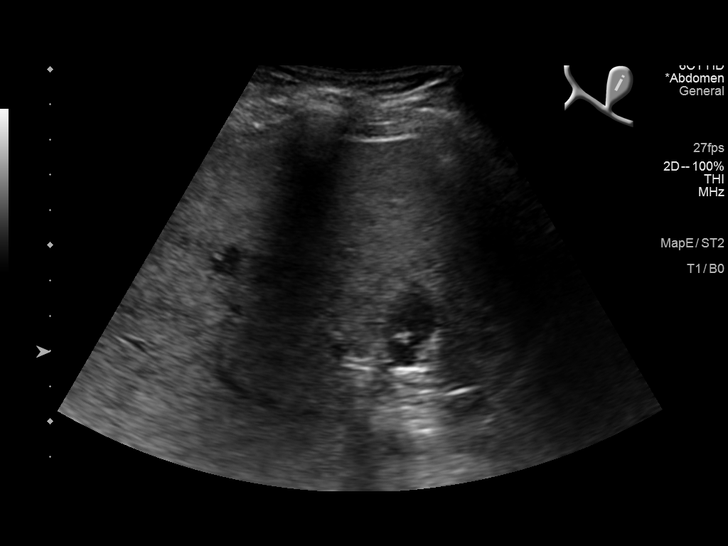
[im 28/42]
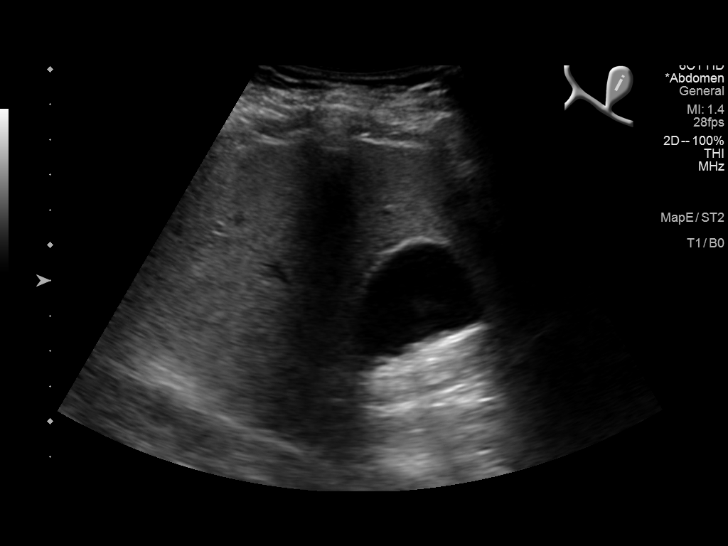
[im 31/42]
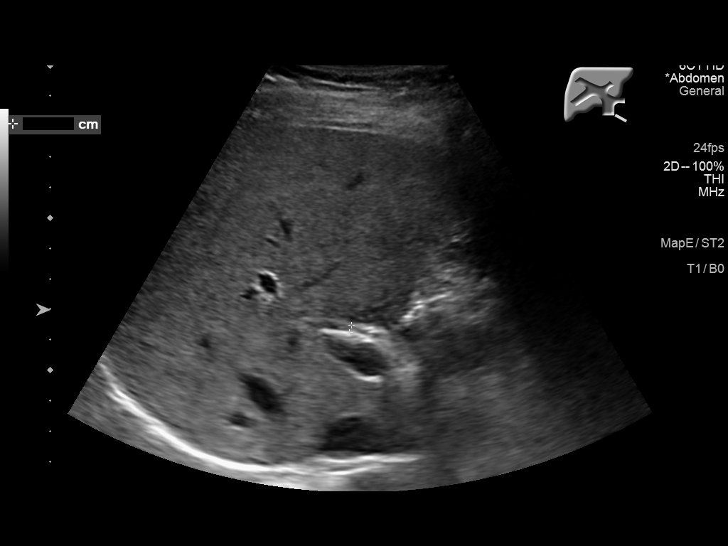
[im 35/42]
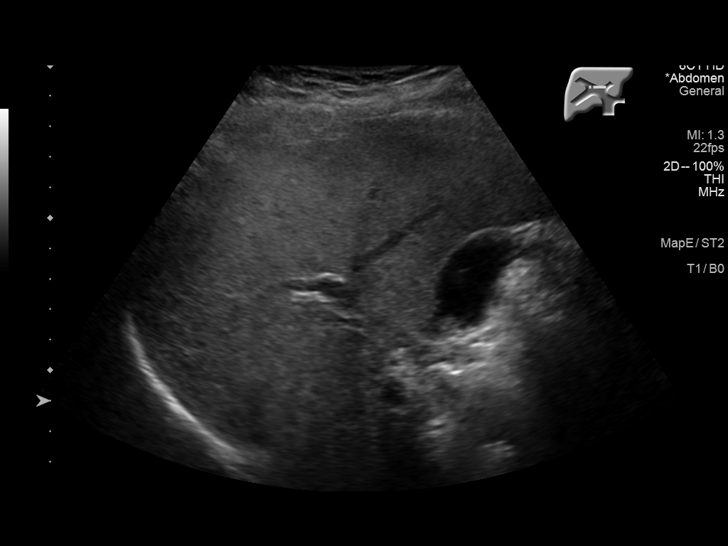
[im 38/42]
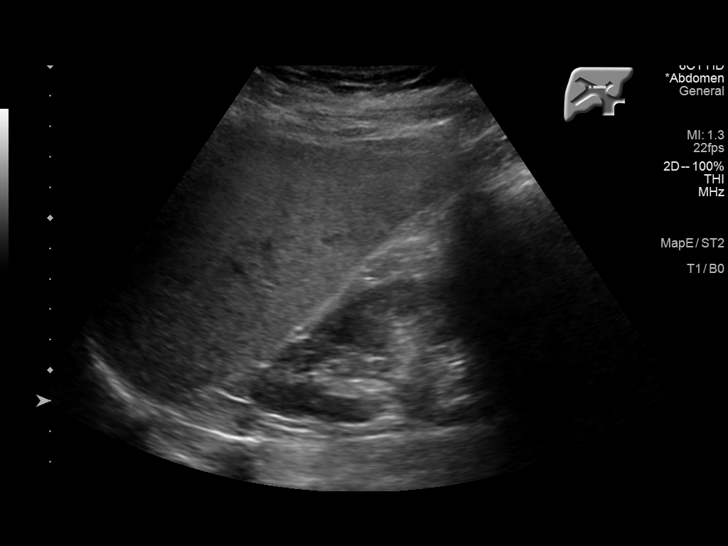
[im 42/42]
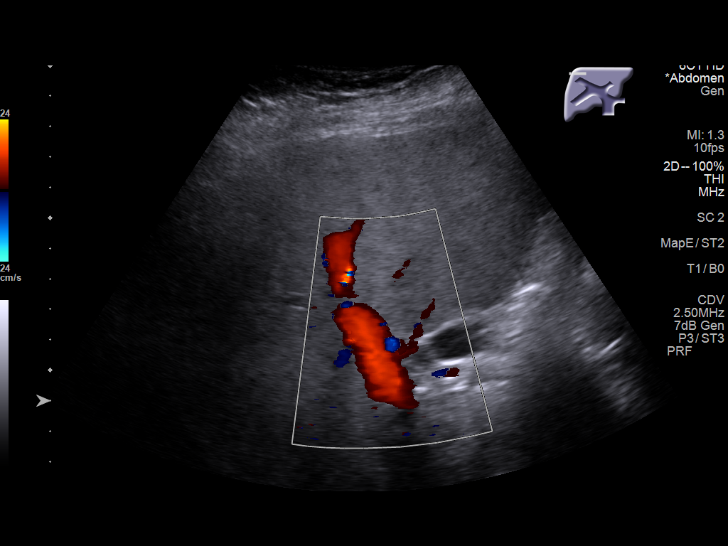

[14 of 25 positions shown; findings below may reference images not displayed]

FINDINGS: Gallbladder:

Within the gallbladder, there is a 9 mm echogenic focus along the
wall which neither moves nor shadows, a presumed polyp. A second 5
mm presumed polyp is noted within the gallbladder. There are no
echogenic foci in the gallbladder which move and shadow as is
expected with cholelithiasis. No gallbladder wall thickening or
pericholecystic fluid. No sonographic Murphy sign noted by
sonographer.

Common bile duct:

Diameter: 2 mm. No intrahepatic or extrahepatic biliary duct
dilatation.

Liver:

No focal lesion identified. Within normal limits in parenchymal
echogenicity. Portal vein is patent on color Doppler imaging with
normal direction of blood flow towards the liver.

Other: None.
IMPRESSION: Gallbladder polyps, largest measuring 9 mm in length. Per consensus
guidelines, a follow-up ultrasound of the gallbladder in 1 year is
advised to assess for stability. No evident gallstones, gallbladder
wall thickening, or pericholecystic fluid.

Study otherwise unremarkable.

## 2023-02-23 ENCOUNTER — Encounter: Payer: BC Managed Care – PPO | Admitting: Physical Therapy

## 2023-02-23 ENCOUNTER — Encounter: Payer: Self-pay | Admitting: Gynecologic Oncology

## 2023-02-25 ENCOUNTER — Encounter: Payer: BC Managed Care – PPO | Admitting: Physical Therapy

## 2023-02-25 ENCOUNTER — Encounter: Payer: Self-pay | Admitting: Physical Therapy

## 2023-02-25 ENCOUNTER — Other Ambulatory Visit: Payer: Self-pay | Admitting: Gynecologic Oncology

## 2023-02-25 ENCOUNTER — Encounter: Payer: Self-pay | Admitting: Gynecologic Oncology

## 2023-02-25 ENCOUNTER — Inpatient Hospital Stay: Payer: BC Managed Care – PPO | Attending: Gynecologic Oncology

## 2023-02-25 ENCOUNTER — Telehealth: Payer: Self-pay

## 2023-02-25 ENCOUNTER — Ambulatory Visit: Payer: BC Managed Care – PPO | Admitting: Physical Therapy

## 2023-02-25 DIAGNOSIS — R300 Dysuria: Secondary | ICD-10-CM | POA: Diagnosis not present

## 2023-02-25 DIAGNOSIS — Z9221 Personal history of antineoplastic chemotherapy: Secondary | ICD-10-CM | POA: Diagnosis not present

## 2023-02-25 DIAGNOSIS — R918 Other nonspecific abnormal finding of lung field: Secondary | ICD-10-CM | POA: Insufficient documentation

## 2023-02-25 DIAGNOSIS — R399 Unspecified symptoms and signs involving the genitourinary system: Secondary | ICD-10-CM

## 2023-02-25 DIAGNOSIS — Z8543 Personal history of malignant neoplasm of ovary: Secondary | ICD-10-CM | POA: Diagnosis not present

## 2023-02-25 DIAGNOSIS — M546 Pain in thoracic spine: Secondary | ICD-10-CM

## 2023-02-25 DIAGNOSIS — C561 Malignant neoplasm of right ovary: Secondary | ICD-10-CM

## 2023-02-25 LAB — URINALYSIS, COMPLETE (UACMP) WITH MICROSCOPIC
Bilirubin Urine: NEGATIVE
Glucose, UA: NEGATIVE mg/dL
Ketones, ur: NEGATIVE mg/dL
Nitrite: NEGATIVE
Protein, ur: 30 mg/dL — AB
Specific Gravity, Urine: 1.01 (ref 1.005–1.030)
WBC, UA: >50 WBC/hpf (ref 0–5)
pH: 6 (ref 5.0–8.0)

## 2023-02-25 LAB — CBC WITH DIFFERENTIAL/PLATELET
Abs Immature Granulocytes: 0.03 10*3/uL (ref 0.00–0.07)
Basophils Absolute: 0 10*3/uL (ref 0.0–0.1)
Basophils Relative: 0 %
Eosinophils Absolute: 0.1 10*3/uL (ref 0.0–0.5)
Eosinophils Relative: 1 %
HCT: 42.7 % (ref 36.0–46.0)
Hemoglobin: 13.8 g/dL (ref 12.0–15.0)
Immature Granulocytes: 0 %
Lymphocytes Relative: 13 %
Lymphs Abs: 1.3 10*3/uL (ref 0.7–4.0)
MCH: 30.7 pg (ref 26.0–34.0)
MCHC: 32.3 g/dL (ref 30.0–36.0)
MCV: 94.9 fL (ref 80.0–100.0)
Monocytes Absolute: 0.7 10*3/uL (ref 0.1–1.0)
Monocytes Relative: 7 %
Neutro Abs: 7.5 10*3/uL (ref 1.7–7.7)
Neutrophils Relative %: 79 %
Platelets: 300 10*3/uL (ref 150–400)
RBC: 4.5 MIL/uL (ref 3.87–5.11)
RDW: 13 % (ref 11.5–15.5)
WBC: 9.6 10*3/uL (ref 4.0–10.5)
nRBC: 0 % (ref 0.0–0.2)

## 2023-02-25 LAB — COMPREHENSIVE METABOLIC PANEL
ALT: 18 U/L (ref 0–44)
AST: 13 U/L — ABNORMAL LOW (ref 15–41)
Albumin: 4.4 g/dL (ref 3.5–5.0)
Alkaline Phosphatase: 87 U/L (ref 38–126)
Anion gap: 6 (ref 5–15)
BUN: 16 mg/dL (ref 6–20)
CO2: 29 mmol/L (ref 22–32)
Calcium: 9.8 mg/dL (ref 8.9–10.3)
Chloride: 107 mmol/L (ref 98–111)
Creatinine, Ser: 0.83 mg/dL (ref 0.44–1.00)
GFR, Estimated: >60 mL/min (ref >60)
Glucose, Bld: 120 mg/dL — ABNORMAL HIGH (ref 70–99)
Potassium: 4.4 mmol/L (ref 3.5–5.1)
Sodium: 142 mmol/L (ref 135–145)
Total Bilirubin: 0.5 mg/dL (ref 0.3–1.2)
Total Protein: 7.8 g/dL (ref 6.5–8.1)

## 2023-02-25 MED ORDER — NITROFURANTOIN MONOHYD MACRO 100 MG PO CAPS
100.0000 mg | ORAL_CAPSULE | Freq: Two times a day (BID) | ORAL | 0 refills | Status: AC
Start: 2023-02-25 — End: 2023-03-02

## 2023-02-25 NOTE — Progress Notes (Signed)
Please let her know UA is concerning for possible infection. I'm going to send an antibiotic to her pharmacy and will let her know when the urine culture comes back if we need to change anything. Thank you

## 2023-02-25 NOTE — Therapy (Signed)
OUTPATIENT PHYSICAL THERAPY LOWER EXTREMITY TREATMENT   Patient Name: Brooke Khan MRN: [id number] DOB:November 30, 1965, 57 y.o., female Today's Date: 02/25/2023  END OF SESSION:  PT End of Session - 02/25/23 1408     Visit Number 5    Number of Visits 16    Date for PT Re-Evaluation 03/15/23    Authorization Type [employer]    PT Start Time 1415    PT Stop Time 1450    PT Time Calculation (min) 35 min    Activity Tolerance Patient tolerated treatment well    Behavior During Therapy WFL for tasks assessed/performed               Past Medical History:  Diagnosis Date   Bradycardia    Chicken pox    Elevated blood pressure reading    Epigastric abdominal pain    Family history of breast cancer 12/26/2020   Family history of prostate cancer 12/26/2020   Heart murmur    since childhood- not always heard   Hyperglycemia    Hyperlipidemia    borderline- no meds   Hypothyroidism (acquired)    Lightheaded    ovarian ca 11/2020   Pre-diabetes    Vitamin D deficiency    Past Surgical History:  Procedure Laterality Date   CESAREAN SECTION     x2   cosmetic knee surgery     as child   LAPAROSCOPIC APPENDECTOMY N/A 10/25/2022   Procedure: APPENDECTOMY LAPAROSCOPIC;  Surgeon: Diamantina Monks, MD;  Location: [facility];  Service: General;  Laterality: N/A;   LAPAROTOMY N/A 12/03/2020   Procedure: MINI LAPAROTOMY FOR CYST DECOMPRESSION;  Surgeon: Adolphus Birchwood, MD;  Location: [facility];  Service: Gynecology;  Laterality: N/A;   ROBOTIC ASSISTED TOTAL HYSTERECTOMY WITH BILATERAL SALPINGO OOPHERECTOMY N/A 12/03/2020   Procedure: XI ROBOTIC ASSISTED TOTAL HYSTERECTOMY WITH BILATERAL SALPINGO OOPHORECTOMY WITH PARA-AORTIC AND PELVIC LYMPHADNECTOMY AND OMENTECTOMY;  Surgeon: Adolphus Birchwood, MD;  Location: [facility];  Service: Gynecology;  Laterality: N/A;   Patient Active Problem List   Diagnosis Date Noted   Aortic atherosclerosis (HCC) 12/29/2022   Obesity, Class I, BMI 30-34.9 06/23/2022    Gallbladder mass 06/23/2022   Acute back pain 06/19/2021   Mild anxiety 04/07/2021   Genetic testing 01/21/2021   Family history of breast cancer 12/26/2020   Family history of prostate cancer 12/26/2020   Other constipation 12/15/2020   Elevated CA-125 12/03/2020   Ovarian cancer, right (HCC)    Elevated blood pressure reading 02/01/2019   Hypothyroidism, unspecified 10/04/2018   Vitamin D deficiency 10/04/2018    PCP: Tana Conch  REFERRING PROVIDER: Tana Conch  REFERRING DIAG: R thoracic pain  THERAPY DIAG:  Pain in thoracic spine  Rationale for Evaluation and Treatment: Rehabilitation  ONSET DATE:  May 2024.   SUBJECTIVE:   SUBJECTIVE STATEMENT: Pt states increased back pain. She was holding onto dog when on a boat, and thinks she aggravated it. She also has had UTI symptoms for several days and is very uncomfortable.   Eval:  Pt states R low  pain, had CT in may- started after that. June- she had appendicitis,  feels some of that pain never went away.  Now: pain with walking:  also has some anterior hip pain- but has been better more recently.  Main complaint: Mid R thoracic pain, into R center t spine.   More rare- in low back or QL.  Aches pretty constantly, sitting may be worse Heating pad, better, and laying down, She  is sleeping well, minimal pain.  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   States when she has Severe pain- feels lightheaded- has happened 5-6 times.   PERTINENT HISTORY: Ovarian CA, hysterectomy, appendectomy,   PAIN:  Are you having pain? Yes: NPRS scale: 3-6 /10 Pain location: R thoracic/lumbar region Pain description: sore, constant  Aggravating factors: activity, sitting, constant  Relieving factors: none stated    PRECAUTIONS: None  WEIGHT BEARING RESTRICTIONS: No  FALLS:  Has patient fallen in last 6 months? No   PLOF: Independent  PATIENT GOALS:  Decreased pain   NEXT MD VISIT:   OBJECTIVE:    DIAGNOSTIC FINDINGS:   PATIENT SURVEYS:  FOTO:   COGNITION: Overall cognitive status: Within functional limits for tasks assessed     SENSATION: WFL  EDEMA:   POSTURE:  Posteriorly:  R shoulder slightly lower, hips and ASIS appear even.   PALPATION:  Increased tightness in R mid and low thoracic paraspinals, no pain to palpate today No pain to palpate or mobilize ribs, no pain with PA s  to thoracic spine.  States general/constant soreness in this region.   LOWER EXTREMITY ROM:  Lumbar: WFL Hips: WFL Knees: WFL   LOWER EXTREMITY MMT:  MMT Left eval Right  eval  Hip flexion 4 4  Hip extension    Hip abduction 4 4  Hip adduction    Hip internal rotation    Hip external rotation    Knee flexion    Knee extension    Ankle dorsiflexion    Ankle plantarflexion    Ankle inversion    Ankle eversion     (Blank rows = not tested)  LOWER EXTREMITY SPECIAL TESTS:     TODAY'S TREATMENT:                                                                                                                              DATE:   02/25/2023   Therapeutic Exercise: Aerobic: Supine:  shoulder flexion for thoracic/ lat stretch x 10, cueing to decrease arching of back;   shoulder protraction x 10, slow;  SLR 2x 10 bil with TA;   supine march with TA x 15;   Bridging x 15;   Seated:  Standing:   Rows x 15;  Stretches:  LTR x 10;   SKTC 30 sec x 2 bil;   Cat/cow x 15; childs pose, center, L, R x 3 ea bil;  Neuromuscular Re-education: Manual Therapy:  Therapeutic Activity: Self Care: Modalities: moist heat pack with supine ther ex to thoraicc and lumbar spine     02/15/23: Therapeutic Exercise: Aerobic: Supine:  shoulder flexion for thoracic/ lat stretch x 10, cueing to decrease arching of back;  shoulder protraction x 10, slow ;  Plank on knees 20 sec x 4;   SLR 2x 10 bil with TA;   supine march with TA x 15;   Bridging x 15;  Seated:  Standing:   Rows x 15;   Stretches:   LTR x 10;  SKTC 30 sec x 2 bil;   Cat/cow x 15;  Neuromuscular Re-education: Manual Therapy:  Therapeutic Activity: Self Care:   02/08/23: Therapeutic Exercise: Aerobic: Supine:  shoulder flexion for thoracic/ lat stretch x 10, cueing to decrease arching of back;  shoulder protraction x 10, slow ;  TA contraction with education and practice for achieving active contraction ;  SLR 2x 10 bil with TA; supine march with TA x 15;   Seated:  Standing:  standing at wall, shoulder flexion x 10 ( no pain in t-spine)  Stretches:   LTR x 10; Neuromuscular Re-education: Manual Therapy: STM/DTM to R thoracic paraspinals  Therapeutic Activity: Self Care:   02/02/2023 Therapeutic Exercise: Aerobic: Supine:  shoulder flexion/cane for thoracic/ lat stretch x 10;  shoulder protraction x 10, slow ;  TA contraction with education and practice for achieving active contraction ;  SLR x 10 bil with TA;  Seated:  Standing: Stretches:   LTR x 10;  standing R QL stretch 20 sec x 3 on R; Cat/cow x 10, slow  Neuromuscular Re-education: Manual Therapy: Therapeutic Activity: Self Care:    PATIENT EDUCATION:  Education details: updated and reviewed HEP Person educated: Patient Education method: Explanation, Demonstration, Tactile cues, Verbal cues, and Handouts Education comprehension: verbalized understanding, returned demonstration, verbal cues required, tactile cues required, and needs further education   HOME EXERCISE PROGRAM: Access Code: [email or web address]   ASSESSMENT:  CLINICAL IMPRESSION: 02/25/2023 Decreased activities done today due to pt with pain in back and uncomfortable from UTI, as well as getting much cramping and spasm from UE movements today. She has spasms with attempts for core tightening and stability.  We were able to perform light ther ex with heat on low back. Plan to continue as tolerated when pt is feeling better next visit.   Eval: Patient presents with primary  complaint of  pain in thoracic region. She has increased muscle tension on R, and some pain at central t-spine. She has minimal pain to palpate today, but notes constant soreness that has been ongoing.  Pt with decreased ability for full functional activities. Pt will  benefit from skilled PT to improve deficits and pain and to return to PLOF.   OBJECTIVE IMPAIRMENTS: decreased activity tolerance, decreased mobility, decreased ROM, decreased strength, increased muscle spasms, impaired flexibility, improper body mechanics, and pain.   ACTIVITY LIMITATIONS: carrying, lifting, bending, sitting, standing, squatting, hygiene/grooming, and locomotion level  PARTICIPATION LIMITATIONS: meal prep, cleaning, laundry, driving, shopping, community activity, occupation, and yard work  PERSONAL FACTORS:  none  are also affecting patient's functional outcome.   REHAB POTENTIAL: Good  CLINICAL DECISION MAKING: Stable/uncomplicated  EVALUATION COMPLEXITY: Low   GOALS: Goals reviewed with patient? Yes  SHORT TERM GOALS: Target date: 02/01/23  Pt to be independent with initial HEP  Goal status: INITIAL   LONG TERM GOALS: Target date: 114/24  Pt to be independent with final HEP  Goal status: INITIAL  2.  Pt to report decreased pain in thoracic and lumbar spine to be 0-2/10  Goal status: INITIAL  3.  Pt to demo improved strength of hips and core to be [facility] to improve pain and mobility.   Goal status: INITIAL  4.  Pt to demo soft tissue limitations in thoracic musculature, to be [facility], to decrease pain.   Goal status: INITIAL    PLAN:  PT FREQUENCY: 1-2x/week  PT DURATION: 8  weeks  PLANNED INTERVENTIONS: Therapeutic exercises, Therapeutic activity, Neuromuscular re-education, Patient/Family education, Self Care, Joint mobilization, Joint manipulation, Stair training, Orthotic/Fit training, DME instructions, Aquatic Therapy, Dry Needling, Electrical stimulation, Cryotherapy, Moist heat,  Taping, Ultrasound, Ionotophoresis 4mg /ml Dexamethasone, Manual therapy,  Vasopneumatic device, Traction, Spinal manipulation, Spinal mobilization,Balance training, Gait training,   PLAN FOR NEXT SESSION: manual as needed/tolerated for R thoracic paraspinals, focus on core activation and control.    Sedalia Muta, PT, DPT 3:01 PM  02/25/23

## 2023-02-25 NOTE — Telephone Encounter (Signed)
Pt is aware of urinalysis results from sample this morning. She will pick abx up and wait for our call once the culture comes back. She was thankful for the quick response.

## 2023-02-25 NOTE — Telephone Encounter (Signed)
Please let the patient know I've added UA and culture so that she can have these done when she comes in at 9. Thank you

## 2023-02-25 NOTE — Telephone Encounter (Signed)
-----   Message from Carver Fila sent at 02/25/2023 11:11 AM EDT ----- Please let her know UA is concerning for possible infection. I'm going to send an antibiotic to her pharmacy and will let her know when the urine culture comes back if we need to change anything. Thank you

## 2023-02-26 ENCOUNTER — Encounter: Payer: Self-pay | Admitting: Gynecologic Oncology

## 2023-02-26 ENCOUNTER — Inpatient Hospital Stay (HOSPITAL_BASED_OUTPATIENT_CLINIC_OR_DEPARTMENT_OTHER): Payer: BC Managed Care – PPO | Admitting: Gynecologic Oncology

## 2023-02-26 VITALS — BP 138/64 | HR 66 | Temp 98.7°F | Resp 20 | Wt 180.6 lb

## 2023-02-26 DIAGNOSIS — Z8543 Personal history of malignant neoplasm of ovary: Secondary | ICD-10-CM

## 2023-02-26 DIAGNOSIS — C561 Malignant neoplasm of right ovary: Secondary | ICD-10-CM

## 2023-02-26 DIAGNOSIS — Z9221 Personal history of antineoplastic chemotherapy: Secondary | ICD-10-CM | POA: Diagnosis not present

## 2023-02-26 DIAGNOSIS — R300 Dysuria: Secondary | ICD-10-CM

## 2023-02-26 DIAGNOSIS — R399 Unspecified symptoms and signs involving the genitourinary system: Secondary | ICD-10-CM

## 2023-02-26 DIAGNOSIS — R918 Other nonspecific abnormal finding of lung field: Secondary | ICD-10-CM

## 2023-02-26 LAB — CA 125: Cancer Antigen (CA) 125: 6.9 U/mL (ref 0.0–38.1)

## 2023-02-26 NOTE — Patient Instructions (Signed)
It was good to see you today.  I do not see or feel any evidence of cancer recurrence on your exam.  I will see you for follow-up in 6 months.  As always, if you develop any new and concerning symptoms before your next visit, please call to see me sooner.   

## 2023-02-26 NOTE — Progress Notes (Signed)
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 Results of the tumor marker test revealed 62.7.   11/20/2020 Imaging   CT scan of abdomen and pelvis  1. There is a large, cystic mass of the right ovary with peripheral nodular soft tissue components measuring at least 19.8 x 13.3 x 19.8 cm. Findings are consistent with primary ovarian malignancy. 2. Minimal stranding of the omentum in the ventral abdomen and minimal thickening of the peritoneum in the right lower quadrant in the right paracolic gutter without evidence of discrete omental or peritoneal nodularity. Findings are modestly suspicious for early peritoneal metastatic disease. 3. No lymphadenopathy or other evidence of metastatic disease in the abdomen or pelvis. 4. Polyp or adherent gallstone near the gallbladder fundus, better assessed by prior ultrasound. 5. Large burden of stool throughout the colon and rectum.   Aortic Atherosclerosis (ICD10-I70.0).   12/03/2020 Pathology Results   SURGICAL PATHOLOGY  CASE: [id number]  PATIENT: Brooke Khan  Surgical Pathology Report   Clinical History: Ovarian mass (crm)    FINAL MICROSCOPIC DIAGNOSIS:   A. UTERUS, CERVIX, BILATERAL FALLOPIAN TUBES AND OVARIES:  Ovary, right  -  Endometrioid carcinoma, FIGO grade 3  -  See oncology table and comment below   Ovary, left:  -  No carcinoma identified   Bilateral Fallopian tubes:  -  No carcinoma identified   Uterus:  -  No carcinoma identified   Cervix:  -  No carcinoma identified   B. LYMPH NODES, RIGHT PARAAORTIC, RESECTION:  -  No carcinoma  identified in six lymph nodes (0/6)   C. LYMPH NODES, LEFT PARAAORTIC, RESECTION:  -  No carcinoma identified in five lymph nodes (0/5)   D. LYMPH NODES, RIGHT PELVIC, RESECTION:  -  No carcinoma identified in five lymph nodes (0/5)   E. LYMPH NODES, LEFT PELVIC, RESECTION:  -  No carcinoma identified in three lymph nodes (0/3)   F. OMENTUM, OMENTECTOMY:  -  No carcinoma identified   G. PERITONEUM, ANTERIOR PELVIC, BIOPSY:  -  No carcinoma identified   H. PERITONEUM, RIGHT PELVIC, BIOPSY:  -  No carcinoma identified   I. PERITONEUM, LEFT PELVIC, BIOPSY:  -  No carcinoma identified   J. PERITONEUM, POSTERIOR PELVIC, BIOPSY:  -  No carcinoma identified   K. PERITONEUM, RIGHT ABDOMINAL, BIOPSY:  -  No carcinoma identified   L. PERITONEUM, LEFT ABDOMINAL, BIOPSY:  -  No carcinoma identified    ONCOLOGY TABLE:   OVARY or FALLOPIAN TUBE or PRIMARY PERITONEUM: Resection   Procedure: Hysterectomy with bilateral salpingo-oophorectomy  Specimen Integrity: Intact  Tumor Site: Ovary, right  Tumor Size: 1.5 cm  Histologic Type: Endometrioid carcinoma  Histologic Grade: FIGO grade 3  Ovarian Surface Involvement: Not identified  Fallopian Tube Surface Involvement: Not identified  Other Tissue/ Organ Involvement: Not applicable  Largest Extrapelvic Peritoneal Focus: Not applicable  Peritoneal/Ascitic Fluid Involvement: Malignant cells not identified  Chemotherapy Response Score (CRS): Not applicable, no known presurgical therapy  Regional Lymph Nodes:       Number of Nodes with Metastasis Greater than 10 mm: 0  Number of Nodes with Metastasis 10 mm or Less (excludes isolated  tumor cells): 0       Number of Nodes with Isolated Tumor Cells (0.2 mm or less): 0       Number of Lymph Nodes Examined: 19  Distant Metastasis:       Distant Site(s) Involved: Not applicable  Pathologic Stage Classification (pTNM, AJCC 8th Edition): pT1a, pN0  Ancillary Studies: Can be performed  upon request  Representative Tumor Block: A8  Comment(s): Dr. Kenard Gower reviewed the case and agrees with the above diagnosis.     12/03/2020 Surgery   Surgeon: Quinn Axe    Operation: Robotic-assisted laparoscopic total hysterectomy with bilateral salpingoophorectomy, omentectomy, pelvic and para-aortic lymphadnectomy.     Surgeon: Quinn Axe    Operative Findings:  : 20cm cystic mass, smooth. Attachments between inferior aspect of cystic mass and posterior pelvic peritoneum. Normal appearing omentum and nodes. Invasive malignancy on evaluation. No gross extra-ovarian disease. Clinical stage I disease. No gross residual disease.    12/11/2020 Cancer Staging   Staging form: Ovary, Fallopian Tube, and Primary Peritoneal Carcinoma, AJCC 8th Edition - Pathologic stage from 12/11/2020: FIGO Stage IA (pT1a, pN0, cM0) - Signed by [name], MD on 12/11/2020 Stage prefix: Initial diagnosis   01/10/2021 Genetic Testing   Negative germline and somatic genetic testing: no pathogenic variants detected in [facility] MyRisk Panel.   No somatic variants detected in [facility] MyChoice HRD testing.  Genomic instaiblity score is negative.  The report dates are January 10, 2021.    The [facility] gene panel offered by [employer] includes sequencing and deletion/duplication testing of the following 48 genes: APC, ATM, AXIN2, BAP1, BARD1, BMPR1A, BRCA1, BRCA2, BRIP1, CHD1, CDK4, CDKN2A(p16 and p14ARF), CHEK2, CTNNA1, EGFR, EPCAM, FH, FLCN, GREM1, HOXB13, MEN1, MET, MITF , MLH1, MSH2, MSH3, MSH6, MUTYH, NTHL1, PALB2, PMS2, POLD1, POLE, PTEN, RAD51C, RAD51D, RET, SDHA, SDHB, SDHC, SDHD, SMAD4, STK11,TERT, TP53, TSC1, TSC2, and VHL.    01/15/2021 - 03/17/2021 Chemotherapy   Patient is on Treatment Plan : OVARIAN Carboplatin (AUC 6) / Paclitaxel (175) q21d x 6 cycles     04/15/2021 Tumor Marker   Patient's tumor was tested for the following markers: Ca-125. Results of the tumor marker test  revealed 7.7.   04/16/2021 Imaging   1. New left periaortic adenopathy and new left omental and mesenteric root nodules, indicative of metastatic disease. 2. Right lower lobe nodules are stable. 3. Hepatic steatosis. 4. Cholelithiasis. 5. Left renal stone. 6.  Aortic atherosclerosis (ICD10-I70.0).   06/17/2021 Imaging   1. Small RIGHT retroperitoneal nodule not significant changed. Recommend attention on follow-up. 2. Two low-density para-aortic lymph nodes unchanged from comparison exam. 3. No clear evidence omental or peritoneal metastasis. 4. Stable small pulmonary nodule in the RIGHT lower lobe.     06/19/2021 Tumor Marker   Patient's tumor was tested for the following markers: CA-125. Results of the tumor marker test revealed 6.3.   12/17/2021 Imaging   Stable small low-attenuation retroperitoneal lymph nodes in left para-aortic region. No new or progressive disease within the abdomen or pelvis.   Stable 6 mm polypoid density along the nondependent gallbladder wall consistent with gallbladder polyp. Recommend continued imaging follow-up with ultrasound in 6-12 months.   Stable tiny nonobstructing left renal calculus.   Mild increase in size of midline paraumbilical hernia, which contains only fat.   Aortic Atherosclerosis (ICD10-I70.0).   12/17/2021 Tumor Marker   Patient's tumor was tested for the following markers: CA-125.  Results of the tumor marker test revealed 6.8.   03/19/2022 Tumor Marker   Patient's tumor was tested for the following markers: CA-125. Results of the tumor marker test revealed 6.5.   06/22/2022 Tumor Marker   Patient's tumor was tested for the following markers: CA-125. Results of the tumor marker test revealed 6.3.   09/18/2022 Tumor Marker   Patient's tumor was tested for the following markers: CA-125. Results of the tumor marker test revealed 6.3.   09/18/2022 Imaging   CT ABDOMEN PELVIS W CONTRAST  Result Date: 09/18/2022 CLINICAL DATA:  Ovarian  carcinoma. Chemotherapy completed November 2022. * Tracking Code: BO * EXAM: CT ABDOMEN AND PELVIS WITH CONTRAST TECHNIQUE: Multidetector CT imaging of the abdomen and pelvis was performed using the standard protocol following bolus administration of intravenous contrast. RADIATION DOSE REDUCTION: This exam was performed according to the departmental dose-optimization program which includes automated exposure control, adjustment of the mA and/or kV according to patient size and/or use of iterative reconstruction technique. CONTRAST:  OMNIPAQUE IOHEXOL 300 MG/ML  SOLN COMPARISON:  None Available. FINDINGS: Lower chest: Lung bases are clear. Hepatobiliary: No focal hepatic lesion. 6 mm polypoid lesion within the gallbladder not changed from prior (image 27/series 9). No biliary duct dilatation. Common bile duct is normal. Pancreas: Pancreas is normal. No ductal dilatation. No pancreatic inflammation. Spleen: Normal spleen Adrenals/urinary tract: Adrenal glands and kidneys are normal. The ureters and bladder normal. Stomach/Bowel: Stomach, small bowel, appendix, and cecum are normal. The colon and rectosigmoid colon are normal. Vascular/Lymphatic: Abdominal aorta is normal caliber. No periportal or retroperitoneal adenopathy. No pelvic adenopathy. Reproductive: Post hysterectomy. Post oophorectomy. No pelvic sidewall nodularity. No adnexal nodularity. No peritoneal nodularity. No fluid within the abdomen pelvis. Other: Low-attenuation lesion in the retroperitoneum LEFT of the aortic bifurcation is decreased in size measuring 4 mm (image 50/series 9) compared to 13 mm on prior. Musculoskeletal: No aggressive osseous lesion. IMPRESSION: 1. No evidence of ovarian cancer recurrence or metastasis. 2. Interval decrease in size of low-attenuation lesion in the retroperitoneum LEFT of the aortic bifurcation. 3. Stable polypoid lesion in the gallbladder. Electronically Signed   By: Genevive Bi M.D.   On: 09/18/2022  08:24      10/25/2022 Imaging   MR Brain Wo Contrast  Result Date: 11/02/2022 CLINICAL DATA:  Provided history: Left temporal headache. New onset of headaches after age 26. Headache, new onset. EXAM: MRI HEAD WITHOUT CONTRAST TECHNIQUE: Multiplanar, multiecho pulse sequences of the brain and surrounding structures were obtained without intravenous contrast. COMPARISON:  No pertinent prior exams available for comparison. FINDINGS: Brain: Cerebral volume is normal. No cortical encephalomalacia is identified. No significant cerebral white matter disease. There is no acute infarct. No evidence of an intracranial mass. No chronic intracranial blood products. No extra-axial fluid collection. No midline shift. Vascular: Maintained flow voids within the proximal large arterial vessels. Skull and upper cervical spine: No focal suspicious marrow lesion. Sinuses/Orbits: No mass or acute finding within the imaged orbits. Minimal mucosal thickening versus small mucous retention cyst within the right maxillary sinus. Other: 11 mm ovoid predominantly T2 hyperintense focus within the superficial lobe of the right parotid gland (for instance as seen on series 12, image 3). Impression #2 will be called to the ordering clinician or representative by the Radiologist Assistant, and communication documented in the PACS or [employer]. IMPRESSION: 1. Unremarkable non-contrast MRI appearance of the brain. No evidence of an acute intracranial abnormality. 2. 11 mm ovoid predominantly T2 hyperintense focus within  the superficial lobe of the right parotid gland. This may reflect a primary parotid neoplasm, cyst or enlarged/abnormal intraparotid lymph node. ENT referral recommended. 3. Minimal mucosal thickening or small mucous retention cyst within the right maxillary sinus. Electronically Signed   By: Jackey Loge D.O.   On: 11/02/2022 17:34   CT CHEST ABDOMEN PELVIS W CONTRAST  Result Date: 10/25/2022 CLINICAL DATA:  Severe  chest, abdominal and back pain beginning last night. Metastatic ovarian carcinoma. * Tracking Code: BO * EXAM: CT CHEST, ABDOMEN, AND PELVIS WITH CONTRAST TECHNIQUE: Multidetector CT imaging of the chest, abdomen and pelvis was performed following the standard protocol during bolus administration of intravenous contrast. RADIATION DOSE REDUCTION: This exam was performed according to the departmental dose-optimization program which includes automated exposure control, adjustment of the mA and/or kV according to patient size and/or use of iterative reconstruction technique. CONTRAST:  75mL OMNIPAQUE IOHEXOL 350 MG/ML SOLN COMPARISON:  AP only CT on 09/16/2022 FINDINGS: CT CHEST FINDINGS Cardiovascular: No acute findings. Mediastinum/Lymph Nodes: No masses or pathologically enlarged lymph nodes identified. Lungs/Pleura: 2 tiny 3 mm pulmonary nodules are seen in the posterior right lower lobe on images 68 and 71 of series 5, which are nonspecific. No evidence of infiltrate or pleural effusion. Musculoskeletal:  No suspicious bone lesions identified. CT ABDOMEN AND PELVIS FINDINGS Hepatobiliary: No masses identified. Stable 7 mm polypoid lesion in the gallbladder. No evidence of cholecystitis or biliary ductal dilatation. Pancreas:  No mass or inflammatory changes. Spleen:  Within normal limits in size and appearance. Adrenals/Urinary tract: No suspicious masses or hydronephrosis. Stomach/Bowel: The appendix is enlarged, measuring 14 mm, which is new findings since previous study. Mild periappendiceal inflammatory changes are seen and several small appendicoliths are also noted. No evidence of perforation or abscess. No evidence of bowel obstruction. Vascular/Lymphatic: No pathologically enlarged lymph nodes identified. No acute vascular findings. Aortic atherosclerotic calcification incidentally noted. Reproductive: Prior hysterectomy noted. Adnexal regions are unremarkable in appearance. Other:  None. Musculoskeletal:   No suspicious bone lesions identified. IMPRESSION: Positive for acute appendicitis. No evidence of perforation or abscess. No definite evidence of recurrent or metastatic carcinoma. Two 3 mm indeterminate pulmonary nodules in right lower lobe. Recommend continued follow-up by chest CT in 3 months. Stable 7 mm gallbladder polyp. Recommend continued attention on follow-up imaging. Aortic Atherosclerosis (ICD10-I70.0). Electronically Signed   By: Danae Orleans M.D.   On: 10/25/2022 11:27      12/21/2022 Tumor Marker   Patient's tumor was tested for the following markers: CA-125. Results of the tumor marker test revealed 6.3.     Interval History: Patient reports overall doing well but continues to have some symptoms related to her urinary tract infection including dysuria which she describes as burning at the very end of urinating.  She is trying to drink plenty of fluid.  She also has some abdominal soreness as well as some back pain.  This is somewhat complicated by the fact that she has had right mid and lower back pain for about 6 months, seeing physical therapy now.  Denies any fevers, nausea, or emesis.  Past Medical/Surgical History: Past Medical History:  Diagnosis Date   Bradycardia    Chicken pox    Elevated blood pressure reading    Epigastric abdominal pain    Family history of breast cancer 12/26/2020   Family history of prostate cancer 12/26/2020   Heart murmur    since childhood- not always heard   Hyperglycemia    Hyperlipidemia  borderline- no meds   Hypothyroidism (acquired)    Lightheaded    ovarian ca 11/2020   Pre-diabetes    Vitamin D deficiency     Past Surgical History:  Procedure Laterality Date   CESAREAN SECTION     x2   cosmetic knee surgery     as child   LAPAROSCOPIC APPENDECTOMY N/A 10/25/2022   Procedure: APPENDECTOMY LAPAROSCOPIC;  Surgeon: Diamantina Monks, MD;  Location: [facility] OR;  Service: General;  Laterality: N/A;   LAPAROTOMY N/A 12/03/2020    Procedure: MINI LAPAROTOMY FOR CYST DECOMPRESSION;  Surgeon: Adolphus Birchwood, MD;  Location: [facility] ORS;  Service: Gynecology;  Laterality: N/A;   ROBOTIC ASSISTED TOTAL HYSTERECTOMY WITH BILATERAL SALPINGO OOPHERECTOMY N/A 12/03/2020   Procedure: XI ROBOTIC ASSISTED TOTAL HYSTERECTOMY WITH BILATERAL SALPINGO OOPHORECTOMY WITH PARA-AORTIC AND PELVIC LYMPHADNECTOMY AND OMENTECTOMY;  Surgeon: Adolphus Birchwood, MD;  Location: [facility] ORS;  Service: Gynecology;  Laterality: N/A;    Family History  Problem Relation Age of Onset   Elevated Lipids Mother    Prostate cancer Father        dx 67s   Elevated Lipids Father    Heart disease Father        mid 46s. had been remote smoker.    Hypertension Father    Diabetes Father        mid 42s   Dementia Father        vascular based   Skin cancer Father        SCC, dx after 29   Healthy Brother    Breast cancer Maternal Aunt 48   Prostate cancer Maternal Uncle        dx 71s   Lymphoma Paternal Uncle        dx after 71   Diabetes Maternal Grandmother    Arthritis Maternal Grandfather    Diabetes Paternal Grandfather    Cancer Cousin        unknown type; ?throat ?lymphoma d. 30   Colon cancer Neg Hx    Colon polyps Neg Hx    Esophageal cancer Neg Hx    Rectal cancer Neg Hx    Stomach cancer Neg Hx     Social History   Socioeconomic History   Marital status: Married    Spouse name: Not on file   Number of children: 2   Years of education: Not on file   Highest education level: Not on file  Occupational History   Occupation: realtor  Tobacco Use   Smoking status: Never   Smokeless tobacco: Never  Vaping Use   Vaping status: Never Used  Substance and Sexual Activity   Alcohol use: Yes    Comment: once a month   Drug use: No   Sexual activity: Yes    Partners: Male    Comment: husband  Other Topics Concern   Not on file  Social History Narrative   Married 30 years October 2020. Daughter went to [place]. Son went to [place]. 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       Hobbies: tennis, plays mahjong in womens group      Social Determinants of Health   Financial Resource Strain: Not on file  Food Insecurity: Low Risk  (12/03/2022)   Received from [facility]   Hunger Vital Sign    Worried About Running Out of  Food in the Last Year: Never true    Ran Out of Food in the Last Year: Never true  Transportation Needs: Not on file (12/03/2022)  Physical Activity: Not on file  Stress: Not on file  Social Connections: Not on file    Current Medications:  Current Outpatient Medications:    acetaminophen (TYLENOL) 500 MG tablet, Take 2 tablets (1,000 mg total) by mouth 4 (four) times daily., Disp: 120 tablet, Rfl: 3   amLODipine (NORVASC) 10 MG tablet, Take 1 tablet (10 mg total) by mouth daily. (Patient taking differently: Take 5 mg by mouth daily.), Disp: 90 tablet, Rfl: 3   calcium carbonate (TUMS - DOSED IN MG ELEMENTAL CALCIUM) 500 MG chewable tablet, Chew 1 tablet by mouth daily., Disp: , Rfl:    Cholecalciferol (VITAMIN D3) 1.25 MG (50000 UT) CAPS, Vitamin D3  5,000 iu qd, Disp: , Rfl:    cromolyn (OPTICROM) 4 % ophthalmic solution, INSTILL 1 DROP IN BOTH EYES FOUR TIMES DAILY, Disp: , Rfl:    fluorometholone (FML) 0.1 % ophthalmic suspension, 1 drop 4 (four) times daily., Disp: , Rfl:    levothyroxine (SYNTHROID) 50 MCG tablet, TAKE 1 TABLET BY MOUTH EVERY DAY, Disp: 90 tablet, Rfl: 3   rosuvastatin (CRESTOR) 5 MG tablet, Take 1 tablet (5 mg total) by mouth daily., Disp: 90 tablet, Rfl: 3   valsartan (DIOVAN) 160 MG tablet, Take 1 tablet (160 mg total) by mouth daily., Disp: 90 tablet, Rfl: 3   vitamin B-12 (CYANOCOBALAMIN) 1000 MCG tablet, Take 1,000 mcg by mouth in the morning., Disp: , Rfl:    nitrofurantoin, macrocrystal-monohydrate, (MACROBID) 100 MG capsule, Take 1 capsule (100 mg total)  by mouth 2 (two) times daily for 5 days., Disp: 10 capsule, Rfl: 0  Review of Systems: + Abdominal pain, pain with urination, back pain Denies appetite changes, fevers, chills, fatigue, unexplained weight changes. Denies hearing loss, neck lumps or masses, mouth sores, ringing in ears or voice changes. Denies cough or wheezing.  Denies shortness of breath. Denies chest pain or palpitations. Denies leg swelling. Denies abdominal distention, blood in stools, constipation, diarrhea, nausea, vomiting, or early satiety. Denies pain with intercourse, frequency, hematuria or incontinence. Denies hot flashes, pelvic pain, vaginal bleeding or vaginal discharge.   Denies joint pain or muscle pain/cramps. Denies itching, rash, or wounds. Denies dizziness, headaches, numbness or seizures. Denies swollen lymph nodes or glands, denies easy bruising or bleeding. Denies anxiety, depression, confusion, or decreased concentration.  Physical Exam: BP 138/64 (BP Location: Right Arm, Patient Position: Sitting)   Pulse 66   Temp 98.7 F (37.1 C) (Oral)   Resp 20   Wt 180 lb 9.6 oz (81.9 kg)   LMP 05/11/2014   SpO2 100%   BMI 32.51 kg/m  General: Alert, oriented, no acute distress. HEENT: Cephalic, atraumatic, sclera anicteric. Chest: Clear to auscultation bilaterally.  No wheezes or rhonchi. Cardiovascular: Regular rate and rhythm, no murmurs. Abdomen: soft, nontender.  Normoactive bowel sounds.  No masses or hepatosplenomegaly appreciated.  Well-healed incisions. Extremities: Grossly normal range of motion.  Warm, well perfused.  No edema bilaterally. Back: No CVA tenderness. Skin: No rashes or lesions noted. Lymphatics: No cervical, supraclavicular, or inguinal adenopathy. GU: Normal appearing external genitalia without erythema, excoriation, or lesions.  Speculum exam reveals mildly atrophic vaginal mucosa, polypoid beefy red <1 cm lesion noted along the cuff in the midline into the right  (previously biopsied, unchanged).  No bleeding or discharge.  Bimanual exam reveals cuff intact, lesion described  above is palpable although not firm or nodular.  No nodularity or masses.  Rectovaginal exam confirms these findings.  Laboratory & Radiologic Studies: Component Ref Range & Units 2 mo ago (12/17/22) 5 mo ago (09/16/22) 8 mo ago (06/19/22) 11 mo ago (03/18/22) 1 yr ago (12/15/21) 1 yr ago (09/15/21) 1 yr ago (06/19/21)  Cancer Antigen (CA) 125 0.0 - 38.1 U/mL 6.3 6.3 CM 6.3 CM 6.5 CM 6.8 CM 6.3 CM 6.3 C   Yesterday's CA-125 is pending  Assessment & Plan: Brooke Khan is a 57 y.o. 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  Completed 4 cycles of adjuvant chemotherapy. Negative genetic testing.   Patient is overall doing well and is NED on exam today.  She recently had a CA125 drawn which was normal.  Patient with urinary symptoms, UA concerning for urinary tract infection, started on Macrobid.  Urine culture shows staph infection, sensitivity still pending.  I let the patient know that I would date her over the weekend once sensitivities return if we need to change antibiotics.  She does not have signs or symptoms of pyelonephritis.  We discussed what these would be and what to bring her into urgent care or the emergency department over the weekend.  I suspect that her right sided mid and lower back pain, which has been present for 58-month, is musculoskeletal in nature.   Last imaging in 10/2022 essentially normal (at time of diagnosis of appendicitis). There were several small pulmonary nodules seen with recommendation for follow-up CT chest in 3 months.    We will continue with visits every 3 months.  The patient sees Dr. Bertis Ruddy in February.  I will see her back in April.  20 minutes of total time was spent for this patient encounter, including preparation, face-to-face counseling with the patient and  coordination of care, and documentation of the encounter.  Eugene Garnet, MD  Division of Gynecologic Oncology  Department of Obstetrics and Gynecology  [facility]

## 2023-02-27 LAB — URINE CULTURE: Culture: 90000 — AB

## 2023-02-28 NOTE — Progress Notes (Signed)
I let her know that antibiotic should cover infection - could someone please call her on 10/21 to check no her symptoms? Thank you

## 2023-03-01 ENCOUNTER — Ambulatory Visit: Payer: BC Managed Care – PPO | Admitting: Physical Therapy

## 2023-03-01 ENCOUNTER — Telehealth: Payer: Self-pay | Admitting: *Deleted

## 2023-03-01 ENCOUNTER — Encounter: Payer: Self-pay | Admitting: Physical Therapy

## 2023-03-01 DIAGNOSIS — M546 Pain in thoracic spine: Secondary | ICD-10-CM | POA: Diagnosis not present

## 2023-03-01 NOTE — Telephone Encounter (Signed)
Spoke with Brooke Khan and she states, "I feel so much better"  and "it took a couple days for the antibiotics to kick in but now I have no more symptoms". Advised patient to continue taking antibiotics until they are gone and call the office with any concerns or questions. Pt verbalized understanding and thanked the office for calling.

## 2023-03-01 NOTE — Telephone Encounter (Signed)
-----   Message from Carver Fila sent at 02/28/2023  9:55 AM EDT ----- I let her know that antibiotic should cover infection - could someone please call her on 10/21 to check no her symptoms? Thank you

## 2023-03-01 NOTE — Therapy (Signed)
OUTPATIENT PHYSICAL THERAPY LOWER EXTREMITY TREATMENT   Patient Name: Brooke Khan MRN: [id number] DOB:1965-07-17, 57 y.o., female Today's Date: 03/01/2023  END OF SESSION:  PT End of Session - 03/01/23 1202     Visit Number 6    Number of Visits 16    Date for PT Re-Evaluation 03/15/23    Authorization Type BCBS    PT Start Time 1103    PT Stop Time 1145    PT Time Calculation (min) 42 min    Activity Tolerance Patient tolerated treatment well    Behavior During Therapy WFL for tasks assessed/performed                Past Medical History:  Diagnosis Date   Bradycardia    Chicken pox    Elevated blood pressure reading    Epigastric abdominal pain    Family history of breast cancer 12/26/2020   Family history of prostate cancer 12/26/2020   Heart murmur    since childhood- not always heard   Hyperglycemia    Hyperlipidemia    borderline- no meds   Hypothyroidism (acquired)    Lightheaded    ovarian ca 11/2020   Pre-diabetes    Vitamin D deficiency    Past Surgical History:  Procedure Laterality Date   CESAREAN SECTION     x2   cosmetic knee surgery     as child   LAPAROSCOPIC APPENDECTOMY N/A 10/25/2022   Procedure: APPENDECTOMY LAPAROSCOPIC;  Surgeon: Diamantina Monks, MD;  Location: [facility];  Service: General;  Laterality: N/A;   LAPAROTOMY N/A 12/03/2020   Procedure: MINI LAPAROTOMY FOR CYST DECOMPRESSION;  Surgeon: Adolphus Birchwood, MD;  Location: [facility];  Service: Gynecology;  Laterality: N/A;   ROBOTIC ASSISTED TOTAL HYSTERECTOMY WITH BILATERAL SALPINGO OOPHERECTOMY N/A 12/03/2020   Procedure: XI ROBOTIC ASSISTED TOTAL HYSTERECTOMY WITH BILATERAL SALPINGO OOPHORECTOMY WITH PARA-AORTIC AND PELVIC LYMPHADNECTOMY AND OMENTECTOMY;  Surgeon: Adolphus Birchwood, MD;  Location: [facility];  Service: Gynecology;  Laterality: N/A;   Patient Active Problem List   Diagnosis Date Noted   Aortic atherosclerosis (HCC) 12/29/2022   Obesity, Class I, BMI 30-34.9 06/23/2022    Gallbladder mass 06/23/2022   Acute back pain 06/19/2021   Mild anxiety 04/07/2021   Genetic testing 01/21/2021   Family history of breast cancer 12/26/2020   Family history of prostate cancer 12/26/2020   Other constipation 12/15/2020   Elevated CA-125 12/03/2020   Ovarian cancer, right (HCC)    Elevated blood pressure reading 02/01/2019   Hypothyroidism, unspecified 10/04/2018   Vitamin D deficiency 10/04/2018    PCP: Tana Conch  REFERRING PROVIDER: Tana Conch  REFERRING DIAG: R thoracic pain  THERAPY DIAG:  Pain in thoracic spine  Rationale for Evaluation and Treatment: Rehabilitation  ONSET DATE:  May 2024.   SUBJECTIVE:   SUBJECTIVE STATEMENT: Pt with less pain than last week, but still having soreness/spasm in am and during the day. Has been taking antibiotic and is feeling better from UTI today.   Eval:  Pt states R low  pain, had CT in may- started after that. June- she had appendicitis,  feels some of that pain never went away.  Now: pain with walking:  also has some anterior hip pain- but has been better more recently.  Main complaint: Mid R thoracic pain, into R center t spine.   More rare- in low back or QL.  Aches pretty constantly, sitting may be worse Heating pad, better, and laying down, She is sleeping well,  minimal pain.  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   States when she has Severe pain- feels lightheaded- has happened 5-6 times.   PERTINENT HISTORY: Ovarian CA, hysterectomy, appendectomy,   PAIN:  Are you having pain? Yes: NPRS scale: 3-6 /10 Pain location: R thoracic/lumbar region Pain description: sore, constant  Aggravating factors: activity, sitting, constant  Relieving factors: none stated    PRECAUTIONS: None  WEIGHT BEARING RESTRICTIONS: No  FALLS:  Has patient fallen in last 6 months? No   PLOF: Independent  PATIENT GOALS:  Decreased pain   NEXT MD VISIT:   OBJECTIVE:   DIAGNOSTIC  FINDINGS:   PATIENT SURVEYS:  FOTO:   COGNITION: Overall cognitive status: Within functional limits for tasks assessed     SENSATION: WFL  EDEMA:   POSTURE:  Posteriorly:  R shoulder slightly lower, hips and ASIS appear even.   PALPATION:  Increased tightness in R mid and low thoracic paraspinals, no pain to palpate today No pain to palpate or mobilize ribs, no pain with PA s  to thoracic spine.  States general/constant soreness in this region.   LOWER EXTREMITY ROM:  Lumbar: WFL Hips: WFL Knees: WFL   LOWER EXTREMITY MMT:  MMT Left eval Right  eval  Hip flexion 4 4  Hip extension    Hip abduction 4 4  Hip adduction    Hip internal rotation    Hip external rotation    Knee flexion    Knee extension    Ankle dorsiflexion    Ankle plantarflexion    Ankle inversion    Ankle eversion     (Blank rows = not tested)  LOWER EXTREMITY SPECIAL TESTS:     TODAY'S TREATMENT:                                                                                                                              DATE:    03/01/2023   Therapeutic Exercise: Aerobic: Supine: ;   shoulder protraction x 15, slow;  SLR 2x 10 bil with TA;   supine march with TA x 15;   Bridging x 15;   Quadruped:  bird/dog. LEs only 2 x 10;  Seated:  Standing:   Rows x 15 GTB;  Stretches:  LTR x 10;     Cat/cow x 15; childs pose, center, L, R x 3 ea bil;  Neuromuscular Re-education: Manual Therapy:  Therapeutic Activity: Self Care:    Therapeutic Exercise: Aerobic: Supine:  shoulder flexion for thoracic/ lat stretch x 10, cueing to decrease arching of back;   shoulder protraction x 10, slow;  SLR 2x 10 bil with TA;   supine march with TA x 15;   Bridging x 15;   Seated:  Standing:   Rows x 15;  Stretches:  LTR x 10;   SKTC 30 sec x 2 bil;   Cat/cow x 15; childs pose, center, L, R x 3  ea bil;  Neuromuscular Re-education: Manual Therapy:  Therapeutic Activity: Self Care: Modalities:  moist heat pack with supine ther ex to thoraicc and lumbar spine     02/15/23: Therapeutic Exercise: Aerobic: Supine:  shoulder flexion for thoracic/ lat stretch x 10, cueing to decrease arching of back;  shoulder protraction x 10, slow ;  Plank on knees 20 sec x 4;   SLR 2x 10 bil with TA;   supine march with TA x 15;   Bridging x 15;   Seated:  Standing:   Rows x 15;  Stretches:   LTR x 10;  SKTC 30 sec x 2 bil;   Cat/cow x 15;  Neuromuscular Re-education: Manual Therapy:  Therapeutic Activity: Self Care:   PATIENT EDUCATION:  Education details: updated and reviewed HEP Person educated: Patient Education method: Explanation, Demonstration, Tactile cues, Verbal cues, and Handouts Education comprehension: verbalized understanding, returned demonstration, verbal cues required, tactile cues required, and needs further education   HOME EXERCISE PROGRAM: Access Code: [email or web address]   ASSESSMENT:  CLINICAL IMPRESSION: 03/01/2023 Pt with less pain today from previous visit. She continues to have soreness in lateral tibs/thoracic region. No pain with t-spine palpation or mobilization today. She continues to have weakness and instability in core, and will benefit from progressive strengthening as tolerated. She was able to perform ther ex for strengthening today without increased pain. She is challenged with TA and keeping stabilization during exercises due to weakness, and has tendency for increased lordosis position.   Eval: Patient presents with primary complaint of  pain in thoracic region. She has increased muscle tension on R, and some pain at central t-spine. She has minimal pain to palpate today, but notes constant soreness that has been ongoing.  Pt with decreased ability for full functional activities. Pt will  benefit from skilled PT to improve deficits and pain and to return to PLOF.   OBJECTIVE IMPAIRMENTS: decreased activity tolerance, decreased mobility, decreased ROM, decreased  strength, increased muscle spasms, impaired flexibility, improper body mechanics, and pain.   ACTIVITY LIMITATIONS: carrying, lifting, bending, sitting, standing, squatting, hygiene/grooming, and locomotion level  PARTICIPATION LIMITATIONS: meal prep, cleaning, laundry, driving, shopping, community activity, occupation, and yard work  PERSONAL FACTORS:  none  are also affecting patient's functional outcome.   REHAB POTENTIAL: Good  CLINICAL DECISION MAKING: Stable/uncomplicated  EVALUATION COMPLEXITY: Low   GOALS: Goals reviewed with patient? Yes  SHORT TERM GOALS: Target date: 02/01/23  Pt to be independent with initial HEP  Goal status: INITIAL   LONG TERM GOALS: Target date: 114/24  Pt to be independent with final HEP  Goal status: INITIAL  2.  Pt to report decreased pain in thoracic and lumbar spine to be 0-2/10  Goal status: INITIAL  3.  Pt to demo improved strength of hips and core to be [facility] to improve pain and mobility.   Goal status: INITIAL  4.  Pt to demo soft tissue limitations in thoracic musculature, to be [facility], to decrease pain.   Goal status: INITIAL    PLAN:  PT FREQUENCY: 1-2x/week  PT DURATION: 8 weeks  PLANNED INTERVENTIONS: Therapeutic exercises, Therapeutic activity, Neuromuscular re-education, Patient/Family education, Self Care, Joint mobilization, Joint manipulation, Stair training, Orthotic/Fit training, DME instructions, Aquatic Therapy, Dry Needling, Electrical stimulation, Cryotherapy, Moist heat, Taping, Ultrasound, Ionotophoresis 4mg /ml Dexamethasone, Manual therapy,  Vasopneumatic device, Traction, Spinal manipulation, Spinal mobilization,Balance training, Gait training,   PLAN FOR NEXT SESSION: manual as needed/tolerated for R thoracic paraspinals, focus on core activation and control.  Sedalia Muta, PT, DPT 12:05 PM  03/01/23

## 2023-03-11 ENCOUNTER — Encounter: Payer: BC Managed Care – PPO | Admitting: Physical Therapy

## 2023-03-12 ENCOUNTER — Ambulatory Visit (HOSPITAL_COMMUNITY)
Admission: RE | Admit: 2023-03-12 | Discharge: 2023-03-12 | Disposition: A | Discharge status: Home / Self Care | Payer: BC Managed Care – PPO | Source: Ambulatory Visit | Attending: Gynecologic Oncology | Admitting: Gynecologic Oncology

## 2023-03-12 ENCOUNTER — Encounter: Payer: Self-pay | Admitting: Family Medicine

## 2023-03-12 DIAGNOSIS — R918 Other nonspecific abnormal finding of lung field: Secondary | ICD-10-CM | POA: Diagnosis not present

## 2023-03-12 MED ORDER — AMLODIPINE BESYLATE 5 MG PO TABS: 5.0000 mg | ORAL_TABLET | Freq: Every day | ORAL | 3 refills | Status: DC

## 2023-03-12 MED ORDER — IOHEXOL 300 MG/ML  SOLN
75.0000 mL | Freq: Once | INTRAMUSCULAR | Status: AC | PRN
  Administered 2023-03-12: 75 mL via INTRAVENOUS

## 2023-03-18 ENCOUNTER — Encounter: Payer: Self-pay | Admitting: Gynecologic Oncology

## 2023-03-18 ENCOUNTER — Other Ambulatory Visit: Payer: BC Managed Care – PPO

## 2023-03-19 ENCOUNTER — Ambulatory Visit: Payer: BC Managed Care – PPO | Admitting: Gynecologic Oncology

## 2023-03-22 ENCOUNTER — Ambulatory Visit: Payer: BC Managed Care – PPO | Admitting: Physical Therapy

## 2023-03-22 ENCOUNTER — Encounter: Payer: Self-pay | Admitting: Physical Therapy

## 2023-03-22 DIAGNOSIS — M546 Pain in thoracic spine: Secondary | ICD-10-CM | POA: Diagnosis not present

## 2023-03-22 NOTE — Therapy (Signed)
OUTPATIENT PHYSICAL THERAPY LOWER EXTREMITY TREATMENT/Re-Cert   Patient Name: Brooke Khan MRN: [id number] DOB:Aug 14, 1965, 57 y.o., female Today's Date: 03/22/2023  END OF SESSION:  PT End of Session - 03/22/23 1107     Visit Number 7    Number of Visits 16    Date for PT Re-Evaluation 03/15/23    Authorization Type BCBS    PT Start Time 1107    PT Stop Time 1145    PT Time Calculation (min) 38 min    Activity Tolerance Patient tolerated treatment well    Behavior During Therapy WFL for tasks assessed/performed                Past Medical History:  Diagnosis Date   Bradycardia    Chicken pox    Elevated blood pressure reading    Epigastric abdominal pain    Family history of breast cancer 12/26/2020   Family history of prostate cancer 12/26/2020   Heart murmur    since childhood- not always heard   Hyperglycemia    Hyperlipidemia    borderline- no meds   Hypothyroidism (acquired)    Lightheaded    ovarian ca 11/2020   Pre-diabetes    Vitamin D deficiency    Past Surgical History:  Procedure Laterality Date   CESAREAN SECTION     x2   cosmetic knee surgery     as child   LAPAROSCOPIC APPENDECTOMY N/A 10/25/2022   Procedure: APPENDECTOMY LAPAROSCOPIC;  Surgeon: Diamantina Monks, MD;  Location: [facility];  Service: General;  Laterality: N/A;   LAPAROTOMY N/A 12/03/2020   Procedure: MINI LAPAROTOMY FOR CYST DECOMPRESSION;  Surgeon: Adolphus Birchwood, MD;  Location: [facility];  Service: Gynecology;  Laterality: N/A;   ROBOTIC ASSISTED TOTAL HYSTERECTOMY WITH BILATERAL SALPINGO OOPHERECTOMY N/A 12/03/2020   Procedure: XI ROBOTIC ASSISTED TOTAL HYSTERECTOMY WITH BILATERAL SALPINGO OOPHORECTOMY WITH PARA-AORTIC AND PELVIC LYMPHADNECTOMY AND OMENTECTOMY;  Surgeon: Adolphus Birchwood, MD;  Location: [facility];  Service: Gynecology;  Laterality: N/A;   Patient Active Problem List   Diagnosis Date Noted   Aortic atherosclerosis (HCC) 12/29/2022   Obesity, Class I, BMI 30-34.9  06/23/2022   Gallbladder mass 06/23/2022   Acute back pain 06/19/2021   Mild anxiety 04/07/2021   Genetic testing 01/21/2021   Family history of breast cancer 12/26/2020   Family history of prostate cancer 12/26/2020   Other constipation 12/15/2020   Elevated CA-125 12/03/2020   Ovarian cancer, right (HCC)    Elevated blood pressure reading 02/01/2019   Hypothyroidism, unspecified 10/04/2018   Vitamin D deficiency 10/04/2018    PCP: Tana Conch  REFERRING PROVIDER: Tana Conch  REFERRING DIAG: R thoracic pain  THERAPY DIAG:  Pain in thoracic spine  Rationale for Evaluation and Treatment: Rehabilitation  ONSET DATE:  May 2024.   SUBJECTIVE:   SUBJECTIVE STATEMENT: Pt states no pain for about 2 weeks. Has been doing stretches, and regular activity. She has felt some mild soreness/intermittent in front of her L hip a few times, difficult to sit.   Eval:  Pt states R low  pain, had CT in may- started after that. June- she had appendicitis,  feels some of that pain never went away.  Now: pain with walking:  also has some anterior hip pain- but has been better more recently.  Main complaint: Mid R thoracic pain, into R center t spine.   More rare- in low back or QL.  Aches pretty constantly, sitting may be worse Heating pad, better, and laying  down, She is sleeping well, minimal pain.  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   States when she has Severe pain- feels lightheaded- has happened 5-6 times.   PERTINENT HISTORY: Ovarian CA, hysterectomy, appendectomy,   PAIN: 0 /10 Pain location: R thoracic/lumbar region Pain description: sore, constant  Aggravating factors: activity, sitting, constant  Relieving factors: none stated    PRECAUTIONS: None  WEIGHT BEARING RESTRICTIONS: No  FALLS:  Has patient fallen in last 6 months? No   PLOF: Independent  PATIENT GOALS:  Decreased pain   NEXT MD VISIT:   OBJECTIVE:   DIAGNOSTIC  FINDINGS:   PATIENT SURVEYS:  FOTO:   COGNITION: Overall cognitive status: Within functional limits for tasks assessed     SENSATION: WFL  EDEMA:   POSTURE:  Posteriorly:  R shoulder slightly lower, hips and ASIS appear even.   PALPATION:  Increased tightness in R mid and low thoracic paraspinals, no pain to palpate today No pain to palpate or mobilize ribs, no pain with PA s  to thoracic spine.  States general/constant soreness in this region.   LOWER EXTREMITY ROM:  Lumbar: WFL Hips: WFL Knees: WFL   LOWER EXTREMITY MMT:  MMT Left 03/22/23 Right  03/22/2023   Hip flexion 4+ 4+  Hip extension    Hip abduction 4+ 4+  Hip adduction    Hip internal rotation    Hip external rotation    Knee flexion    Knee extension    Ankle dorsiflexion    Ankle plantarflexion    Ankle inversion    Ankle eversion     (Blank rows = not tested)  LOWER EXTREMITY SPECIAL TESTS:     TODAY'S TREATMENT:                                                                                                                              DATE:    03/22/2023   Therapeutic Exercise: Aerobic: Supine:    SLR 2x 10 bil with TA;    Bridging 2 x 10;   Quadruped:  bird/dog. LEs only  x 10;  UE/LE x 10;  S/L hip abd 2 x10 on L Seated:  Standing:   Rows 2 x 10 GTB; paloff press Blue TB x 15 bil  Stretches:  Cat/cow x 15;   childs pose, center, L, R x 3 ea bil; Standing hip flexor at counter x 2 min;  Neuromuscular Re-education: Manual Therapy:  Therapeutic Activity: Self Care:    Therapeutic Exercise: Aerobic: Supine:  shoulder flexion for thoracic/ lat stretch x 10, cueing to decrease arching of back;   shoulder protraction x 10, slow;  SLR 2x 10 bil with TA;   supine march with TA x 15;   Bridging x 15;   Seated:  Standing:   Rows x 15;  Stretches:  LTR x 10;   SKTC 30 sec x 2 bil;   Cat/cow x  15; childs pose, center, L, R x 3 ea bil;  Neuromuscular Re-education: Manual Therapy:   Therapeutic Activity: Self Care: Modalities: moist heat pack with supine ther ex to thoraicc and lumbar spine     02/15/23: Therapeutic Exercise: Aerobic: Supine:  shoulder flexion for thoracic/ lat stretch x 10, cueing to decrease arching of back;  shoulder protraction x 10, slow ;  Plank on knees 20 sec x 4;   SLR 2x 10 bil with TA;   supine march with TA x 15;   Bridging x 15;   Seated:  Standing:   Rows x 15;  Stretches:   LTR x 10;  SKTC 30 sec x 2 bil;   Cat/cow x 15;  Neuromuscular Re-education: Manual Therapy:  Therapeutic Activity: Self Care:   PATIENT EDUCATION:  Education details: updated and reviewed HEP Person educated: Patient Education method: Explanation, Demonstration, Tactile cues, Verbal cues, and Handouts Education comprehension: verbalized understanding, returned demonstration, verbal cues required, tactile cues required, and needs further education   HOME EXERCISE PROGRAM: Access Code: [email or web address]   ASSESSMENT:  CLINICAL IMPRESSION: 03/22/2023 Pt progressing well with less pain. She is doing well with HEP portion for mobility. She requires mod cuing for TA activation to decrease thoracic extension with most movements. Focus for core strength and education on these for HEP today. Pt will benefit from continued care, for focus on improving core strength and stability for improved body mechanics with activity.   Eval: Patient presents with primary complaint of  pain in thoracic region. She has increased muscle tension on R, and some pain at central t-spine. She has minimal pain to palpate today, but notes constant soreness that has been ongoing.  Pt with decreased ability for full functional activities. Pt will  benefit from skilled PT to improve deficits and pain and to return to PLOF.   OBJECTIVE IMPAIRMENTS: decreased activity tolerance, decreased mobility, decreased ROM, decreased strength, increased muscle spasms, impaired flexibility, improper body  mechanics, and pain.   ACTIVITY LIMITATIONS: carrying, lifting, bending, sitting, standing, squatting, hygiene/grooming, and locomotion level  PARTICIPATION LIMITATIONS: meal prep, cleaning, laundry, driving, shopping, community activity, occupation, and yard work  PERSONAL FACTORS:  none  are also affecting patient's functional outcome.   REHAB POTENTIAL: Good  CLINICAL DECISION MAKING: Stable/uncomplicated  EVALUATION COMPLEXITY: Low   GOALS: Goals reviewed with patient? Yes  SHORT TERM GOALS: Target date: 02/01/23  Pt to be independent with initial HEP  Goal status: MET   LONG TERM GOALS: Target date: 04/19/2023   Pt to be independent with final HEP  Goal status: In progress  2.  Pt to report decreased pain in thoracic and lumbar spine to be 0-2/10  Goal status: MET  3.  Pt to demo improved strength of hips and core to be [facility] to improve pain and mobility.   Goal status: in progress   4.  Pt to demo soft tissue limitations in thoracic musculature, to be [facility], to decrease pain.   Goal status: MET    PLAN:  PT FREQUENCY: 1-2x/week  PT DURATION: 4 weeks  PLANNED INTERVENTIONS: Therapeutic exercises, Therapeutic activity, Neuromuscular re-education, Patient/Family education, Self Care, Joint mobilization, Joint manipulation, Stair training, Orthotic/Fit training, DME instructions, Aquatic Therapy, Dry Needling, Electrical stimulation, Cryotherapy, Moist heat, Taping, Ultrasound, Ionotophoresis 4mg /ml Dexamethasone, Manual therapy,  Vasopneumatic device, Traction, Spinal manipulation, Spinal mobilization,Balance training, Gait training,   PLAN FOR NEXT SESSION:progress strength and core stability    Sedalia Muta, PT, DPT 11:08 AM  03/22/23

## 2023-03-25 ENCOUNTER — Telehealth: Payer: Self-pay | Admitting: Oncology

## 2023-03-25 NOTE — Telephone Encounter (Signed)
Called Brooke Khan and advised her of her CT chest results. Per Dr. Pricilla Holm, the small pulmonary nodules that we have been following are stable in size.  We can consider an additional follow up CT scan in 12 months.  There are no new or concerning findings.  She verbalized understanding and did not have any questions.

## 2023-03-29 ENCOUNTER — Ambulatory Visit: Payer: BC Managed Care – PPO | Admitting: Physical Therapy

## 2023-03-29 ENCOUNTER — Encounter: Payer: Self-pay | Admitting: Physical Therapy

## 2023-03-29 DIAGNOSIS — M546 Pain in thoracic spine: Secondary | ICD-10-CM | POA: Diagnosis not present

## 2023-03-29 NOTE — Therapy (Signed)
OUTPATIENT PHYSICAL THERAPY LOWER EXTREMITY TREATMENT   Patient Name: Brooke Khan MRN: [id number] DOB:1965/07/02, 57 y.o., female Today's Date: 03/29/2023  END OF SESSION:  PT End of Session - 03/29/23 1021     Visit Number 8    Number of Visits 16    Date for PT Re-Evaluation 04/19/23    Authorization Type [employer]  recert done at visit 7    PT Start Time 1017    PT Stop Time 1058    PT Time Calculation (min) 41 min    Activity Tolerance Patient tolerated treatment well    Behavior During Therapy [facility] for tasks assessed/performed                Past Medical History:  Diagnosis Date   Bradycardia    Chicken pox    Elevated blood pressure reading    Epigastric abdominal pain    Family history of breast cancer 12/26/2020   Family history of prostate cancer 12/26/2020   Heart murmur    since childhood- not always heard   Hyperglycemia    Hyperlipidemia    borderline- no meds   Hypothyroidism (acquired)    Lightheaded    ovarian ca 11/2020   Pre-diabetes    Vitamin D deficiency    Past Surgical History:  Procedure Laterality Date   CESAREAN SECTION     x2   cosmetic knee surgery     as child   LAPAROSCOPIC APPENDECTOMY N/A 10/25/2022   Procedure: APPENDECTOMY LAPAROSCOPIC;  Surgeon: Diamantina Monks, MD;  Location: [facility] OR;  Service: General;  Laterality: N/A;   LAPAROTOMY N/A 12/03/2020   Procedure: MINI LAPAROTOMY FOR CYST DECOMPRESSION;  Surgeon: Adolphus Birchwood, MD;  Location: [facility];  Service: Gynecology;  Laterality: N/A;   ROBOTIC ASSISTED TOTAL HYSTERECTOMY WITH BILATERAL SALPINGO OOPHERECTOMY N/A 12/03/2020   Procedure: XI ROBOTIC ASSISTED TOTAL HYSTERECTOMY WITH BILATERAL SALPINGO OOPHORECTOMY WITH PARA-AORTIC AND PELVIC LYMPHADNECTOMY AND OMENTECTOMY;  Surgeon: Adolphus Birchwood, MD;  Location: [facility];  Service: Gynecology;  Laterality: N/A;   Patient Active Problem List   Diagnosis Date Noted   Aortic atherosclerosis (HCC) 12/29/2022   Obesity, Class I,  BMI 30-34.9 06/23/2022   Gallbladder mass 06/23/2022   Acute back pain 06/19/2021   Mild anxiety 04/07/2021   Genetic testing 01/21/2021   Family history of breast cancer 12/26/2020   Family history of prostate cancer 12/26/2020   Other constipation 12/15/2020   Elevated CA-125 12/03/2020   Ovarian cancer, right (HCC)    Elevated blood pressure reading 02/01/2019   Hypothyroidism, unspecified 10/04/2018   Vitamin D deficiency 10/04/2018    PCP: Tana Conch  REFERRING PROVIDER: Tana Conch  REFERRING DIAG: R thoracic pain  THERAPY DIAG:  Pain in thoracic spine  Rationale for Evaluation and Treatment: Rehabilitation  ONSET DATE:  May 2024.   SUBJECTIVE:   SUBJECTIVE STATEMENT: Pt  was doing very well last week with no pain. Did play tennis this last week 3 days, and states mild discomfort since then. Has been doing some of HEP.   Eval:  Pt states R low  pain, had CT in may- started after that. June- she had appendicitis,  feels some of that pain never went away.  Now: pain with walking:  also has some anterior hip pain- but has been better more recently.  Main complaint: Mid R thoracic pain, into R center t spine.   More rare- in low back or QL.  Aches pretty constantly, sitting may be worse Heating  pad, better, and laying down, She is sleeping well, minimal pain.  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   States when she has Severe pain- feels lightheaded- has happened 5-6 times.   PERTINENT HISTORY: Ovarian CA, hysterectomy, appendectomy,   PAIN: 0 /10 Pain location: R thoracic/lumbar region Pain description: sore, constant  Aggravating factors: activity, sitting, constant  Relieving factors: none stated    PRECAUTIONS: None  WEIGHT BEARING RESTRICTIONS: No  FALLS:  Has patient fallen in last 6 months? No   PLOF: Independent  PATIENT GOALS:  Decreased pain   NEXT MD VISIT:   OBJECTIVE:   DIAGNOSTIC FINDINGS:   PATIENT  SURVEYS:  FOTO:   COGNITION: Overall cognitive status: Within functional limits for tasks assessed     SENSATION: WFL  EDEMA:   POSTURE:  Posteriorly:  R shoulder slightly lower, hips and ASIS appear even.   PALPATION:  Increased tightness in R mid and low thoracic paraspinals, no pain to palpate today No pain to palpate or mobilize ribs, no pain with PA s  to thoracic spine.  States general/constant soreness in this region.   LOWER EXTREMITY ROM:  Lumbar: WFL Hips: WFL Knees: WFL   LOWER EXTREMITY MMT:  MMT Left 03/22/23 Right  03/22/2023   Hip flexion 4+ 4+  Hip extension    Hip abduction 4+ 4+  Hip adduction    Hip internal rotation    Hip external rotation    Knee flexion    Knee extension    Ankle dorsiflexion    Ankle plantarflexion    Ankle inversion    Ankle eversion     (Blank rows = not tested)  LOWER EXTREMITY SPECIAL TESTS:     TODAY'S TREATMENT:                                                                                                                              DATE:    03/29/2023   Therapeutic Exercise: Aerobic:  UBE 5 min, fwd and bwd.  Supine:    SLR 2x 10 bil with TA;    supine march with UE flex x 15;  (tried dead bug at 90/90- difficult on back)  Quadruped:  SA presses x 10;   UE/LE  x 12;  Seated:  Standing:   Rows 2 x 10 GTB;   standing rotation GTB x 12 bil;  diagonals x 15 bil RTB;  Stretches:    childs pose, center, L, R x 3 ea bil;   Neuromuscular Re-education: Manual Therapy:  Therapeutic Activity: Self Care:    Therapeutic Exercise: Aerobic: Supine:  shoulder flexion for thoracic/ lat stretch x 10, cueing to decrease arching of back;   shoulder protraction x 10, slow;  SLR 2x 10 bil with TA;   supine march with TA x 15;   Bridging x 15;   Seated:  Standing:   Rows x 15;  Stretches:  LTR x  10;   SKTC 30 sec x 2 bil;   Cat/cow x 15; childs pose, center, L, R x 3 ea bil;  Neuromuscular Re-education: Manual  Therapy:  Therapeutic Activity: Self Care: Modalities: moist heat pack with supine ther ex to thoraicc and lumbar spine     02/15/23: Therapeutic Exercise: Aerobic: Supine:  shoulder flexion for thoracic/ lat stretch x 10, cueing to decrease arching of back;  shoulder protraction x 10, slow ;  Plank on knees 20 sec x 4;   SLR 2x 10 bil with TA;   supine march with TA x 15;   Bridging x 15;   Seated:  Standing:   Rows x 15;  Stretches:   LTR x 10;  SKTC 30 sec x 2 bil;   Cat/cow x 15;  Neuromuscular Re-education: Manual Therapy:  Therapeutic Activity: Self Care:   PATIENT EDUCATION:  Education details: updated and reviewed HEP Person educated: Patient Education method: Explanation, Demonstration, Tactile cues, Verbal cues, and Handouts Education comprehension: verbalized understanding, returned demonstration, verbal cues required, tactile cues required, and needs further education   HOME EXERCISE PROGRAM: Access Code: [email or web address]   ASSESSMENT:  CLINICAL IMPRESSION: 03/29/2023 Pt with decreasing pain levels. She is still quite challenged with core strength and stabilization in most positions, and will benefit from continued progression with this. Reviewed importance of core utilization with tennis swing.   Eval: Patient presents with primary complaint of  pain in thoracic region. She has increased muscle tension on R, and some pain at central t-spine. She has minimal pain to palpate today, but notes constant soreness that has been ongoing.  Pt with decreased ability for full functional activities. Pt will  benefit from skilled PT to improve deficits and pain and to return to PLOF.   OBJECTIVE IMPAIRMENTS: decreased activity tolerance, decreased mobility, decreased ROM, decreased strength, increased muscle spasms, impaired flexibility, improper body mechanics, and pain.   ACTIVITY LIMITATIONS: carrying, lifting, bending, sitting, standing, squatting, hygiene/grooming, and  locomotion level  PARTICIPATION LIMITATIONS: meal prep, cleaning, laundry, driving, shopping, community activity, occupation, and yard work  PERSONAL FACTORS:  none  are also affecting patient's functional outcome.   REHAB POTENTIAL: Good  CLINICAL DECISION MAKING: Stable/uncomplicated  EVALUATION COMPLEXITY: Low   GOALS: Goals reviewed with patient? Yes  SHORT TERM GOALS: Target date: 02/01/23  Pt to be independent with initial HEP  Goal status: MET   LONG TERM GOALS: Target date: 04/19/2023   Pt to be independent with final HEP  Goal status: In progress  2.  Pt to report decreased pain in thoracic and lumbar spine to be 0-2/10  Goal status: MET  3.  Pt to demo improved strength of hips and core to be [facility] to improve pain and mobility.   Goal status: in progress   4.  Pt to demo soft tissue limitations in thoracic musculature, to be [facility], to decrease pain.   Goal status: MET    PLAN:  PT FREQUENCY: 1-2x/week  PT DURATION: 4 weeks  PLANNED INTERVENTIONS: Therapeutic exercises, Therapeutic activity, Neuromuscular re-education, Patient/Family education, Self Care, Joint mobilization, Joint manipulation, Stair training, Orthotic/Fit training, DME instructions, Aquatic Therapy, Dry Needling, Electrical stimulation, Cryotherapy, Moist heat, Taping, Ultrasound, Ionotophoresis 4mg /ml Dexamethasone, Manual therapy,  Vasopneumatic device, Traction, Spinal manipulation, Spinal mobilization,Balance training, Gait training,   PLAN FOR NEXT SESSION: progress strength and core stability    Sedalia Muta, PT, DPT 11:02 AM  03/29/23

## 2023-04-05 ENCOUNTER — Ambulatory Visit: Payer: BC Managed Care – PPO | Admitting: Physical Therapy

## 2023-04-05 ENCOUNTER — Encounter: Payer: Self-pay | Admitting: Physical Therapy

## 2023-04-05 DIAGNOSIS — M546 Pain in thoracic spine: Secondary | ICD-10-CM | POA: Diagnosis not present

## 2023-04-05 NOTE — Therapy (Signed)
OUTPATIENT PHYSICAL THERAPY LOWER EXTREMITY TREATMENT   Patient Name: Brooke Khan MRN: [id number] DOB:1966-03-27, 57 y.o., female Today's Date: 04/05/2023  END OF SESSION:  PT End of Session - 04/05/23 1105     Visit Number 9    Number of Visits 16    Date for PT Re-Evaluation 04/19/23    Authorization Type [employer]  recert done at visit 7    PT Start Time 1105    PT Stop Time 1145    PT Time Calculation (min) 40 min    Activity Tolerance Patient tolerated treatment well    Behavior During Therapy [facility] for tasks assessed/performed                Past Medical History:  Diagnosis Date   Bradycardia    Chicken pox    Elevated blood pressure reading    Epigastric abdominal pain    Family history of breast cancer 12/26/2020   Family history of prostate cancer 12/26/2020   Heart murmur    since childhood- not always heard   Hyperglycemia    Hyperlipidemia    borderline- no meds   Hypothyroidism (acquired)    Lightheaded    ovarian ca 11/2020   Pre-diabetes    Vitamin D deficiency    Past Surgical History:  Procedure Laterality Date   CESAREAN SECTION     x2   cosmetic knee surgery     as child   LAPAROSCOPIC APPENDECTOMY N/A 10/25/2022   Procedure: APPENDECTOMY LAPAROSCOPIC;  Surgeon: Diamantina Monks, MD;  Location: [facility];  Service: General;  Laterality: N/A;   LAPAROTOMY N/A 12/03/2020   Procedure: MINI LAPAROTOMY FOR CYST DECOMPRESSION;  Surgeon: Adolphus Birchwood, MD;  Location: [facility];  Service: Gynecology;  Laterality: N/A;   ROBOTIC ASSISTED TOTAL HYSTERECTOMY WITH BILATERAL SALPINGO OOPHERECTOMY N/A 12/03/2020   Procedure: XI ROBOTIC ASSISTED TOTAL HYSTERECTOMY WITH BILATERAL SALPINGO OOPHORECTOMY WITH PARA-AORTIC AND PELVIC LYMPHADNECTOMY AND OMENTECTOMY;  Surgeon: Adolphus Birchwood, MD;  Location: [facility];  Service: Gynecology;  Laterality: N/A;   Patient Active Problem List   Diagnosis Date Noted   Aortic atherosclerosis (HCC) 12/29/2022   Obesity, Class I,  BMI 30-34.9 06/23/2022   Gallbladder mass 06/23/2022   Acute back pain 06/19/2021   Mild anxiety 04/07/2021   Genetic testing 01/21/2021   Family history of breast cancer 12/26/2020   Family history of prostate cancer 12/26/2020   Other constipation 12/15/2020   Elevated CA-125 12/03/2020   Ovarian cancer, right (HCC)    Elevated blood pressure reading 02/01/2019   Hypothyroidism, unspecified 10/04/2018   Vitamin D deficiency 10/04/2018    PCP: Tana Conch  REFERRING PROVIDER: Tana Conch  REFERRING DIAG: R thoracic pain  THERAPY DIAG:  Pain in thoracic spine  Rationale for Evaluation and Treatment: Rehabilitation  ONSET DATE:  May 2024.   SUBJECTIVE:   SUBJECTIVE STATEMENT: Pt  doing well this week with minimal pain   Eval:  Pt states R low  pain, had CT in may- started after that. June- she had appendicitis,  feels some of that pain never went away.  Now: pain with walking:  also has some anterior hip pain- but has been better more recently.  Main complaint: Mid R thoracic pain, into R center t spine.   More rare- in low back or QL.  Aches pretty constantly, sitting may be worse Heating pad, better, and laying down, She is sleeping well, minimal pain.  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   States when she has Severe pain- feels lightheaded- has happened 5-6 times.   PERTINENT HISTORY: Ovarian CA, hysterectomy, appendectomy,   PAIN: 0 /10 Pain location: R thoracic/lumbar region Pain description: sore, constant  Aggravating factors: activity, sitting, constant  Relieving factors: none stated    PRECAUTIONS: None  WEIGHT BEARING RESTRICTIONS: No  FALLS:  Has patient fallen in last 6 months? No   PLOF: Independent  PATIENT GOALS:  Decreased pain   NEXT MD VISIT:   OBJECTIVE:   DIAGNOSTIC FINDINGS:   PATIENT SURVEYS:  FOTO:   COGNITION: Overall cognitive status: Within functional limits for tasks  assessed     SENSATION: WFL  EDEMA:   POSTURE:  Posteriorly:  R shoulder slightly lower, hips and ASIS appear even.   PALPATION:  Increased tightness in R mid and low thoracic paraspinals, no pain to palpate today No pain to palpate or mobilize ribs, no pain with PA s  to thoracic spine.  States general/constant soreness in this region.   LOWER EXTREMITY ROM:  Lumbar: WFL Hips: WFL Knees: WFL   LOWER EXTREMITY MMT:  MMT Left 03/22/23 Right  03/22/2023   Hip flexion 4+ 4+  Hip extension    Hip abduction 4+ 4+  Hip adduction    Hip internal rotation    Hip external rotation    Knee flexion    Knee extension    Ankle dorsiflexion    Ankle plantarflexion    Ankle inversion    Ankle eversion     (Blank rows = not tested)  LOWER EXTREMITY SPECIAL TESTS:     TODAY'S TREATMENT:                                                                                                                              DATE:   04/05/2023   Therapeutic Exercise: Aerobic:  UBE 5 min, fwd and bwd.  Supine:    SLR 2x 10 bil with TA;    90/90 heel taps 2 x 10;  dead bug feet on table x 10 ;    Quadruped:  SA presses x 10;    Seated:  Standing:  Ue flex/rtb x 15 with TA;   standing rotation GTB x 12 bil;  paloff press x 15 bil double RTB;  Stretches:   Cat/cow x 15;   Neuromuscular Re-education: Manual Therapy:  Therapeutic Activity: Self Care:   Therapeutic Exercise: Aerobic:  UBE 5 min, fwd and bwd.  Supine:    SLR 2x 10 bil with TA;    supine march with UE flex x 15;  (tried dead bug at 90/90- difficult on back)  Quadruped:  SA presses x 10;   UE/LE  x 12;  Seated:  Standing:   Rows 2 x 10 GTB;   standing rotation GTB x 12 bil;  diagonals x 15 bil RTB;  Stretches:    childs pose, center, L, R x 3 ea bil;  Neuromuscular Re-education: Manual Therapy:  Therapeutic Activity: Self Care:    Therapeutic Exercise: Aerobic: Supine:  shoulder flexion for thoracic/ lat stretch x  10, cueing to decrease arching of back;   shoulder protraction x 10, slow;  SLR 2x 10 bil with TA;   supine march with TA x 15;   Bridging x 15;   Seated:  Standing:   Rows x 15;  Stretches:  LTR x 10;   SKTC 30 sec x 2 bil;   Cat/cow x 15; childs pose, center, L, R x 3 ea bil;  Neuromuscular Re-education: Manual Therapy:  Therapeutic Activity: Self Care: Modalities: moist heat pack with supine ther ex to thoraicc and lumbar spine     02/15/23: Therapeutic Exercise: Aerobic: Supine:  shoulder flexion for thoracic/ lat stretch x 10, cueing to decrease arching of back;  shoulder protraction x 10, slow ;  Plank on knees 20 sec x 4;   SLR 2x 10 bil with TA;   supine march with TA x 15;   Bridging x 15;   Seated:  Standing:   Rows x 15;  Stretches:   LTR x 10;  SKTC 30 sec x 2 bil;   Cat/cow x 15;  Neuromuscular Re-education: Manual Therapy:  Therapeutic Activity: Self Care:   PATIENT EDUCATION:  Education details: updated and reviewed HEP Person educated: Patient Education method: Explanation, Demonstration, Tactile cues, Verbal cues, and Handouts Education comprehension: verbalized understanding, returned demonstration, verbal cues required, tactile cues required, and needs further education   HOME EXERCISE PROGRAM: Access Code: [email or web address]   ASSESSMENT:  CLINICAL IMPRESSION: 04/05/2023 Pt with decreasing pain levels. Reviewed TA activation in supine today. She is still quite challenged with core strength and stabilization in most positions, and will benefit from continued progression with this.   Eval: Patient presents with primary complaint of  pain in thoracic region. She has increased muscle tension on R, and some pain at central t-spine. She has minimal pain to palpate today, but notes constant soreness that has been ongoing.  Pt with decreased ability for full functional activities. Pt will  benefit from skilled PT to improve deficits and pain and to return to PLOF.    OBJECTIVE IMPAIRMENTS: decreased activity tolerance, decreased mobility, decreased ROM, decreased strength, increased muscle spasms, impaired flexibility, improper body mechanics, and pain.   ACTIVITY LIMITATIONS: carrying, lifting, bending, sitting, standing, squatting, hygiene/grooming, and locomotion level  PARTICIPATION LIMITATIONS: meal prep, cleaning, laundry, driving, shopping, community activity, occupation, and yard work  PERSONAL FACTORS:  none  are also affecting patient's functional outcome.   REHAB POTENTIAL: Good  CLINICAL DECISION MAKING: Stable/uncomplicated  EVALUATION COMPLEXITY: Low   GOALS: Goals reviewed with patient? Yes  SHORT TERM GOALS: Target date: 02/01/23  Pt to be independent with initial HEP  Goal status: MET   LONG TERM GOALS: Target date: 04/19/2023   Pt to be independent with final HEP  Goal status: In progress  2.  Pt to report decreased pain in thoracic and lumbar spine to be 0-2/10  Goal status: MET  3.  Pt to demo improved strength of hips and core to be [facility] to improve pain and mobility.   Goal status: in progress   4.  Pt to demo soft tissue limitations in thoracic musculature, to be [facility], to decrease pain.   Goal status: MET    PLAN:  PT FREQUENCY: 1-2x/week  PT DURATION: 4 weeks  PLANNED INTERVENTIONS: Therapeutic exercises, Therapeutic activity, Neuromuscular re-education, Patient/Family education, Self Care, Joint mobilization,  Joint manipulation, Stair training, Orthotic/Fit training, DME instructions, Aquatic Therapy, Dry Needling, Electrical stimulation, Cryotherapy, Moist heat, Taping, Ultrasound, Ionotophoresis 4mg /ml Dexamethasone, Manual therapy,  Vasopneumatic device, Traction, Spinal manipulation, Spinal mobilization,Balance training, Gait training,   PLAN FOR NEXT SESSION: progress strength and core stability    Sedalia Muta, PT, DPT 12:12 PM  04/05/23

## 2023-04-12 ENCOUNTER — Ambulatory Visit: Payer: BC Managed Care – PPO | Admitting: Physical Therapy

## 2023-04-12 ENCOUNTER — Encounter: Payer: Self-pay | Admitting: Physical Therapy

## 2023-04-12 DIAGNOSIS — M546 Pain in thoracic spine: Secondary | ICD-10-CM

## 2023-04-12 NOTE — Therapy (Signed)
OUTPATIENT PHYSICAL THERAPY LOWER EXTREMITY TREATMENT   Patient Name: Brooke Khan MRN: [id number] DOB:[birth year], 57 y.o., female Today's Date: 04/12/23  END OF SESSION:  PT End of Session - 04/12/23 1105     Visit Number 10    Number of Visits 16    Date for PT Re-Evaluation 04/19/23    Authorization Type [employer]  recert done at visit 7    PT Start Time 1105    PT Stop Time 1145    PT Time Calculation (min) 40 min    Activity Tolerance Patient tolerated treatment well    Behavior During Therapy [facility] for tasks assessed/performed                Past Medical History:  Diagnosis Date   Bradycardia    Chicken pox    Elevated blood pressure reading    Epigastric abdominal pain    Family history of breast cancer 12/26/2020   Family history of prostate cancer 12/26/2020   Heart murmur    since childhood- not always heard   Hyperglycemia    Hyperlipidemia    borderline- no meds   Hypothyroidism (acquired)    Lightheaded    ovarian ca 11/2020   Pre-diabetes    Vitamin D deficiency    Past Surgical History:  Procedure Laterality Date   CESAREAN SECTION     x2   cosmetic knee surgery     as child   LAPAROSCOPIC APPENDECTOMY N/A 10/25/2022   Procedure: APPENDECTOMY LAPAROSCOPIC;  Surgeon: Diamantina Monks, MD;  Location: [facility] OR;  Service: General;  Laterality: N/A;   LAPAROTOMY N/A 12/03/2020   Procedure: MINI LAPAROTOMY FOR CYST DECOMPRESSION;  Surgeon: Adolphus Birchwood, MD;  Location: [facility];  Service: Gynecology;  Laterality: N/A;   ROBOTIC ASSISTED TOTAL HYSTERECTOMY WITH BILATERAL SALPINGO OOPHERECTOMY N/A 12/03/2020   Procedure: XI ROBOTIC ASSISTED TOTAL HYSTERECTOMY WITH BILATERAL SALPINGO OOPHORECTOMY WITH PARA-AORTIC AND PELVIC LYMPHADNECTOMY AND OMENTECTOMY;  Surgeon: Adolphus Birchwood, MD;  Location: [facility];  Service: Gynecology;  Laterality: N/A;   Patient Active Problem List   Diagnosis Date Noted   Aortic atherosclerosis (HCC) 12/29/2022   Obesity, Class I, BMI  30-34.9 06/23/2022   Gallbladder mass 06/23/2022   Acute back pain 06/19/2021   Mild anxiety 04/07/2021   Genetic testing 01/21/2021   Family history of breast cancer 12/26/2020   Family history of prostate cancer 12/26/2020   Other constipation 12/15/2020   Elevated CA-125 12/03/2020   Ovarian cancer, right (HCC)    Elevated blood pressure reading 02/01/2019   Hypothyroidism, unspecified 10/04/2018   Vitamin D deficiency 10/04/2018    PCP: Tana Conch  REFERRING PROVIDER: Tana Conch  REFERRING DIAG: R thoracic pain  THERAPY DIAG:  Pain in thoracic spine  Rationale for Evaluation and Treatment: Rehabilitation  ONSET DATE:  May 2024.   SUBJECTIVE:   SUBJECTIVE STATEMENT: Pt reports doing well. States mild soreness today after doing a lot of work this weekend and not doing much of HEP.   Eval:  Pt states R low  pain, had CT in may- started after that. June- she had appendicitis,  feels some of that pain never went away.  Now: pain with walking:  also has some anterior hip pain- but has been better more recently.  Main complaint: Mid R thoracic pain, into R center t spine.   More rare- in low back or QL.  Aches pretty constantly, sitting may be worse Heating pad, better, and laying down, She is sleeping well,  minimal pain.  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   States when she has Severe pain- feels lightheaded- has happened 5-6 times.   PERTINENT HISTORY: Ovarian CA, hysterectomy, appendectomy,   PAIN: 0 /10 Pain location: R thoracic/lumbar region Pain description: sore, constant  Aggravating factors: activity, sitting, constant  Relieving factors: none stated    PRECAUTIONS: None  WEIGHT BEARING RESTRICTIONS: No  FALLS:  Has patient fallen in last 6 months? No   PLOF: Independent  PATIENT GOALS:  Decreased pain   NEXT MD VISIT:   OBJECTIVE:   DIAGNOSTIC FINDINGS:   PATIENT SURVEYS:  FOTO:   COGNITION: Overall  cognitive status: Within functional limits for tasks assessed     SENSATION: WFL  EDEMA:   POSTURE:  Posteriorly:  R shoulder slightly lower, hips and ASIS appear even.   PALPATION:  Increased tightness in R mid and low thoracic paraspinals, no pain to palpate today No pain to palpate or mobilize ribs, no pain with PA s  to thoracic spine.  States general/constant soreness in this region.   LOWER EXTREMITY ROM:  Lumbar: WFL Hips: WFL Knees: WFL   LOWER EXTREMITY MMT:  MMT Left 03/22/23 Right  03/22/2023   Hip flexion 4+ 4+  Hip extension    Hip abduction 4+ 4+  Hip adduction    Hip internal rotation    Hip external rotation    Knee flexion    Knee extension    Ankle dorsiflexion    Ankle plantarflexion    Ankle inversion    Ankle eversion     (Blank rows = not tested)  LOWER EXTREMITY SPECIAL TESTS:     TODAY'S TREATMENT:                                                                                                                              DATE:   04/12/2023   Therapeutic Exercise: Aerobic:  UBE 5 min, fwd and bwd.  Supine:    SLR 2x 10 bil with TA;    supine march with TA x 15 ;    Quadruped: UE x 10, LE x 10,  UE/LE x 12;  modified crunch x 12;  Seated:  Standing:   standing rotation GTB x 12 bil;  paloff press x 15 bil double RTB;  Stretches:   Cat/cow x 15;   Neuromuscular Re-education: Manual Therapy:  Therapeutic Activity: Self Care:   Therapeutic Exercise: Aerobic:  UBE 5 min, fwd and bwd.  Supine:    SLR 2x 10 bil with TA;    supine march with UE flex x 15;  (tried dead bug at 90/90- difficult on back)  Quadruped:  SA presses x 10;   UE/LE  x 12;  Seated:  Standing:   Rows 2 x 10 GTB;   standing rotation GTB x 12 bil;  diagonals x 15 bil RTB;  Stretches:    childs pose, center, L,  R x 3 ea bil;   Neuromuscular Re-education: Manual Therapy:  Therapeutic Activity: Self Care:   PATIENT EDUCATION:  Education details: updated and  reviewed HEP Person educated: Patient Education method: Explanation, Demonstration, Tactile cues, Verbal cues, and Handouts Education comprehension: verbalized understanding, returned demonstration, verbal cues required, tactile cues required, and needs further education   HOME EXERCISE PROGRAM: Access Code: [email or web address]   ASSESSMENT:  CLINICAL IMPRESSION: 04/12/2023 Pt with decreasing pain levels. She has mild soreness at times in mid thoracic region. She has been able to progress strength and stabilization in the last few weeks. She is still challenged with strengthening exercises, but is doing better with ability for TA contraction. She functionally is doing all activities needed. Reviewed final HEP today, and need to increase frequency and compliance with strengthening exercises. Improved core strength will also help when playing tennis. Pt doing well at this time, and has not made any further, significant improvements. She is able to get soreness to decrease with stretching and HEP but has not done consistently. Pt ready for d/c to HEP at this time, pt in agreement with plan.   Eval: Patient presents with primary complaint of  pain in thoracic region. She has increased muscle tension on R, and some pain at central t-spine. She has minimal pain to palpate today, but notes constant soreness that has been ongoing.  Pt with decreased ability for full functional activities. Pt will  benefit from skilled PT to improve deficits and pain and to return to PLOF.   OBJECTIVE IMPAIRMENTS: decreased activity tolerance, decreased mobility, decreased ROM, decreased strength, increased muscle spasms, impaired flexibility, improper body mechanics, and pain.   ACTIVITY LIMITATIONS: carrying, lifting, bending, sitting, standing, squatting, hygiene/grooming, and locomotion level  PARTICIPATION LIMITATIONS: meal prep, cleaning, laundry, driving, shopping, community activity, occupation, and yard work  PERSONAL  FACTORS:  none  are also affecting patient's functional outcome.   REHAB POTENTIAL: Good  CLINICAL DECISION MAKING: Stable/uncomplicated  EVALUATION COMPLEXITY: Low   GOALS: Goals reviewed with patient? Yes  SHORT TERM GOALS: Target date: 02/01/23  Pt to be independent with initial HEP  Goal status: MET   LONG TERM GOALS: Target date: 04/19/2023   Pt to be independent with final HEP  Goal status: MET  2.  Pt to report decreased pain in thoracic and lumbar spine to be 0-2/10  Goal status: MET  3.  Pt to demo improved strength of hips and core to be [facility] to improve pain and mobility.   Goal status: partially MET  4.  Pt to demo soft tissue limitations in thoracic musculature, to be [facility], to decrease pain.   Goal status: MET    PLAN:  PT FREQUENCY: 1-2x/week  PT DURATION: 4 weeks  PLANNED INTERVENTIONS: Therapeutic exercises, Therapeutic activity, Neuromuscular re-education, Patient/Family education, Self Care, Joint mobilization, Joint manipulation, Stair training, Orthotic/Fit training, DME instructions, Aquatic Therapy, Dry Needling, Electrical stimulation, Cryotherapy, Moist heat, Taping, Ultrasound, Ionotophoresis 4mg /ml Dexamethasone, Manual therapy,  Vasopneumatic device, Traction, Spinal manipulation, Spinal mobilization,Balance training, Gait training,   PLAN FOR NEXT SESSION:    Sedalia Muta, PT, DPT 11:05 AM  04/12/23  PHYSICAL THERAPY DISCHARGE SUMMARY  Visits from Start of Care: 10  Plan: Patient agrees to discharge.  Patient goals were met. Patient is being discharged due to meeting the stated rehab goals.     Sedalia Muta, PT, DPT 2:58 PM  04/12/23

## 2023-04-19 ENCOUNTER — Encounter: Payer: BC Managed Care – PPO | Admitting: Physical Therapy

## 2023-05-21 ENCOUNTER — Telehealth: Payer: Self-pay | Admitting: Family Medicine

## 2023-05-21 ENCOUNTER — Other Ambulatory Visit: Payer: Self-pay

## 2023-05-21 DIAGNOSIS — E039 Hypothyroidism, unspecified: Secondary | ICD-10-CM

## 2023-05-21 NOTE — Telephone Encounter (Signed)
 Called and spoke with pt and she has not been keeping a list of her readings. I asked pt to keep a list of her readings twice a day from today until Monday so that Dr. Katrinka can see the trend. Pt voiced understanding and will report via mychart her readings.

## 2023-05-21 NOTE — Telephone Encounter (Signed)
 Pt states BP is still high and would like a call back on what to do. Please advise.     valsartan (DIOVAN) 160 MG tablet   amLODipine (NORVASC) 5 MG tablet

## 2023-05-25 ENCOUNTER — Encounter: Payer: Self-pay | Admitting: Family Medicine

## 2023-05-28 DIAGNOSIS — Z1231 Encounter for screening mammogram for malignant neoplasm of breast: Secondary | ICD-10-CM | POA: Diagnosis not present

## 2023-06-21 ENCOUNTER — Inpatient Hospital Stay: Payer: BC Managed Care – PPO | Attending: Hematology

## 2023-06-21 DIAGNOSIS — C561 Malignant neoplasm of right ovary: Secondary | ICD-10-CM | POA: Diagnosis not present

## 2023-06-21 DIAGNOSIS — Z79899 Other long term (current) drug therapy: Secondary | ICD-10-CM | POA: Diagnosis not present

## 2023-06-21 DIAGNOSIS — K429 Umbilical hernia without obstruction or gangrene: Secondary | ICD-10-CM | POA: Insufficient documentation

## 2023-06-21 DIAGNOSIS — N200 Calculus of kidney: Secondary | ICD-10-CM | POA: Insufficient documentation

## 2023-06-21 DIAGNOSIS — Z79890 Hormone replacement therapy (postmenopausal): Secondary | ICD-10-CM | POA: Insufficient documentation

## 2023-06-21 DIAGNOSIS — K8020 Calculus of gallbladder without cholecystitis without obstruction: Secondary | ICD-10-CM | POA: Insufficient documentation

## 2023-06-21 DIAGNOSIS — I700 Atherosclerosis of aorta: Secondary | ICD-10-CM | POA: Insufficient documentation

## 2023-06-21 DIAGNOSIS — K760 Fatty (change of) liver, not elsewhere classified: Secondary | ICD-10-CM | POA: Diagnosis not present

## 2023-06-21 DIAGNOSIS — K3580 Unspecified acute appendicitis: Secondary | ICD-10-CM | POA: Insufficient documentation

## 2023-06-21 LAB — CBC WITH DIFFERENTIAL/PLATELET
Abs Immature Granulocytes: 0.03 10*3/uL (ref 0.00–0.07)
Basophils Absolute: 0 10*3/uL (ref 0.0–0.1)
Basophils Relative: 1 %
Eosinophils Absolute: 0.1 10*3/uL (ref 0.0–0.5)
Eosinophils Relative: 2 %
HCT: 40.7 % (ref 36.0–46.0)
Hemoglobin: 13.3 g/dL (ref 12.0–15.0)
Immature Granulocytes: 1 %
Lymphocytes Relative: 25 %
Lymphs Abs: 1.5 10*3/uL (ref 0.7–4.0)
MCH: 30.3 pg (ref 26.0–34.0)
MCHC: 32.7 g/dL (ref 30.0–36.0)
MCV: 92.7 fL (ref 80.0–100.0)
Monocytes Absolute: 0.4 10*3/uL (ref 0.1–1.0)
Monocytes Relative: 6 %
Neutro Abs: 3.8 10*3/uL (ref 1.7–7.7)
Neutrophils Relative %: 65 %
Platelets: 336 10*3/uL (ref 150–400)
RBC: 4.39 MIL/uL (ref 3.87–5.11)
RDW: 13.2 % (ref 11.5–15.5)
WBC: 5.8 10*3/uL (ref 4.0–10.5)
nRBC: 0 % (ref 0.0–0.2)

## 2023-06-21 LAB — COMPREHENSIVE METABOLIC PANEL
ALT: 34 U/L (ref 0–44)
AST: 18 U/L (ref 15–41)
Albumin: 4.5 g/dL (ref 3.5–5.0)
Alkaline Phosphatase: 70 U/L (ref 38–126)
Anion gap: 5 (ref 5–15)
BUN: 23 mg/dL — ABNORMAL HIGH (ref 6–20)
CO2: 28 mmol/L (ref 22–32)
Calcium: 9.7 mg/dL (ref 8.9–10.3)
Chloride: 109 mmol/L (ref 98–111)
Creatinine, Ser: 0.96 mg/dL (ref 0.44–1.00)
GFR, Estimated: >60 mL/min (ref >60)
Glucose, Bld: 114 mg/dL — ABNORMAL HIGH (ref 70–99)
Potassium: 4.4 mmol/L (ref 3.5–5.1)
Sodium: 142 mmol/L (ref 135–145)
Total Bilirubin: 0.3 mg/dL (ref 0.0–1.2)
Total Protein: 7.2 g/dL (ref 6.5–8.1)

## 2023-06-22 LAB — CA 125: Cancer Antigen (CA) 125: 5.3 U/mL (ref 0.0–38.1)

## 2023-06-24 ENCOUNTER — Inpatient Hospital Stay: Payer: BC Managed Care – PPO | Admitting: Hematology and Oncology

## 2023-06-24 ENCOUNTER — Encounter: Payer: Self-pay | Admitting: Hematology and Oncology

## 2023-06-24 VITALS — BP 140/41 | HR 58 | Temp 98.0°F | Resp 18 | Ht 62.5 in | Wt 180.6 lb

## 2023-06-24 DIAGNOSIS — K429 Umbilical hernia without obstruction or gangrene: Secondary | ICD-10-CM | POA: Diagnosis not present

## 2023-06-24 DIAGNOSIS — Z79899 Other long term (current) drug therapy: Secondary | ICD-10-CM | POA: Diagnosis not present

## 2023-06-24 DIAGNOSIS — N200 Calculus of kidney: Secondary | ICD-10-CM | POA: Diagnosis not present

## 2023-06-24 DIAGNOSIS — C561 Malignant neoplasm of right ovary: Secondary | ICD-10-CM

## 2023-06-24 DIAGNOSIS — K828 Other specified diseases of gallbladder: Secondary | ICD-10-CM | POA: Diagnosis not present

## 2023-06-24 DIAGNOSIS — I700 Atherosclerosis of aorta: Secondary | ICD-10-CM | POA: Diagnosis not present

## 2023-06-24 DIAGNOSIS — Z79890 Hormone replacement therapy (postmenopausal): Secondary | ICD-10-CM | POA: Diagnosis not present

## 2023-06-24 DIAGNOSIS — K8020 Calculus of gallbladder without cholecystitis without obstruction: Secondary | ICD-10-CM | POA: Diagnosis not present

## 2023-06-24 DIAGNOSIS — K3580 Unspecified acute appendicitis: Secondary | ICD-10-CM | POA: Diagnosis not present

## 2023-06-24 DIAGNOSIS — K760 Fatty (change of) liver, not elsewhere classified: Secondary | ICD-10-CM | POA: Diagnosis not present

## 2023-06-24 NOTE — Progress Notes (Signed)
[name] OFFICE PROGRESS NOTE  Patient Care Team: [name], MD as PCP - General (Family Medicine)  ASSESSMENT & PLAN:  Ovarian cancer, right [name]) I have reviewed multiple imaging studies with the patient  We reviewed results of her tumor marker She has signs or symptoms to suggest cancer reoccurrence I plan to see her again in 12 months with repeat tumor marker only   Gallbladder mass I have reviewed multiple imaging studies dated back to 2017 She had gallbladder mass present since then It was described as a polypoid lesion Since it has been stable for over many years, it is not clear the utility of continuous monitoring For future reference, if she wants this to be monitored, I would recommend ultrasound to minimize exposure to radiation I would like the patient to discuss this with her primary care doctor  No orders of the defined types were placed in this encounter.   All questions were answered. The patient knows to call the clinic with any problems, questions or concerns. The total time spent in the appointment was 20 minutes encounter with patients including review of chart and various tests results, discussions about plan of care and coordination of care plan   [name], MD 06/24/2023 10:34 AM  INTERVAL HISTORY: Please see below for problem oriented charting. she returns for surveillance follow-up for history of ovarian cancer She has been doing well Denies abdominal symptoms We discussed test results I have reviewed multiple imaging studies from the past  REVIEW OF SYSTEMS:   Constitutional: Denies fevers, chills or abnormal weight loss Eyes: Denies blurriness of vision Ears, nose, mouth, throat, and face: Denies mucositis or sore throat Respiratory: Denies cough, dyspnea or wheezes Cardiovascular: Denies palpitation, chest discomfort or lower extremity swelling Gastrointestinal:  Denies nausea, heartburn or change in bowel  habits Skin: Denies abnormal skin rashes Lymphatics: Denies new lymphadenopathy or easy bruising Neurological:Denies numbness, tingling or new weaknesses Behavioral/Psych: Mood is stable, no new changes  All other systems were reviewed with the patient and are negative.  I have reviewed the past medical history, past surgical history, social history and family history with the patient and they are unchanged from previous note.  ALLERGIES:  is allergic to codeine.  MEDICATIONS:  Current Outpatient Medications  Medication Sig Dispense Refill   acetaminophen (TYLENOL) 500 MG tablet Take 2 tablets (1,000 mg total) by mouth 4 (four) times daily. 120 tablet 3   amLODipine (NORVASC) 5 MG tablet Take 1 tablet (5 mg total) by mouth daily. 90 tablet 3   calcium carbonate (TUMS - DOSED IN MG ELEMENTAL CALCIUM) 500 MG chewable tablet Chew 1 tablet by mouth daily.     Cholecalciferol (VITAMIN D3) 1.25 MG (50000 UT) CAPS Vitamin D3  5,000 iu qd     cromolyn (OPTICROM) 4 % ophthalmic solution INSTILL 1 DROP IN BOTH EYES FOUR TIMES DAILY     fluorometholone (FML) 0.1 % ophthalmic suspension 1 drop 4 (four) times daily.     levothyroxine (SYNTHROID) 50 MCG tablet TAKE 1 TABLET BY MOUTH EVERY DAY 90 tablet 3   rosuvastatin (CRESTOR) 5 MG tablet Take 1 tablet (5 mg total) by mouth daily. 90 tablet 3   valsartan (DIOVAN) 160 MG tablet Take 1 tablet (160 mg total) by mouth daily. 90 tablet 3   vitamin B-12 (CYANOCOBALAMIN) 1000 MCG tablet Take 1,000 mcg by mouth in the morning.     No current facility-administered medications for this visit.    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 Results of the tumor marker test revealed 62.7.   11/20/2020  Imaging   CT scan of abdomen and pelvis  1. There is a large, cystic mass of the right ovary with peripheral nodular soft tissue components measuring at least 19.8 x 13.3 x 19.8 cm. Findings are consistent with primary ovarian malignancy. 2. Minimal stranding of the omentum in the ventral abdomen and minimal thickening of the peritoneum in the right lower quadrant in the right paracolic gutter without evidence of discrete omental or peritoneal nodularity. Findings are modestly suspicious for early peritoneal metastatic disease. 3. No lymphadenopathy or other evidence of metastatic disease in the abdomen or pelvis. 4. Polyp or adherent gallstone near the gallbladder fundus, better assessed by prior ultrasound. 5. Large burden of stool throughout the colon and rectum.   Aortic Atherosclerosis (ICD10-I70.0).   12/03/2020 Pathology Results   SURGICAL PATHOLOGY  CASE: [id number]  PATIENT: Brooke Khan  Surgical Pathology Report   Clinical History: Ovarian mass (crm)    FINAL MICROSCOPIC DIAGNOSIS:   A. UTERUS, CERVIX, BILATERAL FALLOPIAN TUBES AND OVARIES:  Ovary, right  -  Endometrioid carcinoma, FIGO grade 3  -  See oncology table and comment below   Ovary, left:  -  No carcinoma identified   Bilateral Fallopian tubes:  -  No carcinoma identified   Uterus:  -  No carcinoma identified   Cervix:  -  No carcinoma identified   B. LYMPH NODES, RIGHT PARAAORTIC, RESECTION:  -  No carcinoma identified in six lymph nodes (0/6)   C. LYMPH NODES, LEFT PARAAORTIC, RESECTION:  -  No carcinoma identified in five lymph nodes (0/5)   D. LYMPH NODES, RIGHT PELVIC, RESECTION:  -  No carcinoma identified in five lymph nodes (0/5)   E. LYMPH NODES, LEFT PELVIC, RESECTION:  -  No carcinoma identified in three lymph nodes (0/3)   F. OMENTUM, OMENTECTOMY:  -  No carcinoma identified   G. PERITONEUM, ANTERIOR PELVIC, BIOPSY:  -  No carcinoma identified   H. PERITONEUM, RIGHT PELVIC,  BIOPSY:  -  No carcinoma identified   I. PERITONEUM, LEFT PELVIC, BIOPSY:  -  No carcinoma identified   J. PERITONEUM, POSTERIOR PELVIC, BIOPSY:  -  No carcinoma identified   K. PERITONEUM, RIGHT ABDOMINAL, BIOPSY:  -  No carcinoma identified   L. PERITONEUM, LEFT ABDOMINAL, BIOPSY:  -  No carcinoma identified    ONCOLOGY TABLE:   OVARY or FALLOPIAN TUBE or PRIMARY PERITONEUM: Resection   Procedure: Hysterectomy with bilateral salpingo-oophorectomy  Specimen Integrity: Intact  Tumor Site: Ovary, right  Tumor Size: 1.5 cm  Histologic Type: Endometrioid carcinoma  Histologic Grade: FIGO grade 3  Ovarian Surface Involvement: Not identified  Fallopian Tube Surface Involvement: Not identified  Other Tissue/ Organ Involvement: Not applicable  Largest Extrapelvic Peritoneal Focus: Not applicable  Peritoneal/Ascitic Fluid Involvement: Malignant cells not identified  Chemotherapy Response Score (CRS): Not applicable, no known presurgical therapy  Regional Lymph Nodes:       Number of Nodes with Metastasis Greater than 10 mm: 0       Number of Nodes with Metastasis 10 mm or Less (excludes isolated  tumor cells): 0       Number  of Nodes with Isolated Tumor Cells (0.2 mm or less): 0       Number of Lymph Nodes Examined: 19  Distant Metastasis:       Distant Site(s) Involved: Not applicable  Pathologic Stage Classification (pTNM, AJCC 8th Edition): pT1a, pN0  Ancillary Studies: Can be performed upon request  Representative Tumor Block: A8  Comment(s): Dr. Kenard Gower reviewed the case and agrees with the above diagnosis.     12/03/2020 Surgery   Surgeon: Quinn Axe    Operation: Robotic-assisted laparoscopic total hysterectomy with bilateral salpingoophorectomy, omentectomy, pelvic and para-aortic lymphadnectomy.     Surgeon: Quinn Axe    Operative Findings:  : 20cm cystic mass, smooth. Attachments between inferior aspect of cystic mass and posterior pelvic  peritoneum. Normal appearing omentum and nodes. Invasive malignancy on evaluation. No gross extra-ovarian disease. Clinical stage I disease. No gross residual disease.    12/11/2020 Cancer Staging   Staging form: Ovary, Fallopian Tube, and Primary Peritoneal Carcinoma, AJCC 8th Edition - Pathologic stage from 12/11/2020: FIGO Stage IA (pT1a, pN0, cM0) - Signed by [name], MD on 12/11/2020 Stage prefix: Initial diagnosis   01/10/2021 Genetic Testing   Negative germline and somatic genetic testing: no pathogenic variants detected in [employer] MyRisk Panel.   No somatic variants detected in [employer] [facility] HRD testing.  Genomic instaiblity score is negative.  The report dates are January 10, 2021.    The [facility] gene panel offered by [employer] includes sequencing and deletion/duplication testing of the following 48 genes: APC, ATM, AXIN2, BAP1, BARD1, BMPR1A, BRCA1, BRCA2, BRIP1, CHD1, CDK4, CDKN2A(p16 and p14ARF), CHEK2, CTNNA1, EGFR, EPCAM, FH, FLCN, GREM1, HOXB13, MEN1, MET, MITF , MLH1, MSH2, MSH3, MSH6, MUTYH, NTHL1, PALB2, PMS2, POLD1, POLE, PTEN, RAD51C, RAD51D, RET, SDHA, SDHB, SDHC, SDHD, SMAD4, STK11,TERT, TP53, TSC1, TSC2, and VHL.    01/15/2021 - 03/17/2021 Chemotherapy   Patient is on Treatment Plan : OVARIAN Carboplatin (AUC 6) / Paclitaxel (175) q21d x 6 cycles     04/15/2021 Tumor Marker   Patient's tumor was tested for the following markers: Ca-125. Results of the tumor marker test revealed 7.7.   04/16/2021 Imaging   1. New left periaortic adenopathy and new left omental and mesenteric root nodules, indicative of metastatic disease. 2. Right lower lobe nodules are stable. 3. Hepatic steatosis. 4. Cholelithiasis. 5. Left renal stone. 6.  Aortic atherosclerosis (ICD10-I70.0).   06/17/2021 Imaging   1. Small RIGHT retroperitoneal nodule not significant changed. Recommend attention on follow-up. 2. Two low-density para-aortic lymph nodes unchanged from comparison  exam. 3. No clear evidence omental or peritoneal metastasis. 4. Stable small pulmonary nodule in the RIGHT lower lobe.     06/19/2021 Tumor Marker   Patient's tumor was tested for the following markers: CA-125. Results of the tumor marker test revealed 6.3.   12/17/2021 Imaging   Stable small low-attenuation retroperitoneal lymph nodes in left para-aortic region. No new or progressive disease within the abdomen or pelvis.   Stable 6 mm polypoid density along the nondependent gallbladder wall consistent with gallbladder polyp. Recommend continued imaging follow-up with ultrasound in 6-12 months.   Stable tiny nonobstructing left renal calculus.   Mild increase in size of midline paraumbilical hernia, which contains only fat.   Aortic Atherosclerosis (ICD10-I70.0).   12/17/2021 Tumor Marker   Patient's tumor was tested for the following markers: CA-125. Results of the tumor marker test revealed 6.8.   03/19/2022 Tumor Marker   Patient's tumor was tested for the following  markers: CA-125. Results of the tumor marker test revealed 6.5.   06/22/2022 Tumor Marker   Patient's tumor was tested for the following markers: CA-125. Results of the tumor marker test revealed 6.3.   09/18/2022 Tumor Marker   Patient's tumor was tested for the following markers: CA-125. Results of the tumor marker test revealed 6.3.   09/18/2022 Imaging   CT ABDOMEN PELVIS W CONTRAST  Result Date: 09/18/2022 CLINICAL DATA:  Ovarian carcinoma. Chemotherapy completed November 2022. * Tracking Code: BO * EXAM: CT ABDOMEN AND PELVIS WITH CONTRAST TECHNIQUE: Multidetector CT imaging of the abdomen and pelvis was performed using the standard protocol following bolus administration of intravenous contrast. RADIATION DOSE REDUCTION: This exam was performed according to the departmental dose-optimization program which includes automated exposure control, adjustment of the mA and/or kV according to patient size and/or use of  iterative reconstruction technique. CONTRAST:  OMNIPAQUE IOHEXOL 300 MG/ML  SOLN COMPARISON:  None Available. FINDINGS: Lower chest: Lung bases are clear. Hepatobiliary: No focal hepatic lesion. 6 mm polypoid lesion within the gallbladder not changed from prior (image 27/series 9). No biliary duct dilatation. Common bile duct is normal. Pancreas: Pancreas is normal. No ductal dilatation. No pancreatic inflammation. Spleen: Normal spleen Adrenals/urinary tract: Adrenal glands and kidneys are normal. The ureters and bladder normal. Stomach/Bowel: Stomach, small bowel, appendix, and cecum are normal. The colon and rectosigmoid colon are normal. Vascular/Lymphatic: Abdominal aorta is normal caliber. No periportal or retroperitoneal adenopathy. No pelvic adenopathy. Reproductive: Post hysterectomy. Post oophorectomy. No pelvic sidewall nodularity. No adnexal nodularity. No peritoneal nodularity. No fluid within the abdomen pelvis. Other: Low-attenuation lesion in the retroperitoneum LEFT of the aortic bifurcation is decreased in size measuring 4 mm (image 50/series 9) compared to 13 mm on prior. Musculoskeletal: No aggressive osseous lesion. IMPRESSION: 1. No evidence of ovarian cancer recurrence or metastasis. 2. Interval decrease in size of low-attenuation lesion in the retroperitoneum LEFT of the aortic bifurcation. 3. Stable polypoid lesion in the gallbladder. Electronically Signed   By: Genevive Bi M.D.   On: 09/18/2022 08:24      10/25/2022 Imaging   MR Brain Wo Contrast  Result Date: 11/02/2022 CLINICAL DATA:  Provided history: Left temporal headache. New onset of headaches after age 8. Headache, new onset. EXAM: MRI HEAD WITHOUT CONTRAST TECHNIQUE: Multiplanar, multiecho pulse sequences of the brain and surrounding structures were obtained without intravenous contrast. COMPARISON:  No pertinent prior exams available for comparison. FINDINGS: Brain: Cerebral volume is normal. No cortical  encephalomalacia is identified. No significant cerebral white matter disease. There is no acute infarct. No evidence of an intracranial mass. No chronic intracranial blood products. No extra-axial fluid collection. No midline shift. Vascular: Maintained flow voids within the proximal large arterial vessels. Skull and upper cervical spine: No focal suspicious marrow lesion. Sinuses/Orbits: No mass or acute finding within the imaged orbits. Minimal mucosal thickening versus small mucous retention cyst within the right maxillary sinus. Other: 11 mm ovoid predominantly T2 hyperintense focus within the superficial lobe of the right parotid gland (for instance as seen on series 12, image 3). Impression #2 will be called to the ordering clinician or representative by the Radiologist Assistant, and communication documented in the PACS or [employer]. IMPRESSION: 1. Unremarkable non-contrast MRI appearance of the brain. No evidence of an acute intracranial abnormality. 2. 11 mm ovoid predominantly T2 hyperintense focus within the superficial lobe of the right parotid gland. This may reflect a primary parotid neoplasm, cyst or enlarged/abnormal intraparotid lymph node. ENT  referral recommended. 3. Minimal mucosal thickening or small mucous retention cyst within the right maxillary sinus. Electronically Signed   By: Jackey Loge D.O.   On: 11/02/2022 17:34   CT CHEST ABDOMEN PELVIS W CONTRAST  Result Date: 10/25/2022 CLINICAL DATA:  Severe chest, abdominal and back pain beginning last night. Metastatic ovarian carcinoma. * Tracking Code: BO * EXAM: CT CHEST, ABDOMEN, AND PELVIS WITH CONTRAST TECHNIQUE: Multidetector CT imaging of the chest, abdomen and pelvis was performed following the standard protocol during bolus administration of intravenous contrast. RADIATION DOSE REDUCTION: This exam was performed according to the departmental dose-optimization program which includes automated exposure control, adjustment of  the mA and/or kV according to patient size and/or use of iterative reconstruction technique. CONTRAST:  75mL OMNIPAQUE IOHEXOL 350 MG/ML SOLN COMPARISON:  AP only CT on 09/16/2022 FINDINGS: CT CHEST FINDINGS Cardiovascular: No acute findings. Mediastinum/Lymph Nodes: No masses or pathologically enlarged lymph nodes identified. Lungs/Pleura: 2 tiny 3 mm pulmonary nodules are seen in the posterior right lower lobe on images 68 and 71 of series 5, which are nonspecific. No evidence of infiltrate or pleural effusion. Musculoskeletal:  No suspicious bone lesions identified. CT ABDOMEN AND PELVIS FINDINGS Hepatobiliary: No masses identified. Stable 7 mm polypoid lesion in the gallbladder. No evidence of cholecystitis or biliary ductal dilatation. Pancreas:  No mass or inflammatory changes. Spleen:  Within normal limits in size and appearance. Adrenals/Urinary tract: No suspicious masses or hydronephrosis. Stomach/Bowel: The appendix is enlarged, measuring 14 mm, which is new findings since previous study. Mild periappendiceal inflammatory changes are seen and several small appendicoliths are also noted. No evidence of perforation or abscess. No evidence of bowel obstruction. Vascular/Lymphatic: No pathologically enlarged lymph nodes identified. No acute vascular findings. Aortic atherosclerotic calcification incidentally noted. Reproductive: Prior hysterectomy noted. Adnexal regions are unremarkable in appearance. Other:  None. Musculoskeletal:  No suspicious bone lesions identified. IMPRESSION: Positive for acute appendicitis. No evidence of perforation or abscess. No definite evidence of recurrent or metastatic carcinoma. Two 3 mm indeterminate pulmonary nodules in right lower lobe. Recommend continued follow-up by chest CT in 3 months. Stable 7 mm gallbladder polyp. Recommend continued attention on follow-up imaging. Aortic Atherosclerosis (ICD10-I70.0). Electronically Signed   By: Danae Orleans M.D.   On: 10/25/2022  11:27      12/21/2022 Tumor Marker   Patient's tumor was tested for the following markers: CA-125. Results of the tumor marker test revealed 6.3.   03/01/2023 Tumor Marker   Patient's tumor was tested for the following markers: CA-125. Results of the tumor marker test revealed 6.9.   06/22/2023 Tumor Marker   Patient's tumor was tested for the following markers: CA-125. Results of the tumor marker test revealed 5.3.     PHYSICAL EXAMINATION: ECOG PERFORMANCE STATUS: 0 - Asymptomatic  Vitals:   06/24/23 0953  BP: (!) 140/41  Pulse: (!) 58  Resp: 18  Temp: 98 F (36.7 C)  SpO2: 100%   Filed Weights   06/24/23 0953  Weight: 180 lb 9.6 oz (81.9 kg)    GENERAL:alert, no distress and comfortable NEURO: alert & oriented x 3 with fluent speech, no focal motor/sensory deficits  LABORATORY DATA:  I have reviewed the data as listed    Component Value Date/Time   NA 142 06/21/2023 0953   K 4.4 06/21/2023 0953   CL 109 06/21/2023 0953   CO2 28 06/21/2023 0953   GLUCOSE 114 (H) 06/21/2023 0953   BUN 23 (H) 06/21/2023 0953   CREATININE 0.96 06/21/2023  0953   CREATININE 0.97 03/18/2022 1523   CALCIUM 9.7 06/21/2023 0953   PROT 7.2 06/21/2023 0953   ALBUMIN 4.5 06/21/2023 0953   AST 18 06/21/2023 0953   AST 20 03/18/2022 1523   ALT 34 06/21/2023 0953   ALT 26 03/18/2022 1523   ALKPHOS 70 06/21/2023 0953   BILITOT 0.3 06/21/2023 0953   BILITOT 0.6 03/18/2022 1523   GFRNONAA >60 06/21/2023 0953   GFRNONAA >60 03/18/2022 1523    No results found for: "SPEP", "UPEP"  Lab Results  Component Value Date   WBC 5.8 06/21/2023   NEUTROABS 3.8 06/21/2023   HGB 13.3 06/21/2023   HCT 40.7 06/21/2023   MCV 92.7 06/21/2023   PLT 336 06/21/2023      Chemistry      Component Value Date/Time   NA 142 06/21/2023 0953   K 4.4 06/21/2023 0953   CL 109 06/21/2023 0953   CO2 28 06/21/2023 0953   BUN 23 (H) 06/21/2023 0953   CREATININE 0.96 06/21/2023 0953   CREATININE 0.97  03/18/2022 1523      Component Value Date/Time   CALCIUM 9.7 06/21/2023 0953   ALKPHOS 70 06/21/2023 0953   AST 18 06/21/2023 0953   AST 20 03/18/2022 1523   ALT 34 06/21/2023 0953   ALT 26 03/18/2022 1523   BILITOT 0.3 06/21/2023 0953   BILITOT 0.6 03/18/2022 1523

## 2023-06-24 NOTE — Assessment & Plan Note (Signed)
I have reviewed multiple imaging studies with the patient  We reviewed results of her tumor marker She has signs or symptoms to suggest cancer reoccurrence I plan to see her again in 12 months with repeat tumor marker only

## 2023-06-24 NOTE — Assessment & Plan Note (Signed)
I have reviewed multiple imaging studies dated back to 2017 She had gallbladder mass present since then It was described as a polypoid lesion Since it has been stable for over many years, it is not clear the utility of continuous monitoring For future reference, if she wants this to be monitored, I would recommend ultrasound to minimize exposure to radiation I would like the patient to discuss this with her primary care doctor

## 2023-06-25 ENCOUNTER — Telehealth: Payer: Self-pay | Admitting: Hematology and Oncology

## 2023-06-25 NOTE — Telephone Encounter (Signed)
Spoke with patient confirming upcoming appointment

## 2023-07-20 ENCOUNTER — Other Ambulatory Visit: Payer: BC Managed Care – PPO

## 2023-08-27 ENCOUNTER — Ambulatory Visit: Payer: BC Managed Care – PPO | Admitting: Gynecologic Oncology

## 2023-09-07 ENCOUNTER — Ambulatory Visit: Payer: Self-pay

## 2023-09-07 NOTE — Telephone Encounter (Signed)
 Noted.

## 2023-09-07 NOTE — Telephone Encounter (Signed)
 Copied From CRM [id number]. Reason for Triage Patient called this morning requesting to schedule an appointment with her PCP due to a persistent cough that has been ongoing for several days. She reports coughing up clear mucus with no chest pain, fever, or shortness of breath. Patient inquired if her provider could prescribe an antibiotic.  Callback Number: [phone]  Chief Complaint: Productive cough Symptoms: Coughing spells that worsen at night Frequency: A week Pertinent Negatives: Patient denies difficulty breathing Disposition: [] ED /[] Urgent Care (no appt availability in office) / [x] Appointment(In office/virtual)/ []  [facility]/ [] Home Care/ [] Refused Recommended Disposition /[] [facility]/ []  Follow-up with PCP Additional Notes: Patient called in to report a productive cough that has been present for about a week. Patient stated cough produces clear discharge. Patient stated she is having coughing spells that worsen at night. Patient denied fever and difficulty breathing. Advised patient to be seen within 3 days, per protocol.  Scheduled with PCP tomorrow. Provided care advice and instructed patient to call back if symptoms worsen. Patient complied. Patient is requesting to be seen today if there are any cancellations.   Reason for Disposition  [1] Continuous (nonstop) coughing interferes with work or school AND [2] no improvement using cough treatment per Care Advice  Answer Assessment - Initial Assessment Questions 1. ONSET: "When did the cough begin?"      A week 2. SEVERITY: "How bad is the cough today?"      Frequent coughing spells, especially at night 3. SPUTUM: "Describe the color of your sputum" (none, dry cough; clear, white, yellow, green)     Clear 4. HEMOPTYSIS: "Are you coughing up any blood?" If so ask: "How much?" (flecks, streaks, tablespoons, etc.)     Denies 5. DIFFICULTY BREATHING: "Are you having difficulty breathing?" If Yes, ask: "How  bad is it?" (e.g., mild, moderate, severe)    - MILD: No SOB at rest, mild SOB with walking, speaks normally in sentences, can lie down, no retractions, pulse < 100.    - MODERATE: SOB at rest, SOB with minimal exertion and prefers to sit, cannot lie down flat, speaks in phrases, mild retractions, audible wheezing, pulse 100-120.    - SEVERE: Very SOB at rest, speaks in single words, struggling to breathe, sitting hunched forward, retractions, pulse > 120      Denies 6. FEVER: "Do you have a fever?" If Yes, ask: "What is your temperature, how was it measured, and when did it start?"     Denies 7. CARDIAC HISTORY: "Do you have any history of heart disease?" (e.g., heart attack, congestive heart failure)      Denies 8. LUNG HISTORY: "Do you have any history of lung disease?"  (e.g., pulmonary embolus, asthma, emphysema)     Denies 10. OTHER SYMPTOMS: "Do you have any other symptoms?" (e.g., runny nose, wheezing, chest pain)     Denies wheezing, denies additional symptoms at this time  Protocols used: Cough - Acute Productive-A-AH

## 2023-09-08 ENCOUNTER — Ambulatory Visit (INDEPENDENT_AMBULATORY_CARE_PROVIDER_SITE_OTHER): Admitting: Family Medicine

## 2023-09-08 ENCOUNTER — Encounter: Payer: Self-pay | Admitting: Family Medicine

## 2023-09-08 VITALS — BP 120/62 | HR 79 | Temp 98.0°F | Ht 62.5 in | Wt 177.8 lb

## 2023-09-08 DIAGNOSIS — H35362 Drusen (degenerative) of macula, left eye: Secondary | ICD-10-CM | POA: Diagnosis not present

## 2023-09-08 DIAGNOSIS — E785 Hyperlipidemia, unspecified: Secondary | ICD-10-CM | POA: Insufficient documentation

## 2023-09-08 DIAGNOSIS — H1045 Other chronic allergic conjunctivitis: Secondary | ICD-10-CM | POA: Diagnosis not present

## 2023-09-08 DIAGNOSIS — R058 Other specified cough: Secondary | ICD-10-CM | POA: Diagnosis not present

## 2023-09-08 DIAGNOSIS — H04123 Dry eye syndrome of bilateral lacrimal glands: Secondary | ICD-10-CM | POA: Diagnosis not present

## 2023-09-08 DIAGNOSIS — H10812 Pingueculitis, left eye: Secondary | ICD-10-CM | POA: Diagnosis not present

## 2023-09-08 DIAGNOSIS — R7309 Other abnormal glucose: Secondary | ICD-10-CM | POA: Diagnosis not present

## 2023-09-08 LAB — HM DIABETES EYE EXAM

## 2023-09-08 MED ORDER — AZITHROMYCIN 250 MG PO TABS: ORAL_TABLET | ORAL | 0 refills | Status: AC

## 2023-09-08 MED ORDER — BENZONATATE 100 MG PO CAPS: 100.0000 mg | ORAL_CAPSULE | Freq: Three times a day (TID) | ORAL | 0 refills | Status: AC | PRN

## 2023-09-08 NOTE — Progress Notes (Signed)
 Phone [phone] In person visit   Subjective:   Brooke Khan is a 58 y.o. year old very pleasant female patient who presents for/with See problem oriented charting Chief Complaint  Patient presents with   Cough    Pt c/o productive cough that has been going on for over a week with only clear mucous. Worse at night.    Past Medical History-  Patient Active Problem List   Diagnosis Date Noted   Aortic atherosclerosis (HCC) 12/29/2022   Obesity, Class I, BMI 30-34.9 06/23/2022   Gallbladder mass 06/23/2022   Acute back pain 06/19/2021   Mild anxiety 04/07/2021   Genetic testing 01/21/2021   Family history of breast cancer 12/26/2020   Family history of prostate cancer 12/26/2020   Other constipation 12/15/2020   Elevated CA-125 12/03/2020   Ovarian cancer, right (HCC)    Elevated blood pressure reading 02/01/2019   Hypothyroidism, unspecified 10/04/2018   Vitamin D  deficiency 10/04/2018    Medications- reviewed and updated Current Outpatient Medications  Medication Sig Dispense Refill   acetaminophen  (TYLENOL ) 500 MG tablet Take 2 tablets (1,000 mg total) by mouth 4 (four) times daily. 120 tablet 3   amLODipine  (NORVASC ) 5 MG tablet Take 1 tablet (5 mg total) by mouth daily. 90 tablet 3   azithromycin (ZITHROMAX) 250 MG tablet Take 2 tablets on day 1, then 1 tablet daily on days 2 through 5 6 tablet 0   benzonatate (TESSALON PERLES) 100 MG capsule Take 1 capsule (100 mg total) by mouth 3 (three) times daily as needed for cough. 20 capsule 0   calcium  carbonate (TUMS - DOSED IN MG ELEMENTAL CALCIUM ) 500 MG chewable tablet Chew 1 tablet by mouth daily.     Cholecalciferol (VITAMIN D3) 1.25 MG (50000 UT) CAPS Vitamin D3  5,000 iu qd     cromolyn (OPTICROM) 4 % ophthalmic solution INSTILL 1 DROP IN BOTH EYES FOUR TIMES DAILY     fluorometholone (FML) 0.1 % ophthalmic suspension 1 drop 4 (four) times daily.     levothyroxine  (SYNTHROID ) 50 MCG tablet TAKE 1 TABLET BY  MOUTH EVERY DAY 90 tablet 3   rosuvastatin  (CRESTOR ) 5 MG tablet Take 1 tablet (5 mg total) by mouth daily. 90 tablet 3   valsartan  (DIOVAN ) 160 MG tablet Take 1 tablet (160 mg total) by mouth daily. 90 tablet 3   vitamin B-12 (CYANOCOBALAMIN ) 1000 MCG tablet Take 1,000 mcg by mouth in the morning.     No current facility-administered medications for this visit.     Objective:  BP 120/62   Pulse 79   Temp 98 F (36.7 C)   Ht 5' 2.5" (1.588 m)   Wt 177 lb 12.8 oz (80.6 kg)   LMP 05/11/2014   SpO2 97%   BMI 32.00 kg/m  Gen: NAD, resting comfortably Tympanic membrane's normal, no sinus tenderness, pharynx with mild erythema and drainage, nasal turbinates erythematous without drainage CV: RRR no murmurs rubs or gallops Lungs: CTAB no crackles, wheeze, rhonchi Ext: minimal edema Skin: warm, dry     Assessment and Plan    # Productive cough S:easter Sunday on the 20th woke up with sore throat (had recently been on a plane), raspy voice with pgoressive los of 24 hours and starting cough . Cough worse at night. Clear productive sputum. Gets some prolonged daytime coughing spells. No shortness of breath. No wheezing. No fever. Feels run down  -history of walking pneumonia when much younger - no chest pain. Sore throat has  improved other than with cough. Raspy voice has continued. Not getting much sinus pressure or congestion. No chest congestion.  -Nyquil helps some with sleep A/P: 58 year old female with productive cough and significant fatigue for 11 days - no sinus pressure/congestion for obvious sinusitis -no ear infection  - no obvious strep on exam - no obvious pneumonia on lung exam   At 11 days of symptoms and no improvement along with history of walking pneumonia patient would like to trial course of azithromycin-with her cancer history though this has done well lately as well as pulmonary nodules we discussed if no improvement within 10 days getting chest x-ray-she is in  agreement with this plan.-Also regardless we will have imaging in November with CT scan of her lungs for the nodules even if this improves today with treatment -Also sent Tessalon in for cough for daytime use-NyQuil has been helpful in the evening  #hypertension S: medication: amlodipine  5 mg, valsartan  160 mg BP Readings from Last 3 Encounters:  09/08/23 120/62  06/24/23 (!) 140/41  02/26/23 138/64  A/P: stable- continue current medicines   #hypothyroidism S: compliant On thyroid  medication-levothyroxine  50 mcg  Lab Results  Component Value Date   TSH 3.44 01/07/2023    A/P: stable- continue current medicines   #hyperlipidemia #aortic atherosclerosis  S: Medication: Rosuvastatin  5 mg daily Lab Results  Component Value Date   CHOL 108 01/07/2023   HDL 38.20 (L) 01/07/2023   LDLCALC 43 01/07/2023   LDLDIRECT 118.0 03/14/2015   TRIG 131.0 01/07/2023   CHOLHDL 3 01/07/2023   A/P: Well-controlled with LDL under 70-continue current medication Aortic atherosclerosis (presumed stable)- LDL goal ideally <70 - at goal continue current medications  Recommended follow up: Return for as needed for new, worsening, persistent symptoms. Future Appointments  Date Time Provider Department Center  09/17/2023  2:00 PM Suzi Essex, MD [facility] None  11/05/2023 10:00 AM [facility]  06/19/2024  9:30 AM [facility] None  06/27/2024  9:00 AM Almeda Jacobs, MD [facility] None    Lab/Order associations:   ICD-10-CM   1. Productive cough  R05.8       Meds ordered this encounter  Medications   azithromycin (ZITHROMAX) 250 MG tablet    Sig: Take 2 tablets on day 1, then 1 tablet daily on days 2 through 5    Dispense:  6 tablet    Refill:  0   benzonatate (TESSALON PERLES) 100 MG capsule    Sig: Take 1 capsule (100 mg total) by mouth 3 (three) times daily as needed for cough.    Dispense:  20 capsule    Refill:  0    Return precautions advised.  Clarisa Crooked, MD

## 2023-09-08 NOTE — Patient Instructions (Addendum)
 Productive cough for 11 days and not improving- we opted to cover for walking pneumonia with azithromycin though discussed chance this could be viral illness and this will not resolve the issues- if not better within about 10 days which would be close to 3 weeks total lets look at doing chest x-ray- reach out to me by mychart   Recommended follow up: Return for as needed for new, worsening, persistent symptoms. -definitely see us  back for fever, worsening cough, or shortness of breath or wheezing

## 2023-09-13 IMAGING — CT CT ABD-PELV W/ CM
2 of 5 series · 16 of 46 positions shown, 18 images · IV contrast (APPLIED)
Comparison: 11/20/2020.

CLINICAL DATA: Ovarian cancer, assess treatment response.

EXAM:
CT ABDOMEN AND PELVIS WITH CONTRAST
TECHNIQUE: Multidetector CT imaging of the abdomen and pelvis was performed
using the standard protocol following bolus administration of
intravenous contrast.
CONTRAST:  80mL OMNIPAQUE IOHEXOL 350 MG/ML SOLN

[Series 2: axial st · axial · 0.80mm/px · z∈[-789,-369]mm · 13 of 99 slices shown, 15 images]
[im 8/99  soft-tissue]
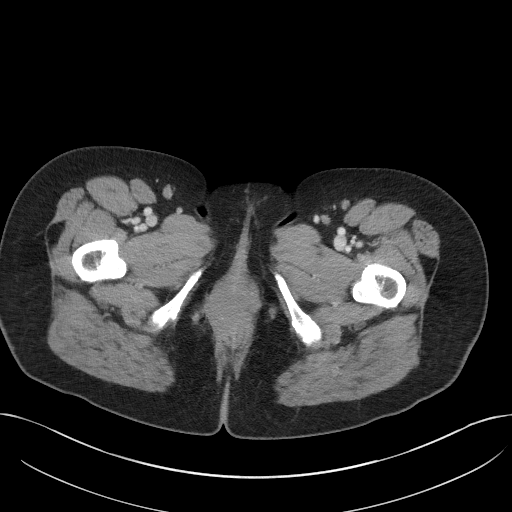
[im 8/99  bone]
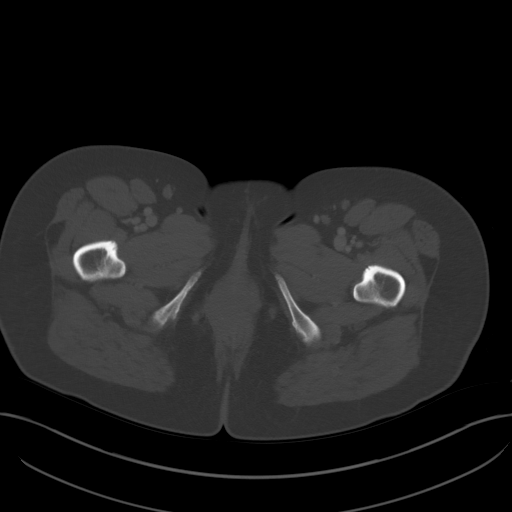
[im 15/99  soft-tissue]
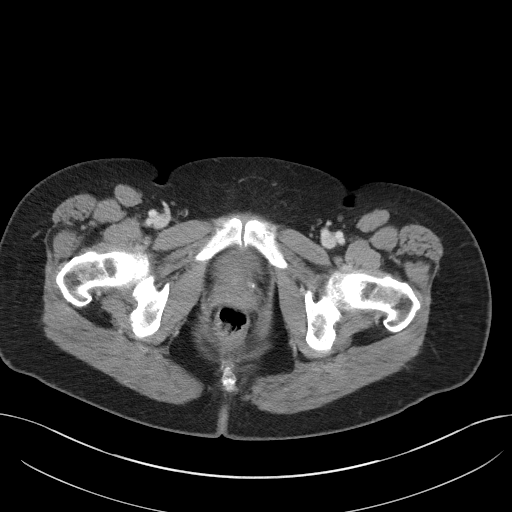
[im 22/99  soft-tissue]
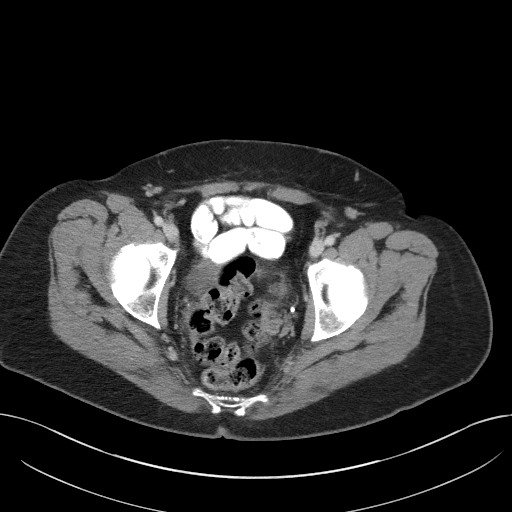
[im 29/99  soft-tissue]
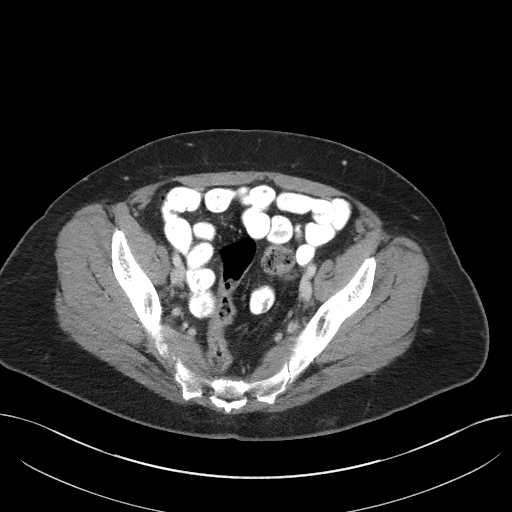
[im 36/99  soft-tissue]
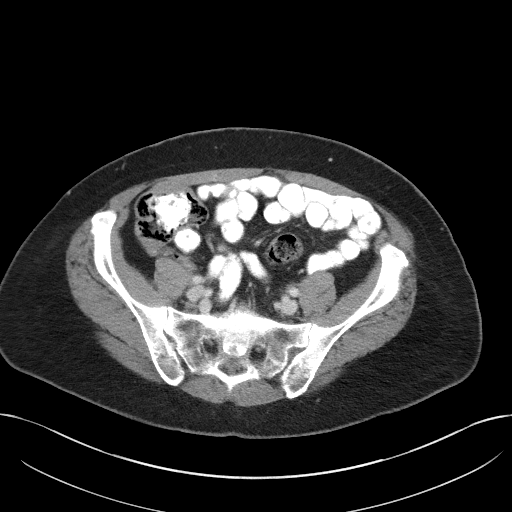
[im 43/99  soft-tissue]
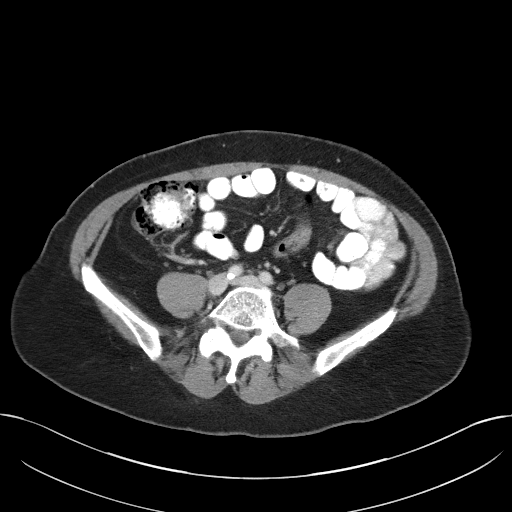
[im 50/99  soft-tissue]
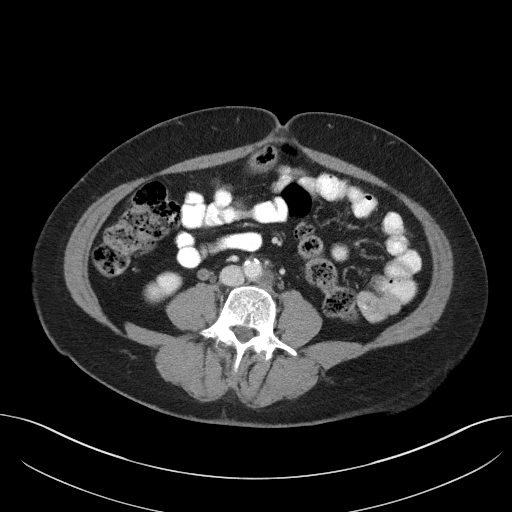
[im 57/99  soft-tissue]
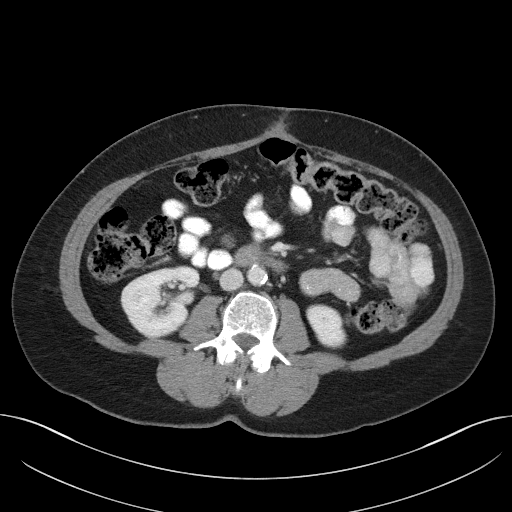
[im 64/99  soft-tissue]
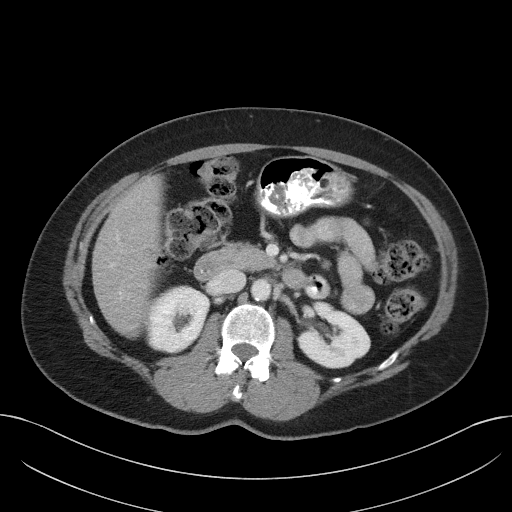
[im 64/99  bone]
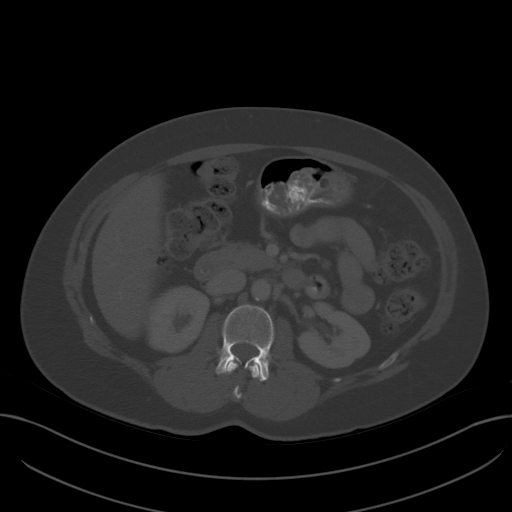
[im 71/99  soft-tissue]
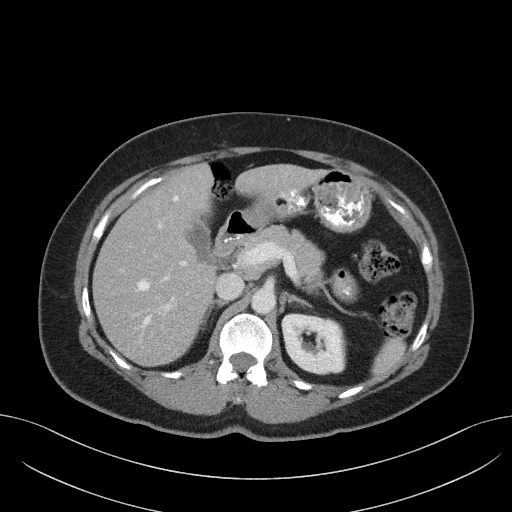
[im 78/99  soft-tissue]
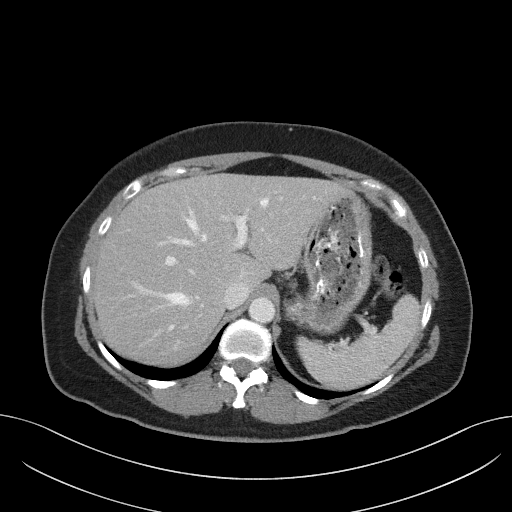
[im 85/99  soft-tissue]
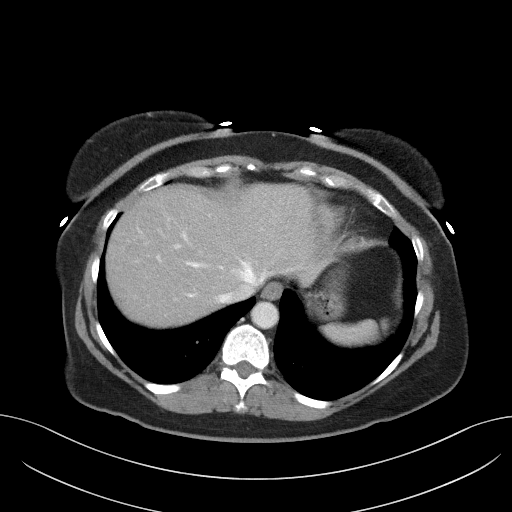
[im 92/99  soft-tissue]
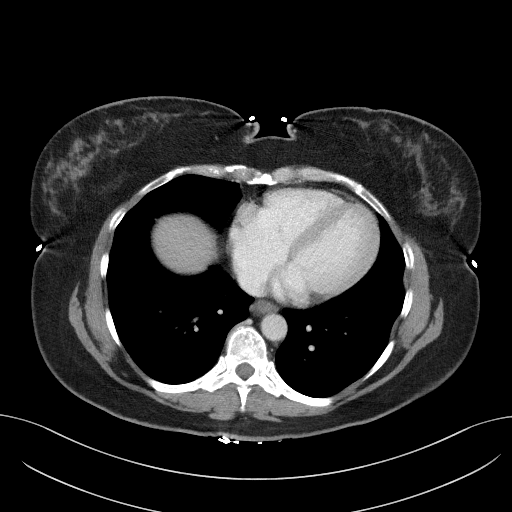

[Series 4: coronal st · coronal · 0.74mm/px · 3 of 101 slices shown]
[im 34/101  soft-tissue]
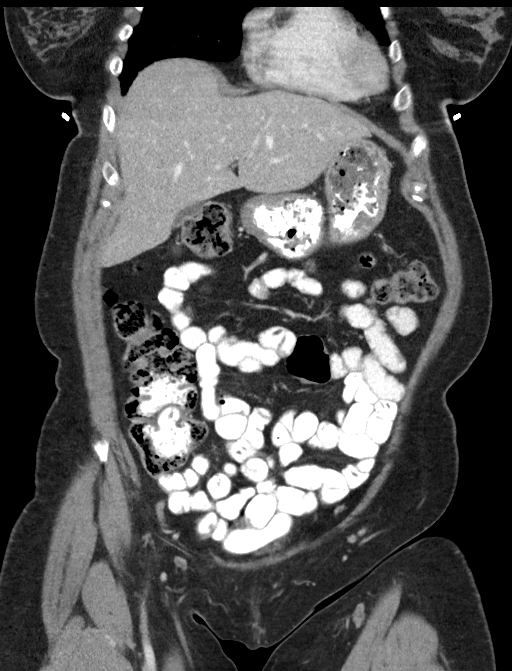
[im 45/101  soft-tissue]
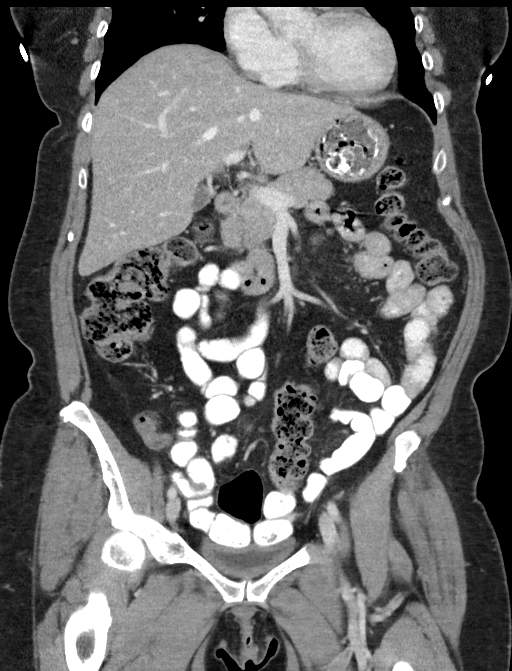
[im 56/101  soft-tissue]
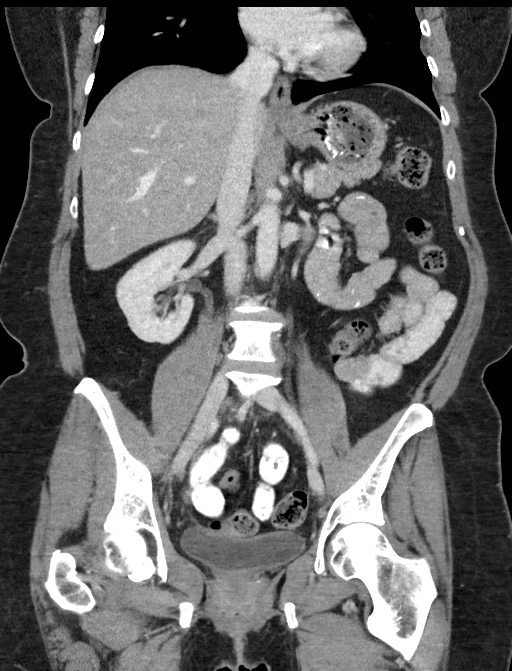

[16 of 46 positions shown; findings below may reference images not displayed]

FINDINGS: Lower chest: Right lower lobe nodules measure up to 4 mm ([DATE]),
unchanged. No new pulmonary nodules. Minimal dependent atelectasis
bilaterally. Heart is at the upper limits of normal in size. No
pericardial or pleural effusion. Distal esophagus is unremarkable.
Distal periesophageal lymph nodes are not enlarged by CT size
criteria.

Hepatobiliary: Liver is decreased in attenuation diffusely but is
otherwise unremarkable. Small stone in the gallbladder. No biliary
ductal dilatation.

Pancreas: Negative.

Spleen: Negative.

Adrenals/Urinary Tract: Adrenal glands and right kidney are
unremarkable. Punctate stone in the left kidney. Left kidney is
otherwise unremarkable. Ureters are decompressed. Bladder is grossly
unremarkable.

Stomach/Bowel: Stomach, small bowel, appendix and colon are
unremarkable.

Vascular/Lymphatic: Atherosclerotic calcification of the aorta. Two
new low-attenuation left periaortic lymph nodes measure up to 1.6 cm
(2/52). Similar 7 mm short axis ileocolic mesenteric lymph node
(2/56) pleural

Reproductive: Hysterectomy. Interval resection of a large right
ovarian mass. No residual or recurrent adnexal Mass.

Other: No free fluid. New nodule at the root of the small bowel
mesentery measures 6 mm (2/36). A nodule in the left lateral omentum
measures 5 mm (2/51), not seen on the prior exam.

Musculoskeletal: Mild degenerative changes in the spine.
IMPRESSION: 1. New left periaortic adenopathy and new left omental and
mesenteric root nodules, indicative of metastatic disease.
2. Right lower lobe nodules are stable.
3. Hepatic steatosis.
4. Cholelithiasis.
5. Left renal stone.
6.  Aortic atherosclerosis ([id number]).

## 2023-09-17 ENCOUNTER — Inpatient Hospital Stay: Attending: Hematology

## 2023-09-17 ENCOUNTER — Inpatient Hospital Stay: Payer: BC Managed Care – PPO | Attending: Hematology | Admitting: Gynecologic Oncology

## 2023-09-17 ENCOUNTER — Encounter: Payer: Self-pay | Admitting: Gynecologic Oncology

## 2023-09-17 VITALS — BP 135/60 | HR 60 | Temp 97.6°F | Resp 16 | Ht 62.5 in | Wt 176.2 lb

## 2023-09-17 DIAGNOSIS — Z90710 Acquired absence of both cervix and uterus: Secondary | ICD-10-CM | POA: Insufficient documentation

## 2023-09-17 DIAGNOSIS — Z8543 Personal history of malignant neoplasm of ovary: Secondary | ICD-10-CM | POA: Diagnosis not present

## 2023-09-17 DIAGNOSIS — Z9221 Personal history of antineoplastic chemotherapy: Secondary | ICD-10-CM | POA: Diagnosis not present

## 2023-09-17 DIAGNOSIS — C561 Malignant neoplasm of right ovary: Secondary | ICD-10-CM

## 2023-09-17 DIAGNOSIS — Z9079 Acquired absence of other genital organ(s): Secondary | ICD-10-CM | POA: Insufficient documentation

## 2023-09-17 DIAGNOSIS — Z90722 Acquired absence of ovaries, bilateral: Secondary | ICD-10-CM | POA: Insufficient documentation

## 2023-09-17 DIAGNOSIS — Z08 Encounter for follow-up examination after completed treatment for malignant neoplasm: Secondary | ICD-10-CM | POA: Diagnosis not present

## 2023-09-17 LAB — CBC WITH DIFFERENTIAL/PLATELET
Abs Immature Granulocytes: 0.02 10*3/uL (ref 0.00–0.07)
Basophils Absolute: 0.1 10*3/uL (ref 0.0–0.1)
Basophils Relative: 1 %
Eosinophils Absolute: 0.2 10*3/uL (ref 0.0–0.5)
Eosinophils Relative: 3 %
HCT: 42.3 % (ref 36.0–46.0)
Hemoglobin: 14 g/dL (ref 12.0–15.0)
Immature Granulocytes: 0 %
Lymphocytes Relative: 34 %
Lymphs Abs: 2.4 10*3/uL (ref 0.7–4.0)
MCH: 29.7 pg (ref 26.0–34.0)
MCHC: 33.1 g/dL (ref 30.0–36.0)
MCV: 89.8 fL (ref 80.0–100.0)
Monocytes Absolute: 0.5 10*3/uL (ref 0.1–1.0)
Monocytes Relative: 7 %
Neutro Abs: 3.9 10*3/uL (ref 1.7–7.7)
Neutrophils Relative %: 55 %
Platelets: 329 10*3/uL (ref 150–400)
RBC: 4.71 MIL/uL (ref 3.87–5.11)
RDW: 13.1 % (ref 11.5–15.5)
WBC: 7.1 10*3/uL (ref 4.0–10.5)
nRBC: 0 % (ref 0.0–0.2)

## 2023-09-17 LAB — COMPREHENSIVE METABOLIC PANEL WITH GFR
ALT: 37 U/L (ref 0–44)
AST: 23 U/L (ref 15–41)
Albumin: 4.6 g/dL (ref 3.5–5.0)
Alkaline Phosphatase: 76 U/L (ref 38–126)
Anion gap: 7 (ref 5–15)
BUN: 17 mg/dL (ref 6–20)
CO2: 29 mmol/L (ref 22–32)
Calcium: 9.7 mg/dL (ref 8.9–10.3)
Chloride: 107 mmol/L (ref 98–111)
Creatinine, Ser: 0.78 mg/dL (ref 0.44–1.00)
GFR, Estimated: >60 mL/min (ref >60)
Glucose, Bld: 95 mg/dL (ref 70–99)
Potassium: 4.1 mmol/L (ref 3.5–5.1)
Sodium: 143 mmol/L (ref 135–145)
Total Bilirubin: 0.3 mg/dL (ref 0.0–1.2)
Total Protein: 7.7 g/dL (ref 6.5–8.1)

## 2023-09-17 NOTE — Patient Instructions (Signed)
 It was good to see you today.  I do not see or feel any evidence of cancer recurrence on your exam.  I will see you for follow-up in 4-5 months.  As always, if you develop any new and concerning symptoms before your next visit, please call to see me sooner.

## 2023-09-17 NOTE — Progress Notes (Signed)
 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 Results of the tumor marker test revealed 62.7.   11/20/2020 Imaging   CT scan of abdomen and pelvis  1. There is a large, cystic mass of the right ovary with peripheral nodular soft tissue components measuring at least 19.8 x 13.3 x 19.8 cm. Findings are consistent with primary ovarian malignancy. 2. Minimal stranding of the omentum in the ventral abdomen and minimal thickening of the peritoneum in the right lower quadrant in the right paracolic gutter without evidence of discrete omental or peritoneal nodularity. Findings are modestly suspicious for early peritoneal metastatic disease. 3. No lymphadenopathy or other evidence of metastatic disease in the abdomen or pelvis. 4. Polyp or adherent gallstone near the gallbladder fundus, better assessed by prior ultrasound. 5. Large burden of stool throughout the colon and rectum.   Aortic Atherosclerosis (ICD10-I70.0).   12/03/2020 Pathology Results   SURGICAL PATHOLOGY  CASE: [id number]  PATIENT: Brooke Khan  Surgical Pathology Report   Clinical History: Ovarian mass (crm)    FINAL MICROSCOPIC DIAGNOSIS:   A. UTERUS, CERVIX, BILATERAL FALLOPIAN TUBES AND OVARIES:  Ovary, right  -  Endometrioid carcinoma, FIGO grade 3  -  See oncology table and comment below   Ovary, left:  -  No carcinoma identified   Bilateral Fallopian tubes:  -  No carcinoma identified   Uterus:  -  No carcinoma identified   Cervix:  -  No carcinoma identified   B. LYMPH NODES, RIGHT PARAAORTIC, RESECTION:  -  No carcinoma identified in six lymph nodes (0/6)   C. LYMPH NODES,  LEFT PARAAORTIC, RESECTION:  -  No carcinoma identified in five lymph nodes (0/5)   D. LYMPH NODES, RIGHT PELVIC, RESECTION:  -  No carcinoma identified in five lymph nodes (0/5)   E. LYMPH NODES, LEFT PELVIC, RESECTION:  -  No carcinoma identified in three lymph nodes (0/3)   F. OMENTUM, OMENTECTOMY:  -  No carcinoma identified   G. PERITONEUM, ANTERIOR PELVIC, BIOPSY:  -  No carcinoma identified   H. PERITONEUM, RIGHT PELVIC, BIOPSY:  -  No carcinoma identified   I. PERITONEUM, LEFT PELVIC, BIOPSY:  -  No carcinoma identified   J. PERITONEUM, POSTERIOR PELVIC, BIOPSY:  -  No carcinoma identified   K. PERITONEUM, RIGHT ABDOMINAL, BIOPSY:  -  No carcinoma identified   L. PERITONEUM, LEFT ABDOMINAL, BIOPSY:  -  No carcinoma identified    ONCOLOGY TABLE:   OVARY or FALLOPIAN TUBE or PRIMARY PERITONEUM: Resection   Procedure: Hysterectomy with bilateral salpingo-oophorectomy  Specimen Integrity: Intact  Tumor Site: Ovary, right  Tumor Size: 1.5 cm  Histologic Type: Endometrioid carcinoma  Histologic Grade: FIGO grade 3  Ovarian Surface Involvement: Not identified  Fallopian Tube Surface Involvement: Not identified  Other Tissue/ Organ Involvement: Not applicable  Largest Extrapelvic Peritoneal Focus: Not applicable  Peritoneal/Ascitic Fluid Involvement: Malignant cells not identified  Chemotherapy Response Score (CRS): Not applicable, no known presurgical therapy  Regional Lymph Nodes:       Number of Nodes with Metastasis Greater than 10 mm: 0       Number of Nodes with Metastasis 10 mm or Less (excludes  isolated  tumor cells): 0       Number of Nodes with Isolated Tumor Cells (0.2 mm or less): 0       Number of Lymph Nodes Examined: 19  Distant Metastasis:       Distant Site(s) Involved: Not applicable  Pathologic Stage Classification (pTNM, AJCC 8th Edition): pT1a, pN0  Ancillary Studies: Can be performed upon request  Representative Tumor Block: A8   Comment(s): Dr. Bernetta Khan reviewed the case and agrees with the above diagnosis.     12/03/2020 Surgery   Surgeon: Brooke Khan    Operation: Robotic-assisted laparoscopic total hysterectomy with bilateral salpingoophorectomy, omentectomy, pelvic and para-aortic lymphadnectomy.     Surgeon: Brooke Khan    Operative Findings:  : 20cm cystic mass, smooth. Attachments between inferior aspect of cystic mass and posterior pelvic peritoneum. Normal appearing omentum and nodes. Invasive malignancy on evaluation. No gross extra-ovarian disease. Clinical stage I disease. No gross residual disease.    12/11/2020 Cancer Staging   Staging form: Ovary, Fallopian Tube, and Primary Peritoneal Carcinoma, AJCC 8th Edition - Pathologic stage from 12/11/2020: FIGO Stage IA (pT1a, pN0, cM0) - Signed by Brooke Jacobs, MD on 12/11/2020 Stage prefix: Initial diagnosis   01/10/2021 Genetic Testing   Negative germline and somatic genetic testing: no pathogenic variants detected in [employer] MyRisk Panel.   No somatic variants detected in [employer] MyChoice HRD testing.  Genomic instaiblity score is negative.  The report dates are January 10, 2021.    The [facility] gene panel offered by [employer] includes sequencing and deletion/duplication testing of the following 48 genes: APC, ATM, AXIN2, BAP1, BARD1, BMPR1A, BRCA1, BRCA2, BRIP1, CHD1, CDK4, CDKN2A(p16 and p14ARF), CHEK2, CTNNA1, EGFR, EPCAM, FH, FLCN, GREM1, HOXB13, MEN1, MET, MITF , MLH1, MSH2, MSH3, MSH6, MUTYH, NTHL1, PALB2, PMS2, POLD1, POLE, PTEN, RAD51C, RAD51D, RET, SDHA, SDHB, SDHC, SDHD, SMAD4, STK11,TERT, TP53, TSC1, TSC2, and VHL.    01/15/2021 - 03/17/2021 Chemotherapy   Patient is on Treatment Plan : OVARIAN Carboplatin  (AUC 6) / Paclitaxel  (175) q21d x 6 cycles     04/15/2021 Tumor Marker   Patient's tumor was tested for the following markers: Ca-125. Results of the tumor marker test revealed 7.7.   04/16/2021 Imaging   1. New  left periaortic adenopathy and new left omental and mesenteric root nodules, indicative of metastatic disease. 2. Right lower lobe nodules are stable. 3. Hepatic steatosis. 4. Cholelithiasis. 5. Left renal stone. 6.  Aortic atherosclerosis (ICD10-I70.0).   06/17/2021 Imaging   1. Small RIGHT retroperitoneal nodule not significant changed. Recommend attention on follow-up. 2. Two low-density para-aortic lymph nodes unchanged from comparison exam. 3. No clear evidence omental or peritoneal metastasis. 4. Stable small pulmonary nodule in the RIGHT lower lobe.     06/19/2021 Tumor Marker   Patient's tumor was tested for the following markers: CA-125. Results of the tumor marker test revealed 6.3.   12/17/2021 Imaging   Stable small low-attenuation retroperitoneal lymph nodes in left para-aortic region. No new or progressive disease within the abdomen or pelvis.   Stable 6 mm polypoid density along the nondependent gallbladder wall consistent with gallbladder polyp. Recommend continued imaging follow-up with ultrasound in 6-12 months.   Stable tiny nonobstructing left renal calculus.   Mild increase in size of midline paraumbilical hernia, which contains only fat.   Aortic Atherosclerosis (ICD10-I70.0).   12/17/2021 Tumor Marker   Patient's tumor was tested for the following markers: CA-125. Results of the tumor marker test revealed 6.8.  03/19/2022 Tumor Marker   Patient's tumor was tested for the following markers: CA-125. Results of the tumor marker test revealed 6.5.   06/22/2022 Tumor Marker   Patient's tumor was tested for the following markers: CA-125. Results of the tumor marker test revealed 6.3.   09/18/2022 Tumor Marker   Patient's tumor was tested for the following markers: CA-125. Results of the tumor marker test revealed 6.3.   09/18/2022 Imaging   CT ABDOMEN PELVIS W CONTRAST  Result Date: 09/18/2022 CLINICAL DATA:  Ovarian carcinoma. Chemotherapy completed November  2022. * Tracking Code: BO * EXAM: CT ABDOMEN AND PELVIS WITH CONTRAST TECHNIQUE: Multidetector CT imaging of the abdomen and pelvis was performed using the standard protocol following bolus administration of intravenous contrast. RADIATION DOSE REDUCTION: This exam was performed according to the departmental dose-optimization program which includes automated exposure control, adjustment of the mA and/or kV according to patient size and/or use of iterative reconstruction technique. CONTRAST:  100mL OMNIPAQUE  IOHEXOL  300 MG/ML  SOLN COMPARISON:  None Available. FINDINGS: Lower chest: Lung bases are clear. Hepatobiliary: No focal hepatic lesion. 6 mm polypoid lesion within the gallbladder not changed from prior (image 27/series 9). No biliary duct dilatation. Common bile duct is normal. Pancreas: Pancreas is normal. No ductal dilatation. No pancreatic inflammation. Spleen: Normal spleen Adrenals/urinary tract: Adrenal glands and kidneys are normal. The ureters and bladder normal. Stomach/Bowel: Stomach, small bowel, appendix, and cecum are normal. The colon and rectosigmoid colon are normal. Vascular/Lymphatic: Abdominal aorta is normal caliber. No periportal or retroperitoneal adenopathy. No pelvic adenopathy. Reproductive: Post hysterectomy. Post oophorectomy. No pelvic sidewall nodularity. No adnexal nodularity. No peritoneal nodularity. No fluid within the abdomen pelvis. Other: Low-attenuation lesion in the retroperitoneum LEFT of the aortic bifurcation is decreased in size measuring 4 mm (image 50/series 9) compared to 13 mm on prior. Musculoskeletal: No aggressive osseous lesion. IMPRESSION: 1. No evidence of ovarian cancer recurrence or metastasis. 2. Interval decrease in size of low-attenuation lesion in the retroperitoneum LEFT of the aortic bifurcation. 3. Stable polypoid lesion in the gallbladder. Electronically Signed   By: Deboraha Fallow M.D.   On: 09/18/2022 08:24      10/25/2022 Imaging   MR Brain  Wo Contrast  Result Date: 11/02/2022 CLINICAL DATA:  Provided history: Left temporal headache. New onset of headaches after age 59. Headache, new onset. EXAM: MRI HEAD WITHOUT CONTRAST TECHNIQUE: Multiplanar, multiecho pulse sequences of the brain and surrounding structures were obtained without intravenous contrast. COMPARISON:  No pertinent prior exams available for comparison. FINDINGS: Brain: Cerebral volume is normal. No cortical encephalomalacia is identified. No significant cerebral white matter disease. There is no acute infarct. No evidence of an intracranial mass. No chronic intracranial blood products. No extra-axial fluid collection. No midline shift. Vascular: Maintained flow voids within the proximal large arterial vessels. Skull and upper cervical spine: No focal suspicious marrow lesion. Sinuses/Orbits: No mass or acute finding within the imaged orbits. Minimal mucosal thickening versus small mucous retention cyst within the right maxillary sinus. Other: 11 mm ovoid predominantly T2 hyperintense focus within the superficial lobe of the right parotid gland (for instance as seen on series 12, image 3). Impression #2 will be called to the ordering clinician or representative by the Radiologist Assistant, and communication documented in the PACS or [employer]. IMPRESSION: 1. Unremarkable non-contrast MRI appearance of the brain. No evidence of an acute intracranial abnormality. 2. 11 mm ovoid predominantly T2 hyperintense focus within the superficial lobe of the right parotid gland. This may  reflect a primary parotid neoplasm, cyst or enlarged/abnormal intraparotid lymph node. ENT referral recommended. 3. Minimal mucosal thickening or small mucous retention cyst within the right maxillary sinus. Electronically Signed   By: Bascom Lily D.O.   On: 11/02/2022 17:34   CT CHEST ABDOMEN PELVIS W CONTRAST  Result Date: 10/25/2022 CLINICAL DATA:  Severe chest, abdominal and back pain beginning last  night. Metastatic ovarian carcinoma. * Tracking Code: BO * EXAM: CT CHEST, ABDOMEN, AND PELVIS WITH CONTRAST TECHNIQUE: Multidetector CT imaging of the chest, abdomen and pelvis was performed following the standard protocol during bolus administration of intravenous contrast. RADIATION DOSE REDUCTION: This exam was performed according to the departmental dose-optimization program which includes automated exposure control, adjustment of the mA and/or kV according to patient size and/or use of iterative reconstruction technique. CONTRAST:  75mL OMNIPAQUE  IOHEXOL  350 MG/ML SOLN COMPARISON:  AP only CT on 09/16/2022 FINDINGS: CT CHEST FINDINGS Cardiovascular: No acute findings. Mediastinum/Lymph Nodes: No masses or pathologically enlarged lymph nodes identified. Lungs/Pleura: 2 tiny 3 mm pulmonary nodules are seen in the posterior right lower lobe on images 68 and 71 of series 5, which are nonspecific. No evidence of infiltrate or pleural effusion. Musculoskeletal:  No suspicious bone lesions identified. CT ABDOMEN AND PELVIS FINDINGS Hepatobiliary: No masses identified. Stable 7 mm polypoid lesion in the gallbladder. No evidence of cholecystitis or biliary ductal dilatation. Pancreas:  No mass or inflammatory changes. Spleen:  Within normal limits in size and appearance. Adrenals/Urinary tract: No suspicious masses or hydronephrosis. Stomach/Bowel: The appendix is enlarged, measuring 14 mm, which is new findings since previous study. Mild periappendiceal inflammatory changes are seen and several small appendicoliths are also noted. No evidence of perforation or abscess. No evidence of bowel obstruction. Vascular/Lymphatic: No pathologically enlarged lymph nodes identified. No acute vascular findings. Aortic atherosclerotic calcification incidentally noted. Reproductive: Prior hysterectomy noted. Adnexal regions are unremarkable in appearance. Other:  None. Musculoskeletal:  No suspicious bone lesions identified.  IMPRESSION: Positive for acute appendicitis. No evidence of perforation or abscess. No definite evidence of recurrent or metastatic carcinoma. Two 3 mm indeterminate pulmonary nodules in right lower lobe. Recommend continued follow-up by chest CT in 3 months. Stable 7 mm gallbladder polyp. Recommend continued attention on follow-up imaging. Aortic Atherosclerosis (ICD10-I70.0). Electronically Signed   By: Marlyce Sine M.D.   On: 10/25/2022 11:27      12/21/2022 Tumor Marker   Patient's tumor was tested for the following markers: CA-125. Results of the tumor marker test revealed 6.3.   03/01/2023 Tumor Marker   Patient's tumor was tested for the following markers: CA-125. Results of the tumor marker test revealed 6.9.   06/22/2023 Tumor Marker   Patient's tumor was tested for the following markers: CA-125. Results of the tumor marker test revealed 5.3.     Interval History: Doing well.  Denies abdominal or pelvic pain.  Reports baseline bowel bladder function.  Recovering from URI.  Past Medical/Surgical History: Past Medical History:  Diagnosis Date   Bradycardia    Chicken pox    Elevated blood pressure reading    Epigastric abdominal pain    Family history of breast cancer 12/26/2020   Family history of prostate cancer 12/26/2020   Heart murmur    since childhood- not always heard   Hyperglycemia    Hyperlipidemia    borderline- no meds   Hypothyroidism (acquired)    Lightheaded    ovarian ca 11/2020   Pre-diabetes    Vitamin D  deficiency  Past Surgical History:  Procedure Laterality Date   CESAREAN SECTION     x2   cosmetic knee surgery     as child   LAPAROSCOPIC APPENDECTOMY N/A 10/25/2022   Procedure: APPENDECTOMY LAPAROSCOPIC;  Surgeon: Anda Bamberg, MD;  Location: [facility] OR;  Service: General;  Laterality: N/A;   LAPAROTOMY N/A 12/03/2020   Procedure: MINI LAPAROTOMY FOR CYST DECOMPRESSION;  Surgeon: Alphonso Aschoff, MD;  Location: [facility] ORS;  Service: Gynecology;   Laterality: N/A;   ROBOTIC ASSISTED TOTAL HYSTERECTOMY WITH BILATERAL SALPINGO OOPHERECTOMY N/A 12/03/2020   Procedure: XI ROBOTIC ASSISTED TOTAL HYSTERECTOMY WITH BILATERAL SALPINGO OOPHORECTOMY WITH PARA-AORTIC AND PELVIC LYMPHADNECTOMY AND OMENTECTOMY;  Surgeon: Alphonso Aschoff, MD;  Location: [facility] ORS;  Service: Gynecology;  Laterality: N/A;    Family History  Problem Relation Age of Onset   Elevated Lipids Mother    Prostate cancer Father        dx 14s   Elevated Lipids Father    Heart disease Father        mid 24s. had been remote smoker.    Hypertension Father    Diabetes Father        mid 66s   Dementia Father        vascular based   Skin cancer Father        SCC, dx after 32   Healthy Brother    Breast cancer Maternal Aunt 72   Prostate cancer Maternal Uncle        dx 28s   Lymphoma Paternal Uncle        dx after 50   Diabetes Maternal Grandmother    Arthritis Maternal Grandfather    Diabetes Paternal Grandfather    Cancer Cousin        unknown type; ?throat ?lymphoma d. 56   Colon cancer Neg Hx    Colon polyps Neg Hx    Esophageal cancer Neg Hx    Rectal cancer Neg Hx    Stomach cancer Neg Hx     Social History   Socioeconomic History   Marital status: Married    Spouse name: Not on file   Number of children: 2   Years of education: Not on file   Highest education level: Not on file  Occupational History   Occupation: realtor  Tobacco Use   Smoking status: Never   Smokeless tobacco: Never  Vaping Use   Vaping status: Never Used  Substance and Sexual Activity   Alcohol use: Yes    Comment: once a month   Drug use: No   Sexual activity: Yes    Partners: Male    Comment: husband  Other Topics Concern   Not on file  Social History Narrative   Married 30 years October 2020. Daughter went to [place]. Son went to [place]. 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 Has a Water engineer in [place].       Hobbies: tennis, plays mahjong in womens group      Social Drivers of Health   Financial Resource Strain: Not on file  Food Insecurity: Low Risk  (12/03/2022)   Received from [facility]   Hunger Vital Sign    Worried About Running Out of Food in the Last Year: Never true    Ran Out of Food in the Last Year: Never true  Transportation Needs: Not on file (12/03/2022)  Physical Activity:  Not on file  Stress: Not on file  Social Connections: Not on file    Current Medications:  Current Outpatient Medications:    acetaminophen  (TYLENOL ) 500 MG tablet, Take 2 tablets (1,000 mg total) by mouth 4 (four) times daily., Disp: 120 tablet, Rfl: 3   amLODipine  (NORVASC ) 5 MG tablet, Take 1 tablet (5 mg total) by mouth daily., Disp: 90 tablet, Rfl: 3   calcium  carbonate (TUMS - DOSED IN MG ELEMENTAL CALCIUM ) 500 MG chewable tablet, Chew 1 tablet by mouth daily., Disp: , Rfl:    Cholecalciferol (VITAMIN D3) 1.25 MG (50000 UT) CAPS, Vitamin D3  5,000 iu qd, Disp: , Rfl:    cromolyn (OPTICROM) 4 % ophthalmic solution, INSTILL 1 DROP IN BOTH EYES FOUR TIMES DAILY, Disp: , Rfl:    fluorometholone (FML) 0.1 % ophthalmic suspension, 1 drop 4 (four) times daily., Disp: , Rfl:    levothyroxine  (SYNTHROID ) 50 MCG tablet, TAKE 1 TABLET BY MOUTH EVERY DAY, Disp: 90 tablet, Rfl: 3   rosuvastatin  (CRESTOR ) 5 MG tablet, Take 1 tablet (5 mg total) by mouth daily., Disp: 90 tablet, Rfl: 3   valsartan  (DIOVAN ) 160 MG tablet, Take 1 tablet (160 mg total) by mouth daily., Disp: 90 tablet, Rfl: 3   vitamin B-12 (CYANOCOBALAMIN ) 1000 MCG tablet, Take 1,000 mcg by mouth in the morning., Disp: , Rfl:    benzonatate  (TESSALON  PERLES) 100 MG capsule, Take 1 capsule (100 mg total) by mouth 3 (three) times daily as needed for cough., Disp: 20 capsule, Rfl: 0  Review of Systems: Denies appetite changes, fevers, chills, fatigue, unexplained weight changes. Denies hearing loss, neck lumps  or masses, mouth sores, ringing in ears or voice changes. Denies wheezing.  Denies shortness of breath. Denies chest pain or palpitations. Denies leg swelling. Denies abdominal distention, pain, blood in stools, constipation, diarrhea, nausea, vomiting, or early satiety. Denies pain with intercourse, dysuria, frequency, hematuria or incontinence. Denies hot flashes, pelvic pain, vaginal bleeding or vaginal discharge.   Denies joint pain, back pain or muscle pain/cramps. Denies itching, rash, or wounds. Denies dizziness, headaches, numbness or seizures. Denies swollen lymph nodes or glands, denies easy bruising or bleeding. Denies anxiety, depression, confusion, or decreased concentration.  Physical Exam: BP 135/60 (BP Location: Left Arm, Patient Position: Sitting)   Pulse 60   Temp 97.6 F (36.4 C) (Oral)   Resp 16   Ht 5' 2.5" (1.588 m)   Wt 176 lb 3.2 oz (79.9 kg)   LMP 05/11/2014   SpO2 98%   BMI 31.71 kg/m  General: Alert, oriented, no acute distress. HEENT: Normocephalic, atraumatic, sclera anicteric. Chest: Clear to auscultation bilaterally.  No wheezes or rhonchi. Cardiovascular: Regular rate and rhythm, no murmurs. Abdomen: soft, nontender.  Normoactive bowel sounds.  No masses or hepatosplenomegaly appreciated.  Well-healed incisions. Extremities: Grossly normal range of motion.  Warm, well perfused.  No edema bilaterally. Skin: No rashes or lesions noted. Lymphatics: No cervical, supraclavicular, or inguinal adenopathy. GU: Normal appearing external genitalia without erythema, excoriation, or lesions.  Speculum exam reveals mildly atrophic vaginal mucosa, polypoid beefy red <1 cm lesion noted along the cuff in the midline into the right (previously biopsied, unchanged).  No bleeding or discharge.  Bimanual exam reveals cuff intact, lesion described above is palpable although not firm or nodular.  No nodularity or masses.  Rectovaginal exam confirms these  findings.  Laboratory & Radiologic Studies:          Component Ref Range & Units (hover) 2 mo  ago (06/21/23) 6 mo ago (02/25/23) 9 mo ago (12/17/22) 1 yr ago (09/16/22) 1 yr ago (06/19/22) 1 yr ago (03/18/22) 1 yr ago (12/15/21)  Cancer Antigen (CA) 125 5.3 6.9 CM 6.3 CM 6.3 CM 6.3 CM 6.5 CM 6.8       Assessment & Plan: Brooke Khan is a 58 y.o. 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  Completed 4 cycles of adjuvant chemotherapy. Negative genetic testing.   Patient is overall doing well and is NED on exam today.  CA-125 today.   CT follow-up of her chest in 03/2023 showed stable pulmonary nodules, likely benign.  CT chest follow-up in 12 months recommended.   She is approximately 2.5 years out from completion of adjuvant therapy.  I will see her back in 4 months.  The patient sees Dr. Marton Sleeper in February 2026.  We reviewed signs and symptoms that should prompt a phone call before her next scheduled visit.  20 minutes of total time was spent for this patient encounter, including preparation, face-to-face counseling with the patient and coordination of care, and documentation of the encounter.  Wiley Hanger, MD  Division of Gynecologic Oncology  Department of Obstetrics and Gynecology  [facility]

## 2023-09-18 ENCOUNTER — Encounter: Payer: Self-pay | Admitting: Gynecologic Oncology

## 2023-09-18 LAB — CA 125: Cancer Antigen (CA) 125: 8 U/mL (ref 0.0–38.1)

## 2023-11-04 ENCOUNTER — Ambulatory Visit: Payer: BC Managed Care – PPO | Admitting: Gynecologic Oncology

## 2023-11-05 ENCOUNTER — Other Ambulatory Visit

## 2023-11-15 IMAGING — CT CT ABD-PELV W/ CM
2 of 5 series · 16 of 46 positions shown, 18 images · IV contrast (OMNIPAQUE)
Comparison: 04/14/2021

CLINICAL DATA: Ovarian cancer.  Assess response to treatment.

EXAM:
CT ABDOMEN AND PELVIS WITH CONTRAST
TECHNIQUE: Multidetector CT imaging of the abdomen and pelvis was performed
using the standard protocol following bolus administration of
intravenous contrast.

[Series 2: axial st · axial · 0.72mm/px · z∈[-414,+1]mm · 13 of 97 slices shown, 15 images]
[im 7/97  soft-tissue]
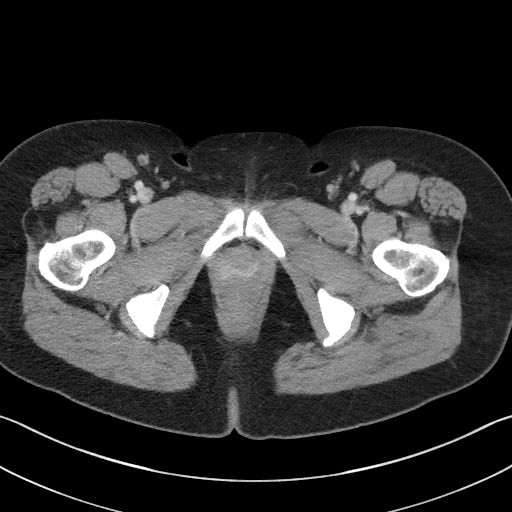
[im 7/97  bone]
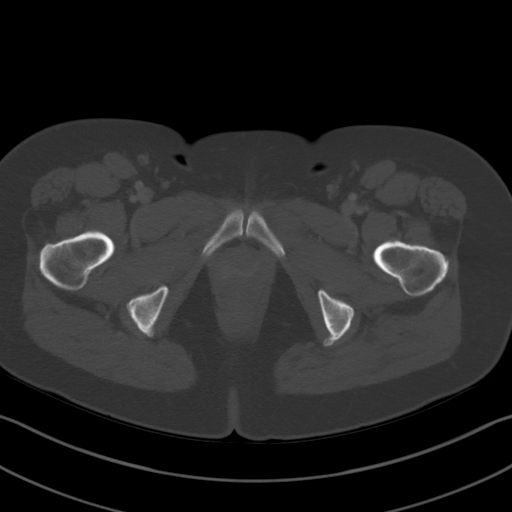
[im 13/97  soft-tissue]
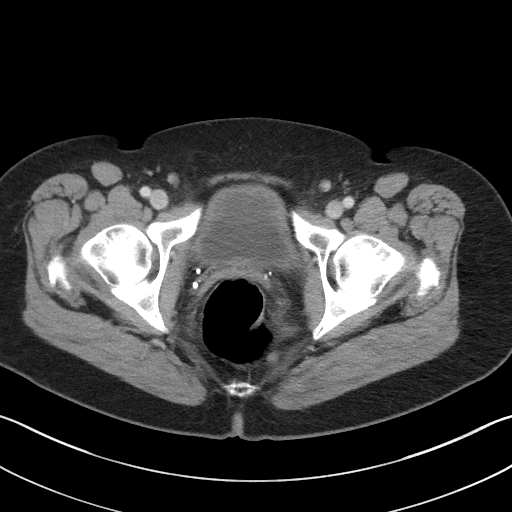
[im 20/97  soft-tissue]
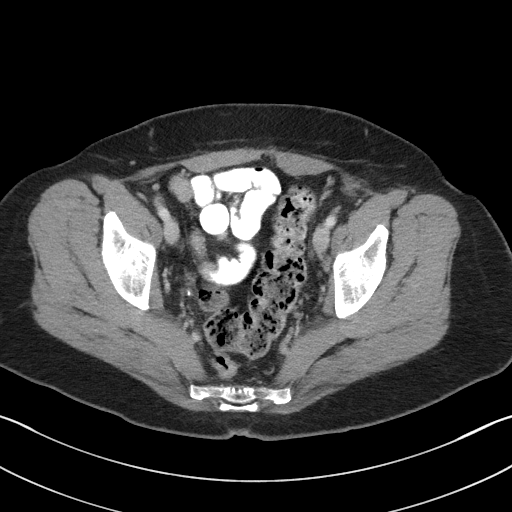
[im 26/97  soft-tissue]
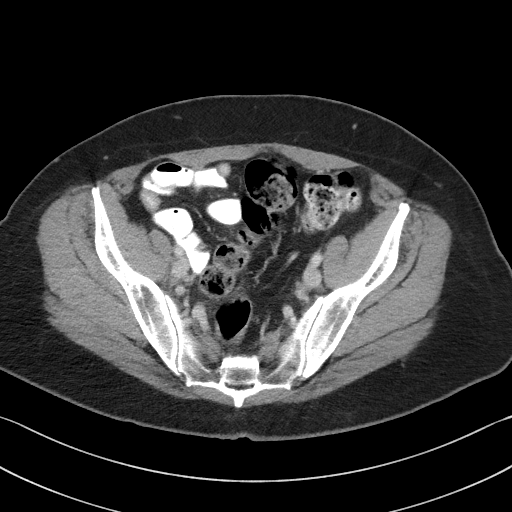
[im 33/97  soft-tissue]
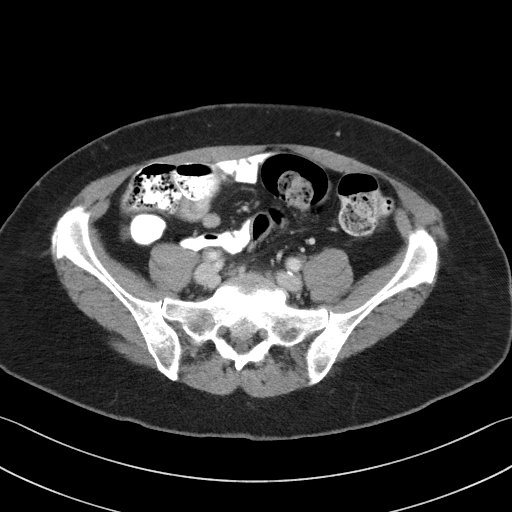
[im 39/97  soft-tissue]
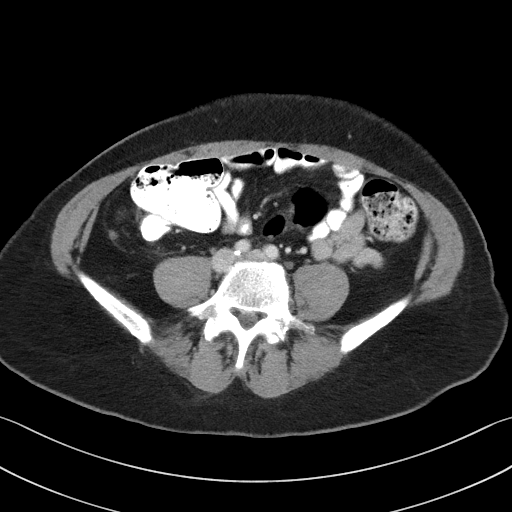
[im 52/97  soft-tissue]
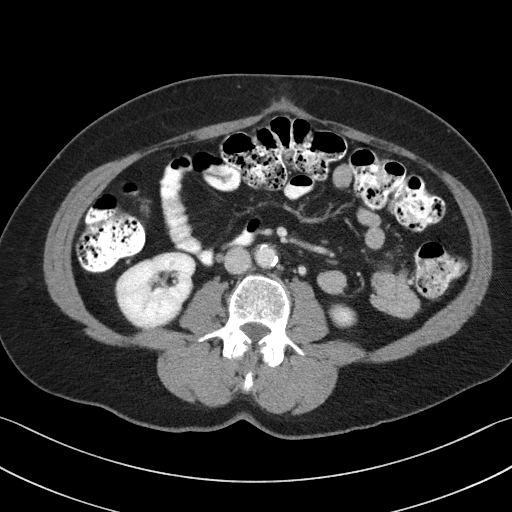
[im 58/97  soft-tissue]
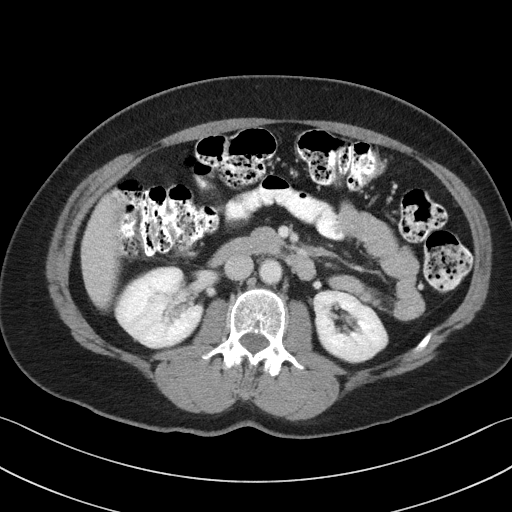
[im 65/97  soft-tissue]
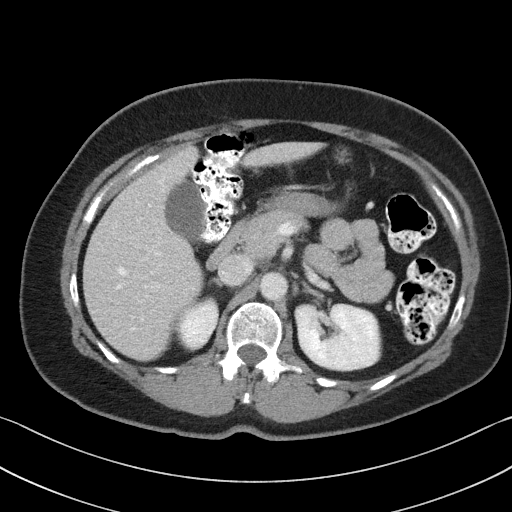
[im 65/97  bone]
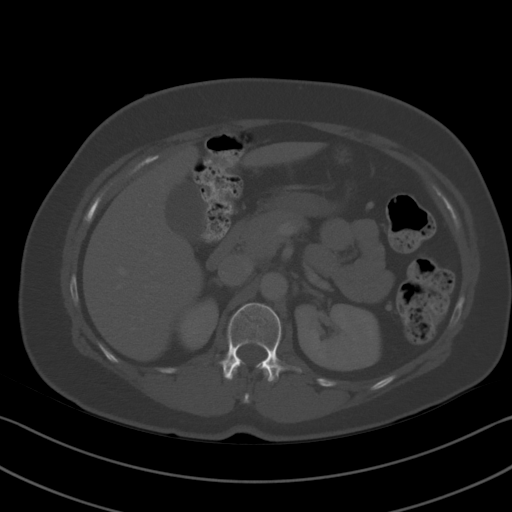
[im 71/97  soft-tissue]
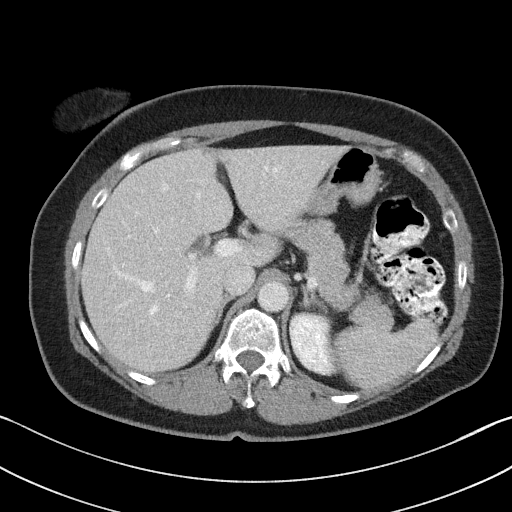
[im 77/97  soft-tissue]
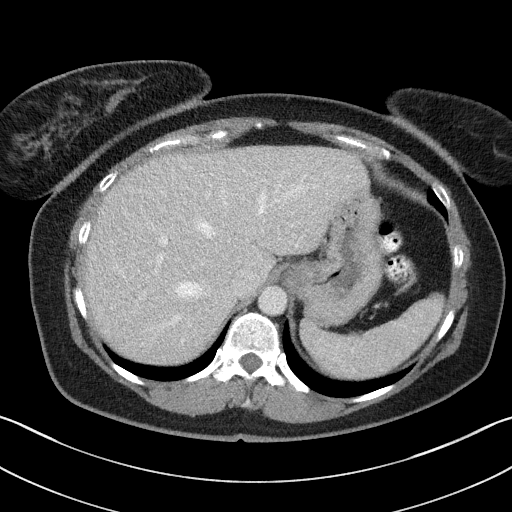
[im 84/97  soft-tissue]
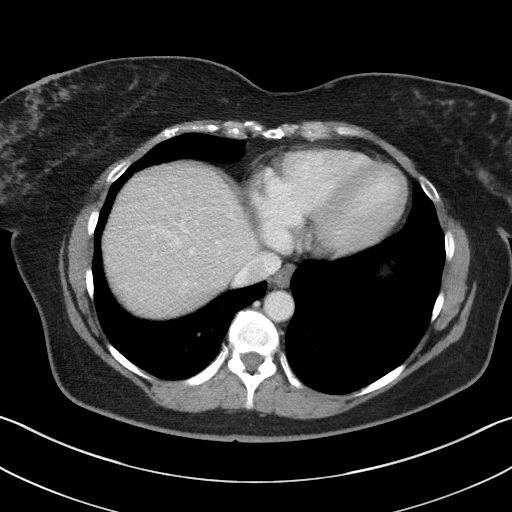
[im 90/97  soft-tissue]
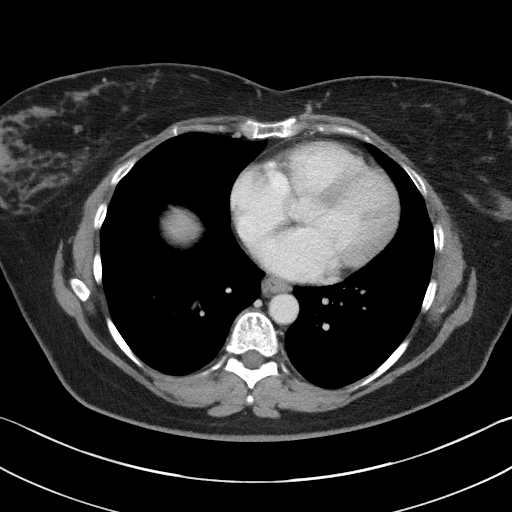

[Series 4: coronal st · coronal · 0.77mm/px · 3 of 90 slices shown]
[im 30/90  soft-tissue]
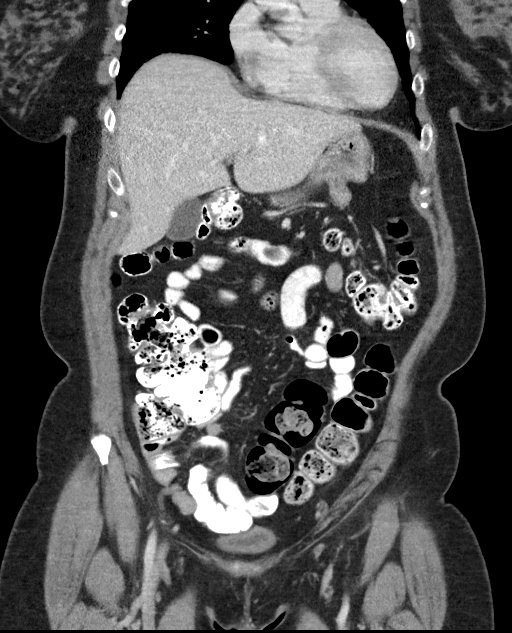
[im 40/90  soft-tissue]
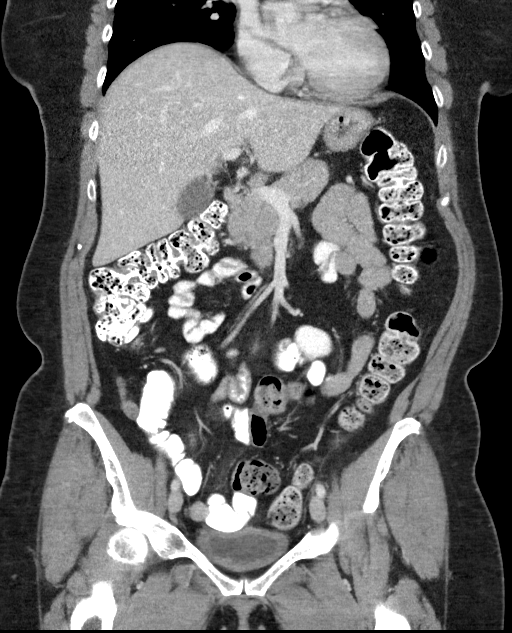
[im 50/90  soft-tissue]
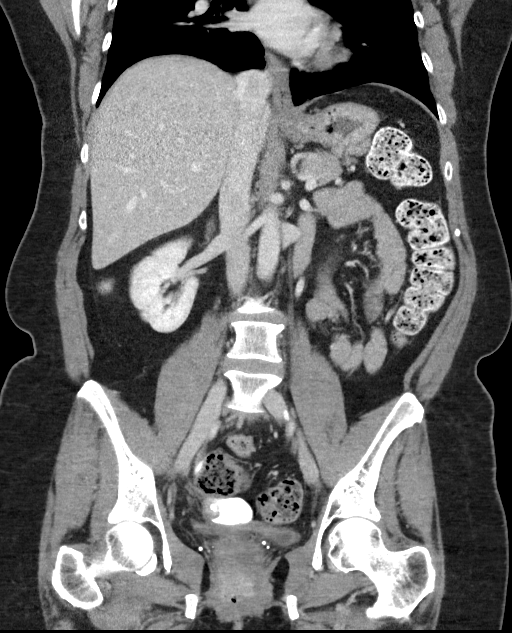

[16 of 46 positions shown; findings below may reference images not displayed]

RADIATION DOSE REDUCTION: This exam was performed according to the
departmental dose-optimization program which includes automated
exposure control, adjustment of the mA and/or kV according to
patient size and/or use of iterative reconstruction technique.

CONTRAST:  100mL OMNIPAQUE IOHEXOL 300 MG/ML  SOLN
FINDINGS: Lower chest: 5 mm nodule in the RIGHT lower lobe (image 3/series 6.
Adjacent small 5 mm nodule on image 6. These 2 small adjacent are
present on CT 04/14/2021 and unchanged

Hepatobiliary: Focal infiltration along the falciform ligament. No
biliary duct dilatation. Common bile duct is normal.

Pancreas: Pancreas is normal. No ductal dilatation. No pancreatic
inflammation.

Spleen: Normal spleen

Adrenals/urinary tract: Adrenal glands and kidneys are normal. The
ureters and bladder normal.

Stomach/Bowel: Stomach, small bowel, appendix, and cecum are normal.
The colon and rectosigmoid colon are normal.

Vascular/Lymphatic: Low-density lymph node LEFT aorta measures 13 mm
(51/series 2) not changed from prior just inferior above the
compared to 16 mm. No pelvic lymphadenopathy.

Reproductive: Post hysterectomy and oophorectomy.

Other: No peritoneal metastasis identified. No intraperitoneal
fluid. Small retroperitoneal node on the RIGHT just above the iliac
crest measuring 5 mm (55/2) is not significant changed from 4 mm.

Musculoskeletal: No aggressive osseous lesion
IMPRESSION: 1. Small RIGHT retroperitoneal nodule not significant changed.
Recommend attention on follow-up.
2. Two low-density para-aortic lymph nodes unchanged from comparison
exam.
3. No clear evidence omental or peritoneal metastasis.
4. Stable small pulmonary nodule in the RIGHT lower lobe.

## 2023-11-16 ENCOUNTER — Other Ambulatory Visit

## 2023-11-16 ENCOUNTER — Ambulatory Visit: Payer: Self-pay | Admitting: Family Medicine

## 2023-11-16 DIAGNOSIS — E039 Hypothyroidism, unspecified: Secondary | ICD-10-CM

## 2023-11-16 LAB — TSH: TSH: 1.76 u[IU]/mL (ref 0.35–5.50)

## 2023-11-17 ENCOUNTER — Other Ambulatory Visit: Payer: Self-pay | Admitting: Family Medicine

## 2024-01-25 ENCOUNTER — Telehealth: Payer: Self-pay | Admitting: *Deleted

## 2024-01-25 NOTE — Telephone Encounter (Signed)
 Spoke with Brooke Khan who called to change her lab appt. From Friday before her appt. With Dr. Viktoria to Thursday, 9/18 at 1100. Pt states she has a prior commitment and would be more convenient to have her labs drawn the day before.

## 2024-01-26 ENCOUNTER — Other Ambulatory Visit: Payer: Self-pay | Admitting: Gynecologic Oncology

## 2024-01-26 DIAGNOSIS — C561 Malignant neoplasm of right ovary: Secondary | ICD-10-CM

## 2024-01-27 ENCOUNTER — Inpatient Hospital Stay: Attending: Hematology and Oncology

## 2024-01-27 DIAGNOSIS — R971 Elevated cancer antigen 125 [CA 125]: Secondary | ICD-10-CM | POA: Diagnosis not present

## 2024-01-27 DIAGNOSIS — Z9079 Acquired absence of other genital organ(s): Secondary | ICD-10-CM | POA: Diagnosis not present

## 2024-01-27 DIAGNOSIS — R918 Other nonspecific abnormal finding of lung field: Secondary | ICD-10-CM | POA: Diagnosis not present

## 2024-01-27 DIAGNOSIS — Z90710 Acquired absence of both cervix and uterus: Secondary | ICD-10-CM | POA: Diagnosis not present

## 2024-01-27 DIAGNOSIS — Z8543 Personal history of malignant neoplasm of ovary: Secondary | ICD-10-CM | POA: Diagnosis present

## 2024-01-27 DIAGNOSIS — Z9221 Personal history of antineoplastic chemotherapy: Secondary | ICD-10-CM | POA: Insufficient documentation

## 2024-01-27 DIAGNOSIS — C561 Malignant neoplasm of right ovary: Secondary | ICD-10-CM

## 2024-01-27 DIAGNOSIS — Z08 Encounter for follow-up examination after completed treatment for malignant neoplasm: Secondary | ICD-10-CM | POA: Insufficient documentation

## 2024-01-27 DIAGNOSIS — Z90722 Acquired absence of ovaries, bilateral: Secondary | ICD-10-CM | POA: Diagnosis not present

## 2024-01-27 LAB — CBC WITH DIFFERENTIAL/PLATELET
Abs Immature Granulocytes: 0.01 K/uL (ref 0.00–0.07)
Basophils Absolute: 0 K/uL (ref 0.0–0.1)
Basophils Relative: 1 %
Eosinophils Absolute: 0.1 K/uL (ref 0.0–0.5)
Eosinophils Relative: 2 %
HCT: 41.3 % (ref 36.0–46.0)
Hemoglobin: 14 g/dL (ref 12.0–15.0)
Immature Granulocytes: 0 %
Lymphocytes Relative: 35 %
Lymphs Abs: 1.8 K/uL (ref 0.7–4.0)
MCH: 30.4 pg (ref 26.0–34.0)
MCHC: 33.9 g/dL (ref 30.0–36.0)
MCV: 89.6 fL (ref 80.0–100.0)
Monocytes Absolute: 0.4 K/uL (ref 0.1–1.0)
Monocytes Relative: 8 %
Neutro Abs: 2.9 K/uL (ref 1.7–7.7)
Neutrophils Relative %: 54 %
Platelets: 268 K/uL (ref 150–400)
RBC: 4.61 MIL/uL (ref 3.87–5.11)
RDW: 13.2 % (ref 11.5–15.5)
WBC: 5.3 K/uL (ref 4.0–10.5)
nRBC: 0 % (ref 0.0–0.2)

## 2024-01-27 LAB — COMPREHENSIVE METABOLIC PANEL WITH GFR
ALT: 28 U/L (ref 0–44)
AST: 21 U/L (ref 15–41)
Albumin: 4.7 g/dL (ref 3.5–5.0)
Alkaline Phosphatase: 74 U/L (ref 38–126)
Anion gap: 6 (ref 5–15)
BUN: 13 mg/dL (ref 6–20)
CO2: 29 mmol/L (ref 22–32)
Calcium: 10 mg/dL (ref 8.9–10.3)
Chloride: 109 mmol/L (ref 98–111)
Creatinine, Ser: 0.77 mg/dL (ref 0.44–1.00)
GFR, Estimated: >60 mL/min (ref >60)
Glucose, Bld: 112 mg/dL — ABNORMAL HIGH (ref 70–99)
Potassium: 4.5 mmol/L (ref 3.5–5.1)
Sodium: 144 mmol/L (ref 135–145)
Total Bilirubin: 0.5 mg/dL (ref 0.0–1.2)
Total Protein: 7.7 g/dL (ref 6.5–8.1)

## 2024-01-28 ENCOUNTER — Inpatient Hospital Stay

## 2024-01-28 ENCOUNTER — Encounter: Payer: Self-pay | Admitting: Gynecologic Oncology

## 2024-01-28 ENCOUNTER — Other Ambulatory Visit

## 2024-01-28 ENCOUNTER — Inpatient Hospital Stay: Admitting: Gynecologic Oncology

## 2024-01-28 VITALS — BP 136/61 | HR 60 | Temp 98.5°F | Resp 19 | Wt 178.4 lb

## 2024-01-28 DIAGNOSIS — Z08 Encounter for follow-up examination after completed treatment for malignant neoplasm: Secondary | ICD-10-CM

## 2024-01-28 DIAGNOSIS — Z8543 Personal history of malignant neoplasm of ovary: Secondary | ICD-10-CM

## 2024-01-28 DIAGNOSIS — R918 Other nonspecific abnormal finding of lung field: Secondary | ICD-10-CM

## 2024-01-28 DIAGNOSIS — C561 Malignant neoplasm of right ovary: Secondary | ICD-10-CM

## 2024-01-28 LAB — CA 125: Cancer Antigen (CA) 125: 7.5 U/mL (ref 0.0–38.1)

## 2024-01-28 NOTE — Progress Notes (Signed)
 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 Results of the tumor marker test revealed 62.7.   11/20/2020 Imaging   CT scan of abdomen and pelvis  1. There is a large, cystic mass of the right ovary with peripheral nodular soft tissue components measuring at least 19.8 x 13.3 x 19.8 cm. Findings are consistent with primary ovarian malignancy. 2. Minimal stranding of the omentum in the ventral abdomen and minimal thickening of the peritoneum in the right lower quadrant in the right paracolic gutter without evidence of discrete omental or peritoneal nodularity. Findings are modestly suspicious for early peritoneal metastatic disease. 3. No lymphadenopathy or other evidence of metastatic disease in the abdomen or pelvis. 4. Polyp or adherent gallstone near the gallbladder fundus, better assessed by prior ultrasound. 5. Large burden of stool throughout the colon and rectum.   Aortic Atherosclerosis (ICD10-I70.0).   12/03/2020 Pathology Results   SURGICAL PATHOLOGY  CASE: [id number]  PATIENT: Brooke Khan  Surgical Pathology Report   Clinical History: Ovarian mass (crm)    FINAL MICROSCOPIC DIAGNOSIS:   A. UTERUS, CERVIX, BILATERAL FALLOPIAN TUBES AND OVARIES:  Ovary, right  -  Endometrioid carcinoma, FIGO grade 3  -  See oncology table and comment below   Ovary, left:  -  No carcinoma identified   Bilateral Fallopian tubes:  -  No carcinoma identified   Uterus:  -  No carcinoma identified   Cervix:  -  No carcinoma identified   B. LYMPH NODES, RIGHT PARAAORTIC, RESECTION:  -  No carcinoma identified in six lymph nodes (0/6)   C. LYMPH NODES,  LEFT PARAAORTIC, RESECTION:  -  No carcinoma identified in five lymph nodes (0/5)   D. LYMPH NODES, RIGHT PELVIC, RESECTION:  -  No carcinoma identified in five lymph nodes (0/5)   E. LYMPH NODES, LEFT PELVIC, RESECTION:  -  No carcinoma identified in three lymph nodes (0/3)   F. OMENTUM, OMENTECTOMY:  -  No carcinoma identified   G. PERITONEUM, ANTERIOR PELVIC, BIOPSY:  -  No carcinoma identified   H. PERITONEUM, RIGHT PELVIC, BIOPSY:  -  No carcinoma identified   I. PERITONEUM, LEFT PELVIC, BIOPSY:  -  No carcinoma identified   J. PERITONEUM, POSTERIOR PELVIC, BIOPSY:  -  No carcinoma identified   K. PERITONEUM, RIGHT ABDOMINAL, BIOPSY:  -  No carcinoma identified   L. PERITONEUM, LEFT ABDOMINAL, BIOPSY:  -  No carcinoma identified    ONCOLOGY TABLE:   OVARY or FALLOPIAN TUBE or PRIMARY PERITONEUM: Resection   Procedure: Hysterectomy with bilateral salpingo-oophorectomy  Specimen Integrity: Intact  Tumor Site: Ovary, right  Tumor Size: 1.5 cm  Histologic Type: Endometrioid carcinoma  Histologic Grade: FIGO grade 3  Ovarian Surface Involvement: Not identified  Fallopian Tube Surface Involvement: Not identified  Other Tissue/ Organ Involvement: Not applicable  Largest Extrapelvic Peritoneal Focus: Not applicable  Peritoneal/Ascitic Fluid Involvement: Malignant cells not identified  Chemotherapy Response Score (CRS): Not applicable, no known presurgical therapy  Regional Lymph Nodes:       Number of Nodes with Metastasis Greater than 10 mm: 0       Number of Nodes with Metastasis 10 mm or Less (  excludes isolated  tumor cells): 0       Number of Nodes with Isolated Tumor Cells (0.2 mm or less): 0       Number of Lymph Nodes Examined: 19  Distant Metastasis:       Distant Site(s) Involved: Not applicable  Pathologic Stage Classification (pTNM, AJCC 8th Edition): pT1a, pN0  Ancillary Studies: Can be performed upon request  Representative Tumor Block: A8   Comment(s): Dr. Rebbecca reviewed the case and agrees with the above diagnosis.     12/03/2020 Surgery   Surgeon: Eloy Maurilio Fitch    Operation: Robotic-assisted laparoscopic total hysterectomy with bilateral salpingoophorectomy, omentectomy, pelvic and para-aortic lymphadnectomy.     Surgeon: Eloy Maurilio Fitch    Operative Findings:  : 20cm cystic mass, smooth. Attachments between inferior aspect of cystic mass and posterior pelvic peritoneum. Normal appearing omentum and nodes. Invasive malignancy on evaluation. No gross extra-ovarian disease. Clinical stage I disease. No gross residual disease.    12/11/2020 Cancer Staging   Staging form: Ovary, Fallopian Tube, and Primary Peritoneal Carcinoma, AJCC 8th Edition - Pathologic stage from 12/11/2020: FIGO Stage IA (pT1a, pN0, cM0) - Signed by Lonn Hicks, MD on 12/11/2020 Stage prefix: Initial diagnosis   01/10/2021 Genetic Testing   Negative germline and somatic genetic testing: no pathogenic variants detected in [facility] MyRisk Panel.   No somatic variants detected in [employer] MyChoice HRD testing.  Genomic instaiblity score is negative.  The report dates are January 10, 2021.    The [facility] gene panel offered by [employer] includes sequencing and deletion/duplication testing of the following 48 genes: APC, ATM, AXIN2, BAP1, BARD1, BMPR1A, BRCA1, BRCA2, BRIP1, CHD1, CDK4, CDKN2A(p16 and p14ARF), CHEK2, CTNNA1, EGFR, EPCAM, FH, FLCN, GREM1, HOXB13, MEN1, MET, MITF , MLH1, MSH2, MSH3, MSH6, MUTYH, NTHL1, PALB2, PMS2, POLD1, POLE, PTEN, RAD51C, RAD51D, RET, SDHA, SDHB, SDHC, SDHD, SMAD4, STK11,TERT, TP53, TSC1, TSC2, and VHL.    01/15/2021 - 03/17/2021 Chemotherapy   Patient is on Treatment Plan : OVARIAN Carboplatin  (AUC 6) / Paclitaxel  (175) q21d x 6 cycles     04/15/2021 Tumor Marker   Patient's tumor was tested for the following markers: Ca-125. Results of the tumor marker test revealed 7.7.   04/16/2021 Imaging   1. New  left periaortic adenopathy and new left omental and mesenteric root nodules, indicative of metastatic disease. 2. Right lower lobe nodules are stable. 3. Hepatic steatosis. 4. Cholelithiasis. 5. Left renal stone. 6.  Aortic atherosclerosis (ICD10-I70.0).   06/17/2021 Imaging   1. Small RIGHT retroperitoneal nodule not significant changed. Recommend attention on follow-up. 2. Two low-density para-aortic lymph nodes unchanged from comparison exam. 3. No clear evidence omental or peritoneal metastasis. 4. Stable small pulmonary nodule in the RIGHT lower lobe.     06/19/2021 Tumor Marker   Patient's tumor was tested for the following markers: CA-125. Results of the tumor marker test revealed 6.3.   12/17/2021 Imaging   Stable small low-attenuation retroperitoneal lymph nodes in left para-aortic region. No new or progressive disease within the abdomen or pelvis.   Stable 6 mm polypoid density along the nondependent gallbladder wall consistent with gallbladder polyp. Recommend continued imaging follow-up with ultrasound in 6-12 months.   Stable tiny nonobstructing left renal calculus.   Mild increase in size of midline paraumbilical hernia, which contains only fat.   Aortic Atherosclerosis (ICD10-I70.0).   12/17/2021 Tumor Marker   Patient's tumor was tested for the following markers: CA-125. Results of the tumor marker test revealed 6.8.  03/19/2022 Tumor Marker   Patient's tumor was tested for the following markers: CA-125. Results of the tumor marker test revealed 6.5.   06/22/2022 Tumor Marker   Patient's tumor was tested for the following markers: CA-125. Results of the tumor marker test revealed 6.3.   09/18/2022 Tumor Marker   Patient's tumor was tested for the following markers: CA-125. Results of the tumor marker test revealed 6.3.   09/18/2022 Imaging   CT ABDOMEN PELVIS W CONTRAST  Result Date: 09/18/2022 CLINICAL DATA:  Ovarian carcinoma. Chemotherapy completed November  2022. * Tracking Code: BO * EXAM: CT ABDOMEN AND PELVIS WITH CONTRAST TECHNIQUE: Multidetector CT imaging of the abdomen and pelvis was performed using the standard protocol following bolus administration of intravenous contrast. RADIATION DOSE REDUCTION: This exam was performed according to the departmental dose-optimization program which includes automated exposure control, adjustment of the mA and/or kV according to patient size and/or use of iterative reconstruction technique. CONTRAST:  OMNIPAQUE  IOHEXOL  300 MG/ML  SOLN COMPARISON:  None Available. FINDINGS: Lower chest: Lung bases are clear. Hepatobiliary: No focal hepatic lesion. 6 mm polypoid lesion within the gallbladder not changed from prior (image 27/series 9). No biliary duct dilatation. Common bile duct is normal. Pancreas: Pancreas is normal. No ductal dilatation. No pancreatic inflammation. Spleen: Normal spleen Adrenals/urinary tract: Adrenal glands and kidneys are normal. The ureters and bladder normal. Stomach/Bowel: Stomach, small bowel, appendix, and cecum are normal. The colon and rectosigmoid colon are normal. Vascular/Lymphatic: Abdominal aorta is normal caliber. No periportal or retroperitoneal adenopathy. No pelvic adenopathy. Reproductive: Post hysterectomy. Post oophorectomy. No pelvic sidewall nodularity. No adnexal nodularity. No peritoneal nodularity. No fluid within the abdomen pelvis. Other: Low-attenuation lesion in the retroperitoneum LEFT of the aortic bifurcation is decreased in size measuring 4 mm (image 50/series 9) compared to 13 mm on prior. Musculoskeletal: No aggressive osseous lesion. IMPRESSION: 1. No evidence of ovarian cancer recurrence or metastasis. 2. Interval decrease in size of low-attenuation lesion in the retroperitoneum LEFT of the aortic bifurcation. 3. Stable polypoid lesion in the gallbladder. Electronically Signed   By: Jackquline Boxer M.D.   On: 09/18/2022 08:24      10/25/2022 Imaging   MR Brain  Wo Contrast  Result Date: 11/02/2022 CLINICAL DATA:  Provided history: Left temporal headache. New onset of headaches after age 38. Headache, new onset. EXAM: MRI HEAD WITHOUT CONTRAST TECHNIQUE: Multiplanar, multiecho pulse sequences of the brain and surrounding structures were obtained without intravenous contrast. COMPARISON:  No pertinent prior exams available for comparison. FINDINGS: Brain: Cerebral volume is normal. No cortical encephalomalacia is identified. No significant cerebral white matter disease. There is no acute infarct. No evidence of an intracranial mass. No chronic intracranial blood products. No extra-axial fluid collection. No midline shift. Vascular: Maintained flow voids within the proximal large arterial vessels. Skull and upper cervical spine: No focal suspicious marrow lesion. Sinuses/Orbits: No mass or acute finding within the imaged orbits. Minimal mucosal thickening versus small mucous retention cyst within the right maxillary sinus. Other: 11 mm ovoid predominantly T2 hyperintense focus within the superficial lobe of the right parotid gland (for instance as seen on series 12, image 3). Impression #2 will be called to the ordering clinician or representative by the Radiologist Assistant, and communication documented in the PACS or [employer]. IMPRESSION: 1. Unremarkable non-contrast MRI appearance of the brain. No evidence of an acute intracranial abnormality. 2. 11 mm ovoid predominantly T2 hyperintense focus within the superficial lobe of the right parotid gland. This may  reflect a primary parotid neoplasm, cyst or enlarged/abnormal intraparotid lymph node. ENT referral recommended. 3. Minimal mucosal thickening or small mucous retention cyst within the right maxillary sinus. Electronically Signed   By: Rockey Childs D.O.   On: 11/02/2022 17:34   CT CHEST ABDOMEN PELVIS W CONTRAST  Result Date: 10/25/2022 CLINICAL DATA:  Severe chest, abdominal and back pain beginning last  night. Metastatic ovarian carcinoma. * Tracking Code: BO * EXAM: CT CHEST, ABDOMEN, AND PELVIS WITH CONTRAST TECHNIQUE: Multidetector CT imaging of the chest, abdomen and pelvis was performed following the standard protocol during bolus administration of intravenous contrast. RADIATION DOSE REDUCTION: This exam was performed according to the departmental dose-optimization program which includes automated exposure control, adjustment of the mA and/or kV according to patient size and/or use of iterative reconstruction technique. CONTRAST:  75mL OMNIPAQUE  IOHEXOL  350 MG/ML SOLN COMPARISON:  AP only CT on 09/16/2022 FINDINGS: CT CHEST FINDINGS Cardiovascular: No acute findings. Mediastinum/Lymph Nodes: No masses or pathologically enlarged lymph nodes identified. Lungs/Pleura: 2 tiny 3 mm pulmonary nodules are seen in the posterior right lower lobe on images 68 and 71 of series 5, which are nonspecific. No evidence of infiltrate or pleural effusion. Musculoskeletal:  No suspicious bone lesions identified. CT ABDOMEN AND PELVIS FINDINGS Hepatobiliary: No masses identified. Stable 7 mm polypoid lesion in the gallbladder. No evidence of cholecystitis or biliary ductal dilatation. Pancreas:  No mass or inflammatory changes. Spleen:  Within normal limits in size and appearance. Adrenals/Urinary tract: No suspicious masses or hydronephrosis. Stomach/Bowel: The appendix is enlarged, measuring 14 mm, which is new findings since previous study. Mild periappendiceal inflammatory changes are seen and several small appendicoliths are also noted. No evidence of perforation or abscess. No evidence of bowel obstruction. Vascular/Lymphatic: No pathologically enlarged lymph nodes identified. No acute vascular findings. Aortic atherosclerotic calcification incidentally noted. Reproductive: Prior hysterectomy noted. Adnexal regions are unremarkable in appearance. Other:  None. Musculoskeletal:  No suspicious bone lesions identified.  IMPRESSION: Positive for acute appendicitis. No evidence of perforation or abscess. No definite evidence of recurrent or metastatic carcinoma. Two 3 mm indeterminate pulmonary nodules in right lower lobe. Recommend continued follow-up by chest CT in 3 months. Stable 7 mm gallbladder polyp. Recommend continued attention on follow-up imaging. Aortic Atherosclerosis (ICD10-I70.0). Electronically Signed   By: Norleen DELENA Kil M.D.   On: 10/25/2022 11:27      12/21/2022 Tumor Marker   Patient's tumor was tested for the following markers: CA-125. Results of the tumor marker test revealed 6.3.   03/01/2023 Tumor Marker   Patient's tumor was tested for the following markers: CA-125. Results of the tumor marker test revealed 6.9.   06/22/2023 Tumor Marker   Patient's tumor was tested for the following markers: CA-125. Results of the tumor marker test revealed 5.3.     Interval History: Doing well.  Denies any abdominal or pelvic pain.  Reports baseline bowel and bladder function.  Having some issues with sciatica.  Past Medical/Surgical History: Past Medical History:  Diagnosis Date   Bradycardia    Chicken pox    Elevated blood pressure reading    Epigastric abdominal pain    Family history of breast cancer 12/26/2020   Family history of prostate cancer 12/26/2020   Heart murmur    since childhood- not always heard   Hyperglycemia    Hyperlipidemia    borderline- no meds   Hypothyroidism (acquired)    Lightheaded    ovarian ca 11/2020   Pre-diabetes    Vitamin D   deficiency     Past Surgical History:  Procedure Laterality Date   CESAREAN SECTION     x2   cosmetic knee surgery     as child   LAPAROSCOPIC APPENDECTOMY N/A 10/25/2022   Procedure: APPENDECTOMY LAPAROSCOPIC;  Surgeon: Paola Dreama SAILOR, MD;  Location: [facility] OR;  Service: General;  Laterality: N/A;   LAPAROTOMY N/A 12/03/2020   Procedure: MINI LAPAROTOMY FOR CYST DECOMPRESSION;  Surgeon: Eloy Herring, MD;  Location: [facility] ORS;   Service: Gynecology;  Laterality: N/A;   ROBOTIC ASSISTED TOTAL HYSTERECTOMY WITH BILATERAL SALPINGO OOPHERECTOMY N/A 12/03/2020   Procedure: XI ROBOTIC ASSISTED TOTAL HYSTERECTOMY WITH BILATERAL SALPINGO OOPHORECTOMY WITH PARA-AORTIC AND PELVIC LYMPHADNECTOMY AND OMENTECTOMY;  Surgeon: Eloy Herring, MD;  Location: [facility] ORS;  Service: Gynecology;  Laterality: N/A;    Family History  Problem Relation Age of Onset   Elevated Lipids Mother    Prostate cancer Father        dx 38s   Elevated Lipids Father    Heart disease Father        mid 47s. had been remote smoker.    Hypertension Father    Diabetes Father        mid 35s   Dementia Father        vascular based   Skin cancer Father        SCC, dx after 92   Healthy Brother    Breast cancer Maternal Aunt 63   Prostate cancer Maternal Uncle        dx 68s   Lymphoma Paternal Uncle        dx after 45   Diabetes Maternal Grandmother    Arthritis Maternal Grandfather    Diabetes Paternal Grandfather    Cancer Cousin        unknown type; ?throat ?lymphoma d. 44   Colon cancer Neg Hx    Colon polyps Neg Hx    Esophageal cancer Neg Hx    Rectal cancer Neg Hx    Stomach cancer Neg Hx     Social History   Socioeconomic History   Marital status: Married    Spouse name: Not on file   Number of children: 2   Years of education: Not on file   Highest education level: Not on file  Occupational History   Occupation: realtor  Tobacco Use   Smoking status: Never   Smokeless tobacco: Never  Vaping Use   Vaping status: Never Used  Substance and Sexual Activity   Alcohol use: Yes    Comment: once a month   Drug use: No   Sexual activity: Yes    Partners: Male    Comment: husband  Other Topics Concern   Not on file  Social History Narrative   Married 30 years October 2020. Daughter went to [place]. Son went to [place]. In 2020, daughter Chief Technology Officer in [place], son moving to [place] and to work for [employer] in  Education officer, environmental. Has a Technical sales engineer in [place].       Hobbies: tennis, plays mahjong in womens group      Social Drivers of Corporate investment banker Strain: Not on file  Food Insecurity: Low Risk  (12/03/2022)   Received from [facility]   Hunger Vital Sign    Within the past 12 months, you worried that your food would run out before you got money to buy more: Never true    Within the past 12 months,  the food you bought just didn't last and you didn't have money to get more. : Never true  Transportation Needs: Not on file (12/03/2022)  Physical Activity: Not on file  Stress: Not on file  Social Connections: Not on file    Current Medications:  Current Outpatient Medications:    amLODipine  (NORVASC ) 5 MG tablet, Take 1 tablet (5 mg total) by mouth daily., Disp: 90 tablet, Rfl: 3   benzonatate  (TESSALON  PERLES) 100 MG capsule, Take 1 capsule (100 mg total) by mouth 3 (three) times daily as needed for cough., Disp: 20 capsule, Rfl: 0   calcium  carbonate (TUMS - DOSED IN MG ELEMENTAL CALCIUM ) 500 MG chewable tablet, Chew 1 tablet by mouth daily., Disp: , Rfl:    Cholecalciferol (VITAMIN D3) 1.25 MG (50000 UT) CAPS, Vitamin D3  5,000 iu qd, Disp: , Rfl:    cromolyn (OPTICROM) 4 % ophthalmic solution, INSTILL 1 DROP IN BOTH EYES FOUR TIMES DAILY, Disp: , Rfl:    fluorometholone (FML) 0.1 % ophthalmic suspension, 1 drop 4 (four) times daily., Disp: , Rfl:    levothyroxine  (SYNTHROID ) 50 MCG tablet, TAKE 1 TABLET BY MOUTH EVERY DAY, Disp: 90 tablet, Rfl: 3   rosuvastatin  (CRESTOR ) 5 MG tablet, Take 1 tablet (5 mg total) by mouth daily., Disp: 90 tablet, Rfl: 3   valsartan  (DIOVAN ) 160 MG tablet, TAKE 1 TABLET BY MOUTH EVERY DAY, Disp: 90 tablet, Rfl: 3   vitamin B-12 (CYANOCOBALAMIN ) 1000 MCG tablet, Take 1,000 mcg by mouth in the morning., Disp: , Rfl:   Review of Systems: Denies appetite changes, fevers, chills, fatigue, unexplained weight changes. Denies hearing loss, neck lumps  or masses, mouth sores, ringing in ears or voice changes. Denies cough or wheezing.  Denies shortness of breath. Denies chest pain or palpitations. Denies leg swelling. Denies abdominal distention, pain, blood in stools, constipation, diarrhea, nausea, vomiting, or early satiety. Denies pain with intercourse, dysuria, frequency, hematuria or incontinence. Denies hot flashes, pelvic pain, vaginal bleeding or vaginal discharge.   Denies joint pain, back pain or muscle pain/cramps. Denies itching, rash, or wounds. Denies dizziness, headaches, numbness or seizures. Denies swollen lymph nodes or glands, denies easy bruising or bleeding. Denies anxiety, depression, confusion, or decreased concentration.  Physical Exam: BP 136/61 (BP Location: Right Arm, Patient Position: Sitting)   Pulse 60   Temp 98.5 F (36.9 C) (Oral)   Resp 19   Wt 178 lb 6.4 oz (80.9 kg)   LMP 05/11/2014   SpO2 98%   BMI 32.11 kg/m  General: Alert, oriented, no acute distress. HEENT: Normocephalic, atraumatic, sclera anicteric. Chest: Clear to auscultation bilaterally.  No wheezes or rhonchi. Cardiovascular: Regular rate and rhythm, no murmurs. Abdomen: soft, nontender.  Normoactive bowel sounds.  No masses or hepatosplenomegaly appreciated.  Well-healed incisions. Extremities: Grossly normal range of motion.  Warm, well perfused.  No edema bilaterally. Skin: No rashes or lesions noted. Lymphatics: No cervical, supraclavicular, or inguinal adenopathy. GU: Normal appearing external genitalia without erythema, excoriation, or lesions.  Speculum exam reveals mildly atrophic vaginal mucosa, polypoid beefy red <1 cm lesion noted along the cuff in the midline into the right (previously biopsied, unchanged).  No bleeding or discharge.  Bimanual exam reveals cuff intact, lesion described above is palpable although not firm or nodular.  No nodularity or masses.  Rectovaginal exam confirms these findings.  Laboratory &  Radiologic Studies: Component Ref Range & Units (hover) 4 mo ago (09/17/23) 7 mo ago (06/21/23) 11 mo ago (02/25/23) 1 yr  ago (12/17/22) 1 yr ago (09/16/22) 1 yr ago (06/19/22) 1 yr ago (03/18/22)  Cancer Antigen (CA) 125 8.0 5.3 CM 6.9 CM 6.3 CM 6.3 CM 6.3 CM 6.5 C    Assessment & Plan: Brooke Khan is a 58 y.o. 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  Completed 4 cycles of adjuvant chemotherapy. Negative genetic testing.   Patient is overall doing well and is NED on exam today.  CA-125 drawn yesterday.   CT follow-up of her chest in 03/2023 showed stable pulmonary nodules, likely benign.  CT chest follow-up in 12 months recommended (scheduled for 03/2024).   We will continue with follow-up every 3-6 months. The patient sees Dr. Lonn in February 2026. I will see her back in June. We reviewed signs and symptoms that should prompt a phone call before her next scheduled visit.  20 minutes of total time was spent for this patient encounter, including preparation, face-to-face counseling with the patient and coordination of care, and documentation of the encounter.  Comer Dollar, MD  Division of Gynecologic Oncology  Department of Obstetrics and Gynecology  [facility]

## 2024-01-28 NOTE — Patient Instructions (Signed)
 It was good to see you today.  I do not see or feel any evidence of cancer recurrence on your exam.  I will see you for follow-up in June months.  As always, if you develop any new and concerning symptoms before your next visit, please call to see me sooner.

## 2024-01-31 ENCOUNTER — Ambulatory Visit: Payer: Self-pay | Admitting: Gynecologic Oncology

## 2024-02-16 ENCOUNTER — Other Ambulatory Visit: Payer: Self-pay | Admitting: Family Medicine

## 2024-02-16 DIAGNOSIS — E785 Hyperlipidemia, unspecified: Secondary | ICD-10-CM

## 2024-02-16 DIAGNOSIS — Z9221 Personal history of antineoplastic chemotherapy: Secondary | ICD-10-CM

## 2024-02-16 DIAGNOSIS — Z79899 Other long term (current) drug therapy: Secondary | ICD-10-CM

## 2024-03-02 ENCOUNTER — Telehealth: Payer: Self-pay | Admitting: Family Medicine

## 2024-03-02 ENCOUNTER — Ambulatory Visit: Payer: Self-pay

## 2024-03-02 NOTE — Telephone Encounter (Signed)
 Since this is unilateral can be very concerning-lets make sure to connect her with triage.  I think we could work her in at 320 tomorrow if need be after the 3 PM patient but once again triage would help us  get more information to determine whether she needs to get more emergent evaluation

## 2024-03-02 NOTE — Telephone Encounter (Signed)
 Patient connected to triage. Scheduled to see Dr. Katrinka 320 03/03/2024.

## 2024-03-02 NOTE — Telephone Encounter (Signed)
  FYI Only or Action Required?: FYI only for provider.  Patient was last seen in primary care on 09/08/2023 by Katrinka Garnette KIDD, MD.  Called Nurse Triage reporting Tingling.  Symptoms began a week ago.  Interventions attempted: Nothing.  Symptoms are: unchanged.  Triage Disposition: See HCP Within 4 Hours (Or PCP Triage)- appt scheduled for 10/23, patient's symptoms did not quite fit dispo's  Patient/caregiver understands and will follow disposition?: Yes  Reason for Disposition  [1] Tingling (e.g., pins and needles) of the face, arm / hand, or leg / foot on one side of the body AND [2] present now  (Exceptions: Chronic/recurrent symptom lasting > 4 weeks; or from known cause, such as: bumped elbow, carpal tunnel, pinched nerve.)  Answer Assessment - Initial Assessment Questions 1. SYMPTOM: What is the main symptom you are concerned about? (e.g., weakness, numbness)     Tingling to left side of head.  Not involving face at all, more on the top of left side of head 2. ONSET: When did this start? (e.g., minutes, hours, days; while sleeping)     A week ago  4. PATTERN Does this come and go, or has it been constant since it started?  Is it present now?     Comes and goes, but more present than not, 75% of day 5. CARDIAC SYMPTOMS: Have you had any of the following symptoms: chest pain, difficulty breathing, palpitations?     denies 6. NEUROLOGIC SYMPTOMS: Have you had any of the following symptoms: headache, dizziness, vision loss, double vision, changes in speech, unsteady on your feet?     Admits to occasional blurred vision 7. OTHER SYMPTOMS: Do you have any other symptoms?     Nothing that patient can recall  Protocols used: Neurologic Deficit-A-AH

## 2024-03-02 NOTE — Telephone Encounter (Signed)
 Copied from CRM [id number]. Topic: General - Other >> Mar 02, 2024  4:27 PM Brooke Khan wrote: Reason for CRM: Pt has an appt scheduled on 11/4 due to tingling on the left side of her head. Pt stated that she wants to see Dr.Hunter but if he thinks this sounds serious she can schedule with another provider and come in sooner if needed. Pt would like a callback with an update on if she needs to come in sooner than 11/4.

## 2024-03-03 ENCOUNTER — Ambulatory Visit: Admitting: Family Medicine

## 2024-03-03 ENCOUNTER — Encounter: Payer: Self-pay | Admitting: Family Medicine

## 2024-03-03 VITALS — BP 120/58 | HR 58 | Temp 97.9°F | Ht 62.5 in | Wt 175.0 lb

## 2024-03-03 DIAGNOSIS — I10 Essential (primary) hypertension: Secondary | ICD-10-CM

## 2024-03-03 DIAGNOSIS — E039 Hypothyroidism, unspecified: Secondary | ICD-10-CM

## 2024-03-03 DIAGNOSIS — R202 Paresthesia of skin: Secondary | ICD-10-CM | POA: Diagnosis not present

## 2024-03-03 NOTE — Telephone Encounter (Signed)
 Appt today

## 2024-03-03 NOTE — Patient Instructions (Addendum)
 We discussed option essentially of either MRI brain vs monitoring - you wanted to give this somre more time. New or worsening symptoms please let me know or seek care  Recommended follow up: Return for next already scheduled visit or sooner if needed.

## 2024-03-03 NOTE — Progress Notes (Signed)
 Phone [phone] In person visit   Subjective:   Brooke Khan is a 58 y.o. year old very pleasant female patient who presents for/with See problem oriented charting Chief Complaint  Patient presents with   Tingling    Head tingling 50/75 % of the day, times one week  Tingling to left side of head.  Not involving face at all, more on the top of left side of head.  Left eye is really dry too.    Immunizations    Will make an nurse visit appointment for vaccines when she returns from vacation.    Past Medical History-  Patient Active Problem List   Diagnosis Date Noted   Ovarian cancer, right [facility])     Priority: High   Hyperlipemia 09/08/2023    Priority: Medium    Aortic atherosclerosis 12/29/2022    Priority: Medium    Gallbladder mass 06/23/2022    Priority: Medium    Essential hypertension 02/01/2019    Priority: Medium    Hypothyroidism, unspecified 10/04/2018    Priority: Medium    Vitamin D  deficiency 10/04/2018    Priority: Medium    Genetic testing 01/21/2021    Priority: Low   Family history of breast cancer 12/26/2020    Priority: Low   Family history of prostate cancer 12/26/2020    Priority: Low   Other constipation 12/15/2020    Priority: Low   Obesity, Class I, BMI 30-34.9 06/23/2022   Acute back pain 06/19/2021   Mild anxiety 04/07/2021    Medications- reviewed and updated Current Outpatient Medications  Medication Sig Dispense Refill   amLODipine  (NORVASC ) 5 MG tablet Take 1 tablet (5 mg total) by mouth daily. 90 tablet 3   calcium  carbonate (TUMS - DOSED IN MG ELEMENTAL CALCIUM ) 500 MG chewable tablet Chew 1 tablet by mouth daily.     Cholecalciferol (VITAMIN D3) 1.25 MG (50000 UT) CAPS Vitamin D3  5,000 iu qd     levothyroxine  (SYNTHROID ) 50 MCG tablet TAKE 1 TABLET BY MOUTH EVERY DAY 90 tablet 3   rosuvastatin  (CRESTOR ) 5 MG tablet TAKE 1 TABLET BY MOUTH DAILY 90 tablet 3   tetrahydrozoline-zinc (VISINE-AC) 0.05-0.25 % ophthalmic  solution 2 drops 3 (three) times daily as needed.     valsartan  (DIOVAN ) 160 MG tablet TAKE 1 TABLET BY MOUTH EVERY DAY 90 tablet 3   vitamin B-12 (CYANOCOBALAMIN ) 1000 MCG tablet Take 1,000 mcg by mouth in the morning.     benzonatate  (TESSALON  PERLES) 100 MG capsule Take 1 capsule (100 mg total) by mouth 3 (three) times daily as needed for cough. (Patient not taking: Reported on 03/03/2024) 20 capsule 0   cromolyn (OPTICROM) 4 % ophthalmic solution INSTILL 1 DROP IN BOTH EYES FOUR TIMES DAILY (Patient not taking: Reported on 03/03/2024)     fluorometholone (FML) 0.1 % ophthalmic suspension 1 drop 4 (four) times daily. (Patient not taking: Reported on 03/03/2024)     No current facility-administered medications for this visit.     Objective:  BP (!) 120/58 (BP Location: Left Arm, Patient Position: Sitting, Cuff Size: Normal)   Pulse (!) 58   Temp 97.9 F (36.6 C) (Tympanic)   Ht 5' 2.5 (1.588 m)   Wt 175 lb (79.4 kg)   LMP 05/11/2014   SpO2 96%   BMI 31.50 kg/m   CV: RRR  Lungs: nonlabored, normal respiratory rate Abdomen: soft/nondistended  Ext: no edema Skin: warm, dry  Neuro: CN II-XII intact, sensation and reflexes normal throughout, 5/5  muscle strength in bilateral upper and lower extremities. Normal finger to nose. Normal rapid alternating movements. No pronator drift. Normal romberg. Normal gait.       Assessment and Plan   #social update- going to visit [place] where daughter will be getting married - leaving tomorrow   # Paresthesias S:one week of symptoms- notes a tingling on left side of head 50-75% of the day. No involvement of face. More on top left side of head. Left eye has felt dry. No pain.   -similar sensation to when she was on chemotherapy when hair was loosening.  -same distribution as headaches last year but this time not painful and just tingling. Reassuring MRI 11/02/22 -did have parotid biopsy on right that was essentially indeterminate- but has not grown   -no rash in area -symptoms resolve with hat on head or pressure  -history of ovarian cancer with 34 cycles of chemotherapy. Likely benign pulmonary nodules November 2024 with 12 month follow up planned. Has follow up with Dr. Viktoria every 3-6 months with cancer-125  ROS- No facial or extremity weakness. No slurred words or trouble swallowing. no blurry vision or double vision. No other paresthesias. No confusion or word finding difficulties.   A/P: patient with paresthesias isolated to v1 distribution- same area she had headaches when we did MRI last year November 02 2022.  - with her ovarian cancer history discussed small possibility of metastasis and offered MRI of the brain with and without contrast.  This would also allow us  to rule out stroke. 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  Rest of her neuroexam is reassuring-she is going to reach back out if she changes her mind and wants to proceed with MRI but she understands the potential concern about metastasis and stroke even though I do not think that is less likely  #hypertension S: medication: amlodipine  5 mg, valsartan  160 BP Readings from Last 3 Encounters:  03/03/24 (!) 120/58  01/28/24 136/61  09/17/23 135/60  A/P: Blood pressure seems to be trending down with lifestyle changes-reports some weight loss-she would like to see if she continues these efforts if she can reduce her medicine-can discuss at next visit  #hypothyroidism S: compliant On thyroid  medication-levothryxine 50 mcg  Lab Results  Component Value Date   TSH 1.76 11/16/2023    A/P:stable- continue current medicines .  Paresthesias can be related to hypothyroidism but I think low yield to recheck this today especially with isolated distribution at site of prior pain  Recommended  follow up: Return for next already scheduled visit or sooner if needed. Future Appointments  Date Time Provider Department Center  03/20/2024 10:30 AM [facility]  06/19/2024  9:30 AM [facility] None  06/19/2024  1:00 PM Katrinka Garnette KIDD, MD [facility]  06/27/2024  9:00 AM Lonn Hicks, MD [facility] None  10/25/2024 12:45 PM [facility] None  10/27/2024  1:00 PM Viktoria Comer SAUNDERS, MD [facility] None    Lab/Order associations:   ICD-10-CM   1. Paresthesias  R20.2     2. Essential hypertension  I10     3. Hypothyroidism, unspecified type  E03.9       No orders of the defined types were placed in this encounter.   Return precautions advised.  Garnette  Katrinka, MD

## 2024-03-14 ENCOUNTER — Ambulatory Visit: Admitting: Family Medicine

## 2024-03-20 ENCOUNTER — Ambulatory Visit (HOSPITAL_COMMUNITY)
Admission: RE | Admit: 2024-03-20 | Discharge: 2024-03-20 | Disposition: A | Discharge status: Home / Self Care | Source: Ambulatory Visit | Attending: Gynecologic Oncology | Admitting: Gynecologic Oncology

## 2024-03-20 ENCOUNTER — Other Ambulatory Visit: Payer: Self-pay | Admitting: Hematology and Oncology

## 2024-03-20 DIAGNOSIS — R918 Other nonspecific abnormal finding of lung field: Secondary | ICD-10-CM | POA: Insufficient documentation

## 2024-03-20 DIAGNOSIS — C561 Malignant neoplasm of right ovary: Secondary | ICD-10-CM

## 2024-03-20 MED ORDER — SODIUM CHLORIDE (PF) 0.9 % IJ SOLN
INTRAMUSCULAR | Status: AC
  Filled 2024-03-20: qty 50

## 2024-03-20 MED ORDER — IOHEXOL 300 MG/ML  SOLN
75.0000 mL | Freq: Once | INTRAMUSCULAR | Status: AC | PRN
  Administered 2024-03-20: 75 mL via INTRAVENOUS

## 2024-03-22 ENCOUNTER — Ambulatory Visit: Payer: Self-pay | Admitting: Gynecologic Oncology

## 2024-04-12 ENCOUNTER — Other Ambulatory Visit: Payer: Self-pay | Admitting: Family Medicine

## 2024-06-16 ENCOUNTER — Encounter: Payer: Self-pay | Admitting: Family Medicine

## 2024-06-16 DIAGNOSIS — E785 Hyperlipidemia, unspecified: Secondary | ICD-10-CM

## 2024-06-16 DIAGNOSIS — E559 Vitamin D deficiency, unspecified: Secondary | ICD-10-CM

## 2024-06-16 DIAGNOSIS — E039 Hypothyroidism, unspecified: Secondary | ICD-10-CM

## 2024-06-16 DIAGNOSIS — R7303 Prediabetes: Secondary | ICD-10-CM

## 2024-06-16 DIAGNOSIS — Z131 Encounter for screening for diabetes mellitus: Secondary | ICD-10-CM

## 2024-06-16 NOTE — Telephone Encounter (Signed)
 Please review and advise. oncology appointment on 02/09, at 0900 and physical is @ 1300.

## 2024-06-19 ENCOUNTER — Inpatient Hospital Stay: Payer: BC Managed Care – PPO

## 2024-06-19 ENCOUNTER — Encounter: Admitting: Family Medicine

## 2024-06-27 ENCOUNTER — Inpatient Hospital Stay: Payer: BC Managed Care – PPO | Admitting: Hematology and Oncology

## 2024-10-25 ENCOUNTER — Other Ambulatory Visit

## 2024-10-27 ENCOUNTER — Ambulatory Visit: Admitting: Gynecologic Oncology
